# Patient Record
Sex: Male | Born: 1945 | Race: White | Hispanic: No | Marital: Married | Smoking: Never smoker
Health system: Southern US, Community
[De-identification: ages and names within clinical notes are randomized; demographics above are authoritative.]

## PROBLEM LIST (undated history)

## (undated) DIAGNOSIS — E119 Type 2 diabetes mellitus without complications: Secondary | ICD-10-CM

## (undated) DIAGNOSIS — I2510 Atherosclerotic heart disease of native coronary artery without angina pectoris: Secondary | ICD-10-CM

## (undated) DIAGNOSIS — I509 Heart failure, unspecified: Secondary | ICD-10-CM

## (undated) DIAGNOSIS — Q669 Congenital deformity of feet, unspecified, unspecified foot: Secondary | ICD-10-CM

## (undated) DIAGNOSIS — K760 Fatty (change of) liver, not elsewhere classified: Secondary | ICD-10-CM

## (undated) DIAGNOSIS — R600 Localized edema: Secondary | ICD-10-CM

## (undated) DIAGNOSIS — K219 Gastro-esophageal reflux disease without esophagitis: Secondary | ICD-10-CM

## (undated) DIAGNOSIS — G4730 Sleep apnea, unspecified: Secondary | ICD-10-CM

## (undated) DIAGNOSIS — E11319 Type 2 diabetes mellitus with unspecified diabetic retinopathy without macular edema: Secondary | ICD-10-CM

## (undated) DIAGNOSIS — I209 Angina pectoris, unspecified: Secondary | ICD-10-CM

## (undated) DIAGNOSIS — I10 Essential (primary) hypertension: Secondary | ICD-10-CM

## (undated) DIAGNOSIS — I495 Sick sinus syndrome: Secondary | ICD-10-CM

## (undated) DIAGNOSIS — E785 Hyperlipidemia, unspecified: Secondary | ICD-10-CM

## (undated) DIAGNOSIS — D649 Anemia, unspecified: Secondary | ICD-10-CM

## (undated) HISTORY — DX: Hyperlipidemia, unspecified: E78.5

## (undated) HISTORY — PX: CORONARY ANGIOPLASTY WITH STENT PLACEMENT: SHX49

---

## 2004-06-19 ENCOUNTER — Other Ambulatory Visit: Payer: Self-pay

## 2004-06-19 ENCOUNTER — Inpatient Hospital Stay: Payer: Self-pay | Admitting: Cardiology

## 2004-07-22 ENCOUNTER — Encounter: Payer: Self-pay | Admitting: Cardiology

## 2004-08-02 ENCOUNTER — Encounter: Payer: Self-pay | Admitting: Cardiology

## 2004-09-02 ENCOUNTER — Encounter: Payer: Self-pay | Admitting: Cardiology

## 2004-09-04 ENCOUNTER — Ambulatory Visit: Payer: Self-pay | Admitting: Family Medicine

## 2004-10-03 ENCOUNTER — Encounter: Payer: Self-pay | Admitting: Cardiology

## 2004-11-02 ENCOUNTER — Encounter: Payer: Self-pay | Admitting: Cardiology

## 2009-03-14 ENCOUNTER — Ambulatory Visit: Payer: Self-pay | Admitting: Unknown Physician Specialty

## 2009-08-20 ENCOUNTER — Ambulatory Visit: Payer: Self-pay | Admitting: Internal Medicine

## 2009-08-28 ENCOUNTER — Ambulatory Visit: Payer: Self-pay | Admitting: Family Medicine

## 2010-02-28 ENCOUNTER — Ambulatory Visit: Payer: Self-pay | Admitting: Unknown Physician Specialty

## 2012-02-18 ENCOUNTER — Ambulatory Visit: Payer: Self-pay | Admitting: Family Medicine

## 2012-03-05 ENCOUNTER — Ambulatory Visit: Payer: Self-pay | Admitting: Family Medicine

## 2012-03-24 ENCOUNTER — Ambulatory Visit: Payer: Self-pay | Admitting: Unknown Physician Specialty

## 2012-03-25 LAB — PATHOLOGY REPORT

## 2012-03-31 LAB — PSA: PSA: 0.3

## 2012-04-02 ENCOUNTER — Ambulatory Visit: Payer: Self-pay | Admitting: Family Medicine

## 2012-04-21 ENCOUNTER — Ambulatory Visit: Payer: Self-pay | Admitting: Unknown Physician Specialty

## 2012-06-06 ENCOUNTER — Ambulatory Visit: Payer: Self-pay | Admitting: Internal Medicine

## 2012-06-06 LAB — CK TOTAL AND CKMB (NOT AT ARMC)
CK, Total: 142 U/L (ref 35–232)
CK-MB: 2.9 ng/mL (ref 0.5–3.6)

## 2012-06-07 LAB — BASIC METABOLIC PANEL
Anion Gap: 6 — ABNORMAL LOW (ref 7–16)
BUN: 27 mg/dL — ABNORMAL HIGH (ref 7–18)
Calcium, Total: 8.8 mg/dL (ref 8.5–10.1)
Chloride: 100 mmol/L (ref 98–107)
Creatinine: 1.26 mg/dL (ref 0.60–1.30)
Osmolality: 273 (ref 275–301)

## 2012-07-14 ENCOUNTER — Ambulatory Visit: Payer: Self-pay | Admitting: Ophthalmology

## 2012-07-26 ENCOUNTER — Ambulatory Visit: Payer: Self-pay | Admitting: Ophthalmology

## 2012-09-07 ENCOUNTER — Ambulatory Visit: Payer: Self-pay | Admitting: Ophthalmology

## 2012-09-07 LAB — POTASSIUM: Potassium: 4.6 mmol/L (ref 3.5–5.1)

## 2012-09-20 ENCOUNTER — Ambulatory Visit: Payer: Self-pay | Admitting: Ophthalmology

## 2013-10-05 LAB — TSH: TSH: 1.23 u[IU]/mL (ref <=5.90)

## 2013-11-07 LAB — LIPID PANEL
Cholesterol: 105 mg/dL (ref 0–200)
HDL: 41 mg/dL (ref 35–70)
LDL Cholesterol: 39 mg/dL
Triglycerides: 126 mg/dL (ref 40–160)

## 2013-12-30 LAB — COMPREHENSIVE METABOLIC PANEL
ALBUMIN: 2.7 g/dL — AB (ref 3.4–5.0)
AST: 28 U/L (ref 15–37)
Alkaline Phosphatase: 102 U/L
Anion Gap: 11 (ref 7–16)
BUN: 52 mg/dL — ABNORMAL HIGH (ref 7–18)
Bilirubin,Total: 0.5 mg/dL (ref 0.2–1.0)
CALCIUM: 8 mg/dL — AB (ref 8.5–10.1)
CHLORIDE: 96 mmol/L — AB (ref 98–107)
Co2: 24 mmol/L (ref 21–32)
Creatinine: 2.51 mg/dL — ABNORMAL HIGH (ref 0.60–1.30)
GFR CALC AF AMER: 33 — AB
GFR CALC NON AF AMER: 27 — AB
GLUCOSE: 354 mg/dL — AB (ref 65–99)
Osmolality: 291 (ref 275–301)
POTASSIUM: 4.8 mmol/L (ref 3.5–5.1)
SGPT (ALT): 29 U/L
Sodium: 131 mmol/L — ABNORMAL LOW (ref 136–145)
TOTAL PROTEIN: 6.6 g/dL (ref 6.4–8.2)

## 2013-12-30 LAB — URINALYSIS, COMPLETE
BLOOD: NEGATIVE
Bacteria: NONE SEEN
Bilirubin,UR: NEGATIVE
Glucose,UR: 50 mg/dL (ref 0–75)
Hyaline Cast: 58
KETONE: NEGATIVE
LEUKOCYTE ESTERASE: NEGATIVE
Nitrite: NEGATIVE
PH: 5 (ref 4.5–8.0)
Protein: NEGATIVE
Specific Gravity: 1.017 (ref 1.003–1.030)

## 2013-12-30 LAB — CBC
HCT: 26.9 % — ABNORMAL LOW (ref 40.0–52.0)
HGB: 8.7 g/dL — AB (ref 13.0–18.0)
MCH: 27.4 pg (ref 26.0–34.0)
MCHC: 32.3 g/dL (ref 32.0–36.0)
MCV: 85 fL (ref 80–100)
PLATELETS: 267 10*3/uL (ref 150–440)
RBC: 3.17 10*6/uL — AB (ref 4.40–5.90)
RDW: 15.8 % — AB (ref 11.5–14.5)
WBC: 9 10*3/uL (ref 3.8–10.6)

## 2013-12-30 LAB — PRO B NATRIURETIC PEPTIDE: B-Type Natriuretic Peptide: 807 pg/mL — ABNORMAL HIGH (ref 0–125)

## 2013-12-30 LAB — TROPONIN I: Troponin-I: <0.02 ng/mL

## 2013-12-31 ENCOUNTER — Inpatient Hospital Stay: Payer: Self-pay | Admitting: Internal Medicine

## 2013-12-31 LAB — BASIC METABOLIC PANEL
ANION GAP: 9 (ref 7–16)
BUN: 55 mg/dL — AB (ref 7–18)
Calcium, Total: 7.8 mg/dL — ABNORMAL LOW (ref 8.5–10.1)
Chloride: 97 mmol/L — ABNORMAL LOW (ref 98–107)
Co2: 26 mmol/L (ref 21–32)
Creatinine: 2.4 mg/dL — ABNORMAL HIGH (ref 0.60–1.30)
EGFR (African American): 35 — ABNORMAL LOW
EGFR (Non-African Amer.): 29 — ABNORMAL LOW
Glucose: 433 mg/dL — ABNORMAL HIGH (ref 65–99)
OSMOLALITY: 298 (ref 275–301)
POTASSIUM: 5.2 mmol/L — AB (ref 3.5–5.1)
SODIUM: 132 mmol/L — AB (ref 136–145)

## 2013-12-31 LAB — URINALYSIS, COMPLETE
BILIRUBIN, UR: NEGATIVE
Blood: NEGATIVE
KETONE: NEGATIVE
Leukocyte Esterase: NEGATIVE
Nitrite: NEGATIVE
PH: 5 (ref 4.5–8.0)
Protein: NEGATIVE
Specific Gravity: 1.01 (ref 1.003–1.030)

## 2013-12-31 LAB — CBC WITH DIFFERENTIAL/PLATELET
BASOS ABS: 0 10*3/uL (ref 0.0–0.1)
Basophil %: 0.4 %
EOS PCT: 1.6 %
Eosinophil #: 0.2 10*3/uL (ref 0.0–0.7)
HCT: 27 % — ABNORMAL LOW (ref 40.0–52.0)
HGB: 8.5 g/dL — ABNORMAL LOW (ref 13.0–18.0)
Lymphocyte #: 1.2 10*3/uL (ref 1.0–3.6)
Lymphocyte %: 12.2 %
MCH: 26.8 pg (ref 26.0–34.0)
MCHC: 31.5 g/dL — ABNORMAL LOW (ref 32.0–36.0)
MCV: 85 fL (ref 80–100)
Monocyte #: 1.5 x10 3/mm — ABNORMAL HIGH (ref 0.2–1.0)
Monocyte %: 15.7 %
NEUTROS ABS: 6.8 10*3/uL — AB (ref 1.4–6.5)
Neutrophil %: 70.1 %
Platelet: 261 10*3/uL (ref 150–440)
RBC: 3.18 10*6/uL — ABNORMAL LOW (ref 4.40–5.90)
RDW: 15.8 % — ABNORMAL HIGH (ref 11.5–14.5)
WBC: 9.6 10*3/uL (ref 3.8–10.6)

## 2013-12-31 LAB — CK-MB
CK-MB: 2.3 ng/mL (ref 0.5–3.6)
CK-MB: 2 ng/mL (ref 0.5–3.6)
CK-MB: 2.6 ng/mL (ref 0.5–3.6)

## 2013-12-31 LAB — TROPONIN I: Troponin-I: 0.02 ng/mL

## 2013-12-31 LAB — TSH: THYROID STIMULATING HORM: 0.894 u[IU]/mL

## 2014-01-01 LAB — CBC WITH DIFFERENTIAL/PLATELET
BASOS ABS: 0 10*3/uL (ref 0.0–0.1)
Basophil %: 0.4 %
EOS PCT: 2.4 %
Eosinophil #: 0.3 10*3/uL (ref 0.0–0.7)
HCT: 26.5 % — AB (ref 40.0–52.0)
HGB: 8.5 g/dL — AB (ref 13.0–18.0)
LYMPHS ABS: 1.6 10*3/uL (ref 1.0–3.6)
Lymphocyte %: 15.2 %
MCH: 26.9 pg (ref 26.0–34.0)
MCHC: 31.9 g/dL — ABNORMAL LOW (ref 32.0–36.0)
MCV: 84 fL (ref 80–100)
Monocyte #: 1.5 x10 3/mm — ABNORMAL HIGH (ref 0.2–1.0)
Monocyte %: 13.6 %
Neutrophil #: 7.4 10*3/uL — ABNORMAL HIGH (ref 1.4–6.5)
Neutrophil %: 68.4 %
Platelet: 278 10*3/uL (ref 150–440)
RBC: 3.15 10*6/uL — AB (ref 4.40–5.90)
RDW: 15.9 % — AB (ref 11.5–14.5)
WBC: 10.8 10*3/uL — ABNORMAL HIGH (ref 3.8–10.6)

## 2014-01-01 LAB — BASIC METABOLIC PANEL
Anion Gap: 12 (ref 7–16)
BUN: 67 mg/dL — ABNORMAL HIGH (ref 7–18)
Calcium, Total: 7.8 mg/dL — ABNORMAL LOW (ref 8.5–10.1)
Chloride: 98 mmol/L (ref 98–107)
Co2: 25 mmol/L (ref 21–32)
Creatinine: 2.71 mg/dL — ABNORMAL HIGH (ref 0.60–1.30)
EGFR (African American): 30 — ABNORMAL LOW
EGFR (Non-African Amer.): 25 — ABNORMAL LOW
GLUCOSE: 273 mg/dL — AB (ref 65–99)
Osmolality: 299 (ref 275–301)
POTASSIUM: 4.7 mmol/L (ref 3.5–5.1)
Sodium: 135 mmol/L — ABNORMAL LOW (ref 136–145)

## 2014-01-02 LAB — HEMOGLOBIN: HGB: 7.9 g/dL — ABNORMAL LOW (ref 13.0–18.0)

## 2014-01-02 LAB — IRON AND TIBC
IRON BIND. CAP.(TOTAL): 401 ug/dL (ref 250–450)
IRON SATURATION: 12 %
IRON: 47 ug/dL — AB (ref 65–175)
UNBOUND IRON-BIND. CAP.: 354 ug/dL

## 2014-01-02 LAB — BASIC METABOLIC PANEL
ANION GAP: 8 (ref 7–16)
BUN: 62 mg/dL — AB (ref 7–18)
CHLORIDE: 101 mmol/L (ref 98–107)
Calcium, Total: 7.9 mg/dL — ABNORMAL LOW (ref 8.5–10.1)
Co2: 27 mmol/L (ref 21–32)
Creatinine: 1.96 mg/dL — ABNORMAL HIGH (ref 0.60–1.30)
EGFR (Non-African Amer.): 36 — ABNORMAL LOW
GFR CALC AF AMER: 44 — AB
GLUCOSE: 152 mg/dL — AB (ref 65–99)
OSMOLALITY: 293 (ref 275–301)
Potassium: 4.2 mmol/L (ref 3.5–5.1)
Sodium: 136 mmol/L (ref 136–145)

## 2014-01-02 LAB — FERRITIN: FERRITIN (ARMC): 17 ng/mL (ref 8–388)

## 2014-01-02 LAB — PHOSPHORUS: Phosphorus: 3.1 mg/dL (ref 2.5–4.9)

## 2014-01-03 LAB — PROTEIN ELECTROPHORESIS(ARMC)

## 2014-01-05 DIAGNOSIS — I10 Essential (primary) hypertension: Secondary | ICD-10-CM | POA: Insufficient documentation

## 2014-01-05 DIAGNOSIS — N184 Chronic kidney disease, stage 4 (severe): Secondary | ICD-10-CM | POA: Insufficient documentation

## 2014-01-05 DIAGNOSIS — I350 Nonrheumatic aortic (valve) stenosis: Secondary | ICD-10-CM | POA: Insufficient documentation

## 2014-01-05 LAB — UR PROT ELECTROPHORESIS, URINE RANDOM

## 2014-02-28 ENCOUNTER — Ambulatory Visit: Payer: Self-pay | Admitting: Unknown Physician Specialty

## 2014-02-28 LAB — HM COLONOSCOPY

## 2014-03-01 DIAGNOSIS — I480 Paroxysmal atrial fibrillation: Secondary | ICD-10-CM | POA: Insufficient documentation

## 2014-03-29 LAB — HEMOGLOBIN A1C: Hgb A1c MFr Bld: 11.9 % — AB (ref 4.0–6.0)

## 2014-05-21 LAB — CBC AND DIFFERENTIAL
HEMATOCRIT: 41 % (ref 41–53)
Hemoglobin: 13.4 g/dL — AB (ref 13.5–17.5)
PLATELETS: 201 10*3/uL (ref 150–399)
WBC: 9.7 10^3/mL

## 2014-05-21 LAB — BASIC METABOLIC PANEL
BUN: 28 mg/dL — AB (ref 4–21)
Creatinine: 1.2 mg/dL (ref <=1.3)
GLUCOSE: 293 mg/dL
Potassium: 4.3 mmol/L (ref 3.4–5.3)
Sodium: 136 mmol/L — AB (ref 137–147)

## 2014-05-23 DIAGNOSIS — E11319 Type 2 diabetes mellitus with unspecified diabetic retinopathy without macular edema: Secondary | ICD-10-CM | POA: Insufficient documentation

## 2014-05-23 DIAGNOSIS — I10 Essential (primary) hypertension: Secondary | ICD-10-CM | POA: Insufficient documentation

## 2014-05-23 DIAGNOSIS — K760 Fatty (change of) liver, not elsewhere classified: Secondary | ICD-10-CM | POA: Insufficient documentation

## 2014-05-23 DIAGNOSIS — K219 Gastro-esophageal reflux disease without esophagitis: Secondary | ICD-10-CM | POA: Insufficient documentation

## 2014-05-23 DIAGNOSIS — N179 Acute kidney failure, unspecified: Secondary | ICD-10-CM | POA: Insufficient documentation

## 2014-05-23 DIAGNOSIS — L570 Actinic keratosis: Secondary | ICD-10-CM | POA: Insufficient documentation

## 2014-05-23 DIAGNOSIS — I2510 Atherosclerotic heart disease of native coronary artery without angina pectoris: Secondary | ICD-10-CM | POA: Insufficient documentation

## 2014-05-23 DIAGNOSIS — D509 Iron deficiency anemia, unspecified: Secondary | ICD-10-CM | POA: Insufficient documentation

## 2014-05-23 DIAGNOSIS — R945 Abnormal results of liver function studies: Secondary | ICD-10-CM | POA: Insufficient documentation

## 2014-05-23 DIAGNOSIS — F339 Major depressive disorder, recurrent, unspecified: Secondary | ICD-10-CM | POA: Insufficient documentation

## 2014-05-23 DIAGNOSIS — D649 Anemia, unspecified: Secondary | ICD-10-CM | POA: Insufficient documentation

## 2014-05-23 DIAGNOSIS — G4733 Obstructive sleep apnea (adult) (pediatric): Secondary | ICD-10-CM | POA: Insufficient documentation

## 2014-05-23 DIAGNOSIS — Z967 Presence of other bone and tendon implants: Secondary | ICD-10-CM | POA: Insufficient documentation

## 2014-05-23 DIAGNOSIS — E669 Obesity, unspecified: Secondary | ICD-10-CM | POA: Insufficient documentation

## 2014-05-23 DIAGNOSIS — G5630 Lesion of radial nerve, unspecified upper limb: Secondary | ICD-10-CM | POA: Insufficient documentation

## 2014-05-23 DIAGNOSIS — L400 Psoriasis vulgaris: Secondary | ICD-10-CM | POA: Insufficient documentation

## 2014-05-23 DIAGNOSIS — E785 Hyperlipidemia, unspecified: Secondary | ICD-10-CM | POA: Insufficient documentation

## 2014-05-23 DIAGNOSIS — R7989 Other specified abnormal findings of blood chemistry: Secondary | ICD-10-CM | POA: Insufficient documentation

## 2014-05-25 NOTE — Consult Note (Signed)
Brief Consult Note: Diagnosis: SVT/Tachycardia.   Patient was seen by consultant.   Consult note dictated.   Orders entered.   Discussed with Attending MD.   Comments: IMP Tachycardia Palptations SVT (accelarated junctional rhythm) 115 HTN CAD DM Obesity Hyperlipidemia COPD OSA . PLAN Metoprolol 5mg  IV Metoprolol tart 25 mg po x 1 Continue DM control HTN control Continue Statin OK to d/c home F/U with BK/Donna Carrol.  Electronic Signatures: Dorothyann Peng D (MD)  (Signed 24-Jun-14 12:50)  Authored: Brief Consult Note   Last Updated: 24-Jun-14 12:50 by Dorothyann Peng D (MD)

## 2014-05-25 NOTE — Discharge Summary (Signed)
PATIENT NAME:  LARELL, KULPA MR#:  [id number] DATE OF BIRTH:  1945-08-03  DATE OF ADMISSION:  06/06/2012 DATE OF DISCHARGE:  06/07/2012  DATE OF BIRTH: 07/28/1934.   DISCHARGE DIAGNOSES:   1.  Hypertension.  2.  Hyperlipidemia.  3.  Coronary artery disease.   HISTORY: This is a 69 year old male with chest discomfort, progressive in nature, with known coronary artery disease, having a new onset of worsening chest discomfort and an abnormal stress test. The patient had [place] class III anginal equivalent. The patient was on appropriate medication management. He therefore underwent cardiac catheterization showing a significant restenosis of the proximal LAD stent as well as worsening 95% stenosis of the proximal LAD. The patient underwent PCI and stent placement of this area without complication using a drug-eluting stent.   He has reached his maximal hospital benefit and discharged home in good condition. The patient is to follow up for further evaluation and treatment in 2 weeks.   DISCHARGE MEDICATIONS: Effient 10 mg each day, aspirin 81 mg each day, lisinopril  10 mg each day, metoprolol 25 mg b.i.d., with simvastatin 40 mg each day.    ____________________________ Lamar Blinks, MD bjk:dm D: 06/09/2012 13:58:25 ET T: 06/09/2012 14:08:13 ET JOB#: [id number]  cc: Lamar Blinks, MD, <Dictator> Lamar Blinks MD ELECTRONICALLY SIGNED 06/13/2012 13:47

## 2014-05-25 NOTE — Op Note (Signed)
PATIENT NAME:  Jay [name], Jay [name] MR#:  [id number] DATE OF BIRTH:  [birth year]  DATE OF PROCEDURE:  07/26/2012  PREOPERATIVE DIAGNOSIS: Visually significant cataract of the right eye.   POSTOPERATIVE DIAGNOSIS: Visually significant cataract of the right eye.   OPERATIVE PROCEDURE: Cataract extraction by phacoemulsification with implant of intraocular lens to the right eye.   SURGEON: Galen Manila, MD.   ANESTHESIA:  1. Managed anesthesia care.  2. Topical tetracaine drops followed by 2% Xylocaine jelly applied in the preoperative holding area.   COMPLICATIONS: None.   TECHNIQUE: Stop and chop.  DESCRIPTION OF PROCEDURE: The patient was examined and consented in the preoperative holding area where the aforementioned topical anesthesia was applied to the right eye and then brought back to the operating room where the right eye was prepped and draped in the usual sterile ophthalmic fashion and a lid speculum was placed. A paracentesis was created with the side port blade and the anterior chamber was filled with viscoelastic. A near clear corneal incision was performed with the steel keratome. A continuous curvilinear capsulorrhexis was performed with a cystotome followed by the capsulorrhexis forceps. Hydrodissection and hydrodelineation were carried out with BSS on a blunt cannula. The lens was removed in a stop and chop technique and the remaining cortical material was removed with the irrigation-aspiration handpiece. The capsular bag was inflated with viscoelastic and the Tecnis ZCB00 22.0-diopter lens, serial number [id number] was placed in the capsular bag without complication. The remaining viscoelastic was removed from the eye with the irrigation-aspiration handpiece. The wounds were hydrated. The anterior chamber was flushed with Miostat and the eye was inflated to physiologic pressure. 0.1 mL of cefuroxime concentration 10 mg/mL was placed in the anterior chamber. The wounds were found to  be water tight. The eye was dressed with Vigamox. The patient was given protective glasses to wear throughout the day and a shield with which to sleep tonight. The patient was also given drops with which to begin a drop regimen today and will follow-up with me in 1 day.    ____________________________ Jerilee Field. Shiheem Corporan, MD wlp:aw D: 07/26/2012 13:01:00 ET T: 07/26/2012 13:18:58 ET JOB#: [id number]  cc: Minette Manders L. Morry Veiga, MD, <Dictator> Jerilee Field [place] MD ELECTRONICALLY SIGNED 07/28/2012 9:44

## 2014-05-25 NOTE — Op Note (Signed)
PATIENT NAME:  Jay [name], Jay [name] MR#:  [id number] DATE OF BIRTH:  October 30, 1945  DATE OF PROCEDURE:  09/20/2012  PREOPERATIVE DIAGNOSIS: Visually significant cataract of the left eye.   POSTOPERATIVE DIAGNOSIS: Visually significant cataract of the left eye.   OPERATIVE PROCEDURE: Cataract extraction by phacoemulsification with implant of intraocular lens to left eye.   SURGEON: Galen Manila, MD.   ANESTHESIA:  1. Managed anesthesia care.  2. Topical tetracaine drops followed by 2% Xylocaine jelly applied in the preoperative holding area.   COMPLICATIONS: None.   TECHNIQUE:  Stop and chop.   DESCRIPTION OF PROCEDURE: The patient was examined and consented in the preoperative holding area where the aforementioned topical anesthesia was applied to the left eye and then brought back to the Operating Room where the left eye was prepped and draped in the usual sterile ophthalmic fashion and a lid speculum was placed. A paracentesis was created with the side port blade and the anterior chamber was filled with viscoelastic. A near clear corneal incision was performed with the steel keratome. A continuous curvilinear capsulorrhexis was performed with a cystotome followed by the capsulorrhexis forceps. Hydrodissection and hydrodelineation were carried out with BSS on a blunt cannula. The lens was removed in a stop and chop  technique and the remaining cortical material was removed with the irrigation-aspiration handpiece. The capsular bag was inflated with viscoelastic and the Tecnis ZCB00 22.5 diopter lens, serial number [id number] was placed in the capsular bag without complication. The remaining viscoelastic was removed from the eye with the irrigation-aspiration handpiece. The wounds were hydrated. The anterior chamber was flushed with Miostat and the eye was inflated to physiologic pressure. 0.1 mL of cefuroxime concentration 10 mg/mL was placed in the anterior chamber. The wounds were found to be  water tight. The eye was dressed with Vigamox. The patient was given protective glasses to wear throughout the day and a shield with which to sleep tonight. The patient was also given drops with which to begin a drop regimen today and will follow-up with me in one day.     ____________________________ Jerilee Field. Jaquel Glassburn, MD wlp:cc D: 09/20/2012 14:25:43 ET T: 09/20/2012 17:55:53 ET JOB#: [id number]  cc: Levaughn Puccinelli L. Nickolaos Brallier, MD, <Dictator> Jerilee Field Miley Blanchett MD ELECTRONICALLY SIGNED 09/21/2012 13:37

## 2014-05-26 NOTE — Consult Note (Signed)
PATIENT NAME:  Jay [name], ELTING MR#:  [id number] DATE OF BIRTH:  Sep 07, 1945  DATE OF CONSULTATION:  01/01/2014  REQUESTING PHYSICIAN:   Dr. Ellin Mayhew CONSULTING PHYSICIAN:  Lamar Blinks, MD   REASON FOR CONSULTATION: Supraventricular tachycardia, as well as bradycardia with first degree AV block.   CHIEF COMPLAINT: The patient has dizziness and weakness.   HISTORY OF PRESENT ILLNESS: This is a 69 year old male with known essential hypertension, mixed hyperlipidemia, diabetes with complication of cardiovascular disease and renal dysfunction, coronary artery disease status post multiple coronary artery stenting, sleep apnea and diabetes, who has had recent onset of dizziness for the last week.  The patient has had severe generalized weakness, dry mouth with reasonable urine output with an increase in frequency and intensity of this dizziness, but no evidence of syncope not specifically relieved by anything in particular.  Upon arrival, the patient does have an EKG showing sinus bradycardia with first degree AV block in the 40 beat per minute range with some lower blood pressure, but this has slightly improved.  He also, by telemetry has had a short run of supraventricular tachycardia, wide complex of approximately 15 beats. The patient has had this supraventricular tachycardia in the past and has been on a beta blocker in the past but now this has been discontinued.  He has had coronary artery disease with stenting of the left anterior descending artery, right coronary artery in the past, but currently there is no evidence of true angina and/or congestive heart failure.   Troponin level has been normal. There has been sleep apnea for which the patient has not been as diligent as needed for a CPAP machine.  Chronic kidney disease has been exacerbated from diabetes and stage IV for which the patient has had appropriate treatment. He does have lower extremity edema on diuretics with GFR of 30 and a  creatinine of 2.71. Currently, there is anemia, which may be exacerbating some of her symptoms as well, with a hemoglobin of 8.5.  The patient does have mixed hyperlipidemia for which he has been on Crestor and stable.   REVIEW OF SYSTEMS: The remainder review of systems negative for vision change, ringing in the ears, hearing loss, cough, congestion, heartburn, nausea, vomiting, diarrhea, bloody stools, stomach pain, extremity pain, leg weakness, cramping of the buttocks, known blood clots, headaches, nosebleeds, congestion, trouble swallowing, frequent urination, urination at night, muscle weakness, numbness, anxiety, depression, skin lesions or skin rashes.   PAST MEDICAL HISTORY:  1.  Supraventricular tachycardia.  2.  Bradycardia.  3.  Coronary artery disease without angina/ 4.  Sleep apnea, obstructive.  5.  Diabetes with complications of cardiovascular disease and chronic kidney disease.  6.  Mixed hyperlipidemia.  7.  Chronic kidney disease stage IV.   FAMILY HISTORY: Multiple family members with early onset of cardiovascular disease including his father and mother with heart attack and stroke.   SOCIAL HISTORY: He currently denies alcohol or tobacco use.   ALLERGIES: As listed.   MEDICATIONS: As listed.   PHYSICAL EXAMINATION:  VITAL SIGNS: Blood pressure is 101/51 bilaterally. Heart rate is 48 upright, reclining, and regular.  GENERAL: He is a well-appearing large male in no acute distress.  HEENT: No icterus, thyromegaly, ulcers, hemorrhage, or xanthelasma.  CARDIOVASCULAR: Regular rate and rhythm. Distant heart sounds. No apparent murmur, gallop, or rub.  PMI is diffuse.  Carotid upstroke cannot be seen. Jugular venous pressure cannot be seen or heard.  LUNGS: Decreased breath sounds in the bases and  a few wheezes.  ABDOMEN: Soft, nontender. Cannot assess hepatosplenomegaly or masses due to increased abdominal girth.  EXTREMITIES: Showed 2+ radial, no femoral and trace dorsal  pedal pulses, with 1+ lower extremity edema. No cyanosis, clubbing or ulcers.  NEUROLOGIC: The patient is oriented to time, place, and person, with normal mood and affect.   ASSESSMENT: This is a 69 year old male with supraventricular tachycardia, followed by an episode of bradycardia with first degree AV block, coronary artery disease without heart failure or angina, obstructive sleep apnea, mixed hyperlipidemia, diabetes with complications, chronic kidney disease with some dizziness needing further treatment options.   RECOMMENDATIONS:  1.  Discontinuation of metoprolol and no current additional medication management for blood pressure which is currently stable which there are concerns of hypotension causing or exacerbating above.  2.  Further evaluation of anemia which could contribute to dizziness and current symptoms.  3.  Echocardiogram for LV systolic dysfunction, valvular heart disease contributing to above.  4.  Continue aspirin for further risk reduction in cardiovascular event and/or in-stent thrombosis, although potentially may discontinue Plavix due to concerns of bleeding and anemia.  5.  Crestor for high intensity cholesterol therapy.  6.  Begin ambulation with the above following closely for evidence of recurrent supraventricular tachycardia or symptomatic bradycardia requiring pacemaker placement and the patient understands the risks and benefits of pacemaker placement. This includes the possibility of death, stroke, heart attack, infection, bleeding, blood clot, pneumothorax, hemopericardium, and other rhythm disturbances.  The patient is at low risk for conscious sedation.     ____________________________ Lamar Blinks, MD bjk:DT D: 01/01/2014 12:41:13 ET T: 01/01/2014 12:57:14 ET JOB#: [id number]  cc: Lamar Blinks, MD, <Dictator> Lamar Blinks MD ELECTRONICALLY SIGNED 01/05/2014 9:02

## 2014-05-26 NOTE — H&P (Signed)
PATIENT NAME:  Jay [name], Jay [name] MR#:  [id number] DATE OF BIRTH:  1945-10-24  DATE OF ADMISSION:  12/30/2013  PRIMARY CARE PHYSICIAN: Amir L. Sullivan Lone, MD   REFERRING PHYSICIAN: Bobetta Lime A. Inocencio Homes, MD   CHIEF COMPLAINT: Dizziness.   HISTORY OF PRESENT ILLNESS: Jay [name] is a 69 year old, morbidly obese male with a history of hypertension, hyperlipidemia, diabetes mellitus, coronary artery disease status post stent placement, has been experiencing dizziness for the last 1 week. The patient also has been experiencing severe generalized weakness, dry mouth. The patient was urinating well until 2 days back, noticed to have decreased urine output. As the symptoms were getting worse, came to the Emergency Department. Workup in the Emergency Department, the patient is noted to have acute renal failure of 2.5, elevated blood sugars of 350, hemoglobin of 8.7; we do not have any baseline. The patient is also found to have a hemoglobin of 8.7; we do not have the patient's baseline. CT, head, without contrast is unremarkable. EKG shows sinus bradycardia with a heart rate in the 40s. The patient denies having any chest pain or lower extremity swelling.   PAST MEDICAL HISTORY:  1.  Obesity.  2.  Coronary artery disease status post stent placement.  3.  Hypertension.  4.  Hyperlipidemia.  6.  Gastroesophageal reflux disease. 7.  Diabetes mellitus, insulin-dependent.  8.  Obstructive sleep apnea.   ALLERGIES: METFORMIN.   HOME MEDICATIONS:  1.  Omeprazole 20 mg once a day.  2.  Nitroglycerin 0.4 mg sublingual as needed.  3.  Metoprolol 25 mg 2 times a day.  4.  Lantus 72 units with breakfast and 80 units at bedtime. 5.  Januvia 100 mg once a day.  6.  Imdur 30 mg once a day.  7.  Hydrochlorothiazide and lisinopril 1 tablet once a day.  8.  Humalog 62 units with supper.  9.  Glimepiride 4 mg 2 tablets once a day.  10.  Lasix 40 mg once a day. 11.  Crestor 10 mg daily.  12.  Plavix 1 tablet once a  day. 13.  Aspirin 81 mg daily.   SOCIAL HISTORY: No history of smoking, drinking alcohol, or using illicit drugs. Married, lives with his wife. Wife is bed-bound, patient is the primary caregiver.  FAMILY HISTORY: Both parents died in their 71s and 26s from heart problems.   REVIEW OF SYSTEMS:  CONSTITUTIONAL: Experiencing severe generalized weakness.  EYES: No change in vision.  ENT: No change in hearing.   RESPIRATORY: Has no cough. Has shortness of breath with exertion.  CARDIOVASCULAR: Dizziness.  GASTROINTESTINAL: No nausea or vomiting. Has been having diarrhea.  GENITOURINARY: Decreased urine output.  MUSCULOSKELETAL: Has osteoarthritis.  NEUROLOGIC: No weakness or numbness in any part of the body.   PHYSICAL EXAMINATION:  GENERAL: This is a well-built, well-nourished, obese male who looks older than his staged age, lying down in the bed, not in distress.  VITAL SIGNS: Temperature 97.1, pulse 47, blood pressure 112/43, respiratory rate of 20, oxygen saturation is 100% on 2 L of oxygen.  HEENT: Head normocephalic, atraumatic. There is no scleral icterus. Conjunctivae normal. Pupils equal and reactive to light. Mucous membranes dry. No pharyngeal erythema.  NECK: Supple. No lymphadenopathy. No JVD. No carotid bruit.  CHEST: There is no focal tenderness. Decreased breath sounds in the lower lobes.  HEART: S1, S2 regular. No murmurs are heard.  ABDOMEN: Bowel sounds plus. Soft, nontender, nondistended, obese abdomen. No hepatosplenomegaly.  EXTREMITIES: No pedal edema. Pulses 2+.  SKIN: No rash or lesions.  MUSCULOSKELETAL: Good range of motion of all extremities.   NEUROLOGIC: Patient is alert, oriented to place, person, and time. Cranial nerves II through XII intact. Motor is 5/5 in upper and lower extremities.   LABORATORY DATA: Urinalysis negative for nitrites and leukocyte esterase. CT, head, without contrast: No acute intracranial abnormality. Chest x-ray, 1 view portable:  Cardiomegaly. CMP: BUN 52, creatinine of 2.51, sodium 131. The rest of all the values are within normal limits. Hemoglobin 8.7. Troponin less than 0.02. BNP 807.   EKG, 12-lead: Sinus bradycardia with first-degree AV block.    ASSESSMENT AND PLAN: Jay [name], a 69 year old male, comes with multiple abnormalities.  1.  Dizziness: This seems to be a combination of causes: Dehydration; sinus bradycardia, symptomatic. Hold the metoprolol. CT, head, is without contrast: No abnormalities. No obvious any neurological deficits. Will also check orthostatic blood pressure. Will keep the patient on normal saline at 75 mL/hr. 2.  Acute renal failure: This is a combination of hydrochlorothiazide and lisinopril. Hold both the medications. Continue with intravenous fluids and follow up.  3.  Anemia: The patient has a hemoglobin of 8.7. The patient has a history of chronic kidney disease. This could be contributing to low hemoglobin. Will transfuse 1 unit of packed red blood cells; however, this does not seem to be cause of his dizziness. Will do the workup with iron profile, B12, folate RBC. The patient states had a colonoscopy 2 years back, showed some polyps. Denies having any blood in the stool.  4.  Debility: Will involve with physical therapy, occupational therapy.  5.  Diabetes mellitus, poorly controlled: Will obtain hemoglobin A1c. The patient is on high-dose insulin. Will follow up.  6.  Deep vein thrombosis prophylaxis with heparin.   TIME SPENT: 55 minutes.   ____________________________ Susa Griffins, MD pv:ST D: 12/30/2013 23:14:58 ET T: 12/30/2013 23:40:11 ET JOB#: [id number]  cc: Susa Griffins, MD, <Dictator> Jayzen L. Sullivan Lone, MD Clerance Lav Thatcher Doberstein MD ELECTRONICALLY SIGNED 01/12/2014 21:29

## 2014-05-26 NOTE — Discharge Summary (Signed)
PATIENT NAME:  Jay [name], [place]ULEN[place]G MR#:  [id number] DATE OF BIRTH:  Feb 09, 1945  DATE OF ADMISSION:  12/31/2013 DATE OF DISCHARGE:  01/02/2014  ADMISSION DIAGNOSES:   1.  Dizziness.  2.  Bradycardia.  3.  Acute on chronic iron deficiency anemia.  4.  Acute renal failure.   CONSULTATIONS:  1.  Arnoldo Hooker, MD  2.  Munsoor Lateef, MD   LABORATORIES AT DISCHARGE:  Sodium 136, potassium 4.2, chloride 101, bicarbonate 27, BUN 62, creatinine 1.96, glucose is 152. Ferritin 17, iron saturation 12. Iron serum 47, TIBC 401, UIBC 354, phosphorus 3.1, hemoglobin is 7.9.   A 2-D echocardiogram showed normal ejection fraction of 55% to 60% with mild mitral valve regurgitation. Mild to moderate aortic valve stenosis.   Renal ultrasound was normal.   BRIEF HOSPITAL COURSE:  A 69 year old male presenting with dizziness, found to have bradycardia and acute renal failure. For further details, please refer to the H and P.  1.  Dizziness. I suspect this is a combination of bradycardia also anemia, acute on chronic.  The patient is no longer dizzy.  He is ambulating without any symptoms. We held his beta blocker and his heart rate has subsequently improved.  2.  Acute renal failure, thought to be medication-induced.  His hydrochlorothiazide and ACE inhibitor are on hold.  He had acute renal failure secondary to ATN.  His creatinine is improving. Renal ultrasound was normal. No evidence of hydronephrosis. The patient will follow up with Dr. Cherylann Ratel, the nephrologist who saw me in consultation in the hospital in 1 week. 3.  Hyperkalemia resolved.  This was secondary to problem #2.  4.  Bradycardia; we are holding metoprolol and heart rate has improved. Cardiology consult was appreciated. Echo showed normal ejection fraction. No major valvular disease. 5.   Anemia, iron deficient acute on chronic.  The patient's hemoglobin did drop to 7.9.  He had a colonoscopy about 2 years ago which he says his normal.  He has  laboratories consistent with iron deficiency. He says he is taking iron supplement at home.  He is also complaining of dyspnea on exertion and fatigue, so we will go ahead and transfuse him 1 unit prior to discharge.  6.  Hyponatremia from dehydration and Lasix.  This has improved with IV fluids, holding Lasix. 7.  History of coronary artery disease. The patient will continue aspirin and/or Plavix. 8.  Diabetes. The patient will continue with his insulin and ADA diet.  9.  There are no mention of complication.  10. Obstructive sleep apnea.  The patient is not compliant with his CPAP and discussed that he should follow-up with a dentist possibly for a mouth guard.  This may help him since he does not like the CPAP machine.   DISCHARGE MEDICATIONS:  1.  Omeprazole 20 mg daily. 2.  Plavix 75 mg daily.  3.  Symbicort daily.  4.  Imdur 30 mg daily.  5.  Aspirin 81 mg daily. 6.  Crestor 10 mg daily.  7.  Nitroglycerin sublingual p.r.n. chest pain.  8.  TriCor 1 tablet daily.  9.  Glimepiride 8 mg daily 10. InsulinLevemir  80 units at bedtime.  11. Insulin Levemir 72 units daily.  12. Tradjenta 5 mg daily.  13. Ferrous sulfate 325 b.i.d.   DISCHARGE DIET: Low sodium.   DISCHARGE ACTIVITY: As tolerated.   DISCHARGE FOLLOW-UP:  The patient will follow up with Dr. Cherylann Ratel in 1 week, Dr. Gwen Pounds in 1 week and Dr. Gerlene Burdock  Sullivan Lone in 1 week.    TIME SPENT: Approximately 35 minutes.     ____________________________ Janyth Contes. Juliene Pina, MD spm:DT D: 01/02/2014 12:29:01 ET T: 01/02/2014 13:44:27 ET JOB#: [id number]  cc: Elyan Vanwieren P. Juliene Pina, MD, <Dictator> Madex L. Sullivan Lone, MD Lamar Blinks, MD Munsoor Lizabeth Leyden, MD Janyth Contes Kenza Munar MD ELECTRONICALLY SIGNED 01/02/2014 20:46

## 2014-06-03 NOTE — Consult Note (Signed)
PATIENT NAME:  Jay [name], Jay [name] MR#:  [id number] DATE OF BIRTH:  [birth year]  DATE OF CONSULTATION:  02/28/2014  REFERRING PHYSICIAN:  Scot Jun, MD CONSULTING PHYSICIAN:  Lamar Blinks, MD  REASON FOR CONSULTATION: Atrial fibrillation.   CHIEF COMPLAINT: Shortness of breath.  HISTORY OF PRESENT ILLNESS: This is a 69 year old male with moderate aortic valve stenosis, hypertension, hyperlipidemia and coronary artery disease, for which he has had a colonoscopy. The patient had a colonoscopy, and had some AVMs needed some cautery. The patient has had done fairly well, other than the fact that he has had new onset of supraventricular tachycardia, consistent with atrial fibrillation with rapid ventricular rate. The patient had previously been on a beta blocker for this issue, and has had an episode for which he had bradycardia, and this was discontinued. Due to that issue, and his current stressful condition, he has atrial fibrillation with rapid ventricular rate. He has had some short runs where he has had some spontaneous conversion back to normal rhythm with preventricular contractions. Currently, he is asymptomatic. The remainder of the review of systems is negative for vision change, ringing in the ears, hearing loss, cough, congestion, heartburn, nausea, vomiting, diarrhea, stomach pain, extremity pain, leg weakness, cramping of the buttocks, known blood clots, headaches, blackouts, dizzy spells, nosebleeds, congestion, trouble swallowing, frequent urination, urination at night, muscle weakness, numbness, anxiety, depression, skin lesions or skin rashes.   PAST MEDICAL HISTORY: Essential hypertension, mixed hyperlipidemia, moderate aortic stenosis and coronary artery disease.   PHYSICAL EXAMINATION:  VITAL SIGNS: Blood pressure is 132/68, bilateral. Heart rate is 110 and irregular.  GENERAL: He is a well-appearing male, in no acute distress.  HEAD, EYES, EARS, NOSE, THROAT: No icterus,  thyromegaly, ulcers, hemorrhage, or xanthelasma.  CARDIOVASCULAR: Irregularly irregular, with normal S1 and a soft S2, with a 3/6 right upper sternal border murmur radiating throughout. PMI is inferiorly displaced. Carotid upstroke normal with murmur and radiation. Jugular venous pressure not seen.  LUNGS: Clear to auscultation with normal respirations.  ABDOMEN: Soft, nontender, with distention due to colonoscopy. No apparent hepatosplenomegaly or masses.  EXTREMITIES: 2+ radial, femoral, dorsal and pedal pulses, with no lower extremity edema, cyanosis, clubbing, or ulcers.  NEUROLOGIC: He is oriented to time, place, and person, with normal mood and affect.   ASSESSMENT: This is a 69 year old male with moderate aortic stenosis, nonrheumatic; essential hypertension, coronary artery disease without angina, mixed hyperlipidemia, having atrial fibrillation with rapid ventricular rate, nonvalvular.   RECOMMENDATIONS:  1.  Metoprolol 25 mg for heart rate control and maintenance of normal rhythm.  2.  No anticoagulation at this time, due to potential spontaneous conversion and concerns of bleeding risks after a colonoscopy and intervention.  3.  Hypertension control with ACE inhibitor if necessary.  4.  No further intervention of coronary artery disease and aortic valve stenosis; stable at this time.  5.  Patient okay for discharge to home after colonoscopy.  6.  Follow up in 2 days for further adjustments of medications.  ____________________________ Lamar Blinks, MD bjk:MT D: 02/28/2014 10:55:00 ET T: 02/28/2014 11:18:49 ET JOB#: [id number]  cc: Lamar Blinks, MD, <Dictator> Lamar Blinks MD ELECTRONICALLY SIGNED 03/02/2014 8:36

## 2014-07-09 ENCOUNTER — Ambulatory Visit: Payer: Self-pay | Admitting: Family Medicine

## 2014-08-10 ENCOUNTER — Encounter: Payer: Self-pay | Admitting: General Practice

## 2014-08-10 ENCOUNTER — Emergency Department
Admission: EM | Admit: 2014-08-10 | Discharge: 2014-08-10 | Disposition: A | Discharge status: Home / Self Care | Payer: PPO | Attending: Emergency Medicine | Admitting: Emergency Medicine

## 2014-08-10 DIAGNOSIS — Y998 Other external cause status: Secondary | ICD-10-CM | POA: Insufficient documentation

## 2014-08-10 DIAGNOSIS — Z79899 Other long term (current) drug therapy: Secondary | ICD-10-CM | POA: Insufficient documentation

## 2014-08-10 DIAGNOSIS — Z7982 Long term (current) use of aspirin: Secondary | ICD-10-CM | POA: Diagnosis not present

## 2014-08-10 DIAGNOSIS — R739 Hyperglycemia, unspecified: Secondary | ICD-10-CM

## 2014-08-10 DIAGNOSIS — X58XXXA Exposure to other specified factors, initial encounter: Secondary | ICD-10-CM | POA: Insufficient documentation

## 2014-08-10 DIAGNOSIS — L03115 Cellulitis of right lower limb: Secondary | ICD-10-CM | POA: Diagnosis not present

## 2014-08-10 DIAGNOSIS — Z794 Long term (current) use of insulin: Secondary | ICD-10-CM | POA: Diagnosis not present

## 2014-08-10 DIAGNOSIS — S80811A Abrasion, right lower leg, initial encounter: Secondary | ICD-10-CM | POA: Insufficient documentation

## 2014-08-10 DIAGNOSIS — Y9289 Other specified places as the place of occurrence of the external cause: Secondary | ICD-10-CM | POA: Insufficient documentation

## 2014-08-10 DIAGNOSIS — Y9389 Activity, other specified: Secondary | ICD-10-CM | POA: Diagnosis not present

## 2014-08-10 DIAGNOSIS — E1165 Type 2 diabetes mellitus with hyperglycemia: Secondary | ICD-10-CM | POA: Diagnosis not present

## 2014-08-10 HISTORY — DX: Type 2 diabetes mellitus without complications: E11.9

## 2014-08-10 LAB — CBC WITH DIFFERENTIAL/PLATELET
Basophils Absolute: 0 10*3/uL (ref 0–0.1)
Basophils Relative: 0 %
Eosinophils Absolute: 0.2 10*3/uL (ref 0–0.7)
Eosinophils Relative: 2 %
HCT: 38.6 % — ABNORMAL LOW (ref 40.0–52.0)
Hemoglobin: 12.9 g/dL — ABNORMAL LOW (ref 13.0–18.0)
Lymphocytes Relative: 13 %
Lymphs Abs: 1.2 10*3/uL (ref 1.0–3.6)
MCH: 30.5 pg (ref 26.0–34.0)
MCHC: 33.3 g/dL (ref 32.0–36.0)
MCV: 91.5 fL (ref 80.0–100.0)
Monocytes Absolute: 1 10*3/uL (ref 0.2–1.0)
Monocytes Relative: 10 %
Neutro Abs: 7.3 10*3/uL — ABNORMAL HIGH (ref 1.4–6.5)
Neutrophils Relative %: 75 %
Platelets: 176 10*3/uL (ref 150–440)
RBC: 4.22 MIL/uL — ABNORMAL LOW (ref 4.40–5.90)
RDW: 14.1 % (ref 11.5–14.5)
WBC: 9.8 10*3/uL (ref 3.8–10.6)

## 2014-08-10 LAB — URINALYSIS COMPLETE WITH MICROSCOPIC (ARMC ONLY)
Bacteria, UA: NONE SEEN
Bilirubin Urine: NEGATIVE
Glucose, UA: >500 mg/dL — AB
Ketones, ur: NEGATIVE mg/dL
Nitrite: NEGATIVE
Protein, ur: NEGATIVE mg/dL
Specific Gravity, Urine: 1.012 (ref 1.005–1.030)
Squamous Epithelial / HPF: NONE SEEN
pH: 7 (ref 5.0–8.0)

## 2014-08-10 LAB — BASIC METABOLIC PANEL
Anion gap: 8 (ref 5–15)
BUN: 29 mg/dL — AB (ref 6–20)
CALCIUM: 9.1 mg/dL (ref 8.9–10.3)
CO2: 31 mmol/L (ref 22–32)
CREATININE: 1.28 mg/dL — AB (ref 0.61–1.24)
Chloride: 92 mmol/L — ABNORMAL LOW (ref 101–111)
GFR calc Af Amer: >60 mL/min (ref >60)
GFR, EST NON AFRICAN AMERICAN: 56 mL/min — AB (ref >60)
Glucose, Bld: 465 mg/dL — ABNORMAL HIGH (ref 65–99)
POTASSIUM: 4.1 mmol/L (ref 3.5–5.1)
SODIUM: 131 mmol/L — AB (ref 135–145)

## 2014-08-10 LAB — GLUCOSE, CAPILLARY
GLUCOSE-CAPILLARY: 487 mg/dL — AB (ref 65–99)
Glucose-Capillary: 365 mg/dL — ABNORMAL HIGH (ref 65–99)

## 2014-08-10 MED ORDER — INSULIN ASPART 100 UNIT/ML ~~LOC~~ SOLN
5.0000 [IU] | Freq: Once | SUBCUTANEOUS | Status: AC
  Administered 2014-08-10: 5 [IU] via INTRAVENOUS
  Filled 2014-08-10: qty 5

## 2014-08-10 MED ORDER — VANCOMYCIN HCL 10 G IV SOLR
2000.0000 mg | Freq: Once | INTRAVENOUS | Status: AC
  Administered 2014-08-10: 2000 mg via INTRAVENOUS
  Filled 2014-08-10: qty 2000

## 2014-08-10 MED ORDER — SULFAMETHOXAZOLE-TRIMETHOPRIM 800-160 MG PO TABS: 2.0000 | ORAL_TABLET | Freq: Two times a day (BID) | ORAL | Status: DC

## 2014-08-10 MED ORDER — SODIUM CHLORIDE 0.9 % IV BOLUS (SEPSIS)
1000.0000 mL | Freq: Once | INTRAVENOUS | Status: AC
  Administered 2014-08-10: 1000 mL via INTRAVENOUS

## 2014-08-10 MED ORDER — CEPHALEXIN 500 MG PO CAPS: 500.0000 mg | ORAL_CAPSULE | Freq: Four times a day (QID) | ORAL | Status: DC

## 2014-08-10 NOTE — ED Notes (Signed)
POC CBG results= 487 mg/dL. MD notified.

## 2014-08-10 NOTE — ED Notes (Signed)
Pt presents to ED with c/o redness and burning to RLE d/t an injury  He sustained last Monday. Per the pt, he slipped on a step going into his shed and scraped the skin on his right shin. A moderate area of redness is appreciated on the right shin, as well as an approximately 1" long by 5/8" wide area of eschar. Pt rates the pain a 8/10 in intensity.

## 2014-08-10 NOTE — ED Notes (Signed)
Pt to ED c/o redness to R lower leg with noted swelling, pt is a known diabetic.

## 2014-08-10 NOTE — ED Provider Notes (Signed)
[facility] Emergency Department Provider Note  ____________________________________________  Time seen: Approximately 1230  I have reviewed the triage vital signs and the nursing notes.   HISTORY  Chief Complaint Cellulitis    HPI Jay [name] is a 69 y.o. male with a history of diabetes and CAD who presents today with right lower extremity cellulitis. He says that he scraped his right shin on metal this past Monday and since then he has been having swelling and redness surrounding the wound. He denies any drainage of pus.  Says that his last tetanus shot was 2 years ago. Denies any fevers, nausea or vomiting.   Past Medical History  Diagnosis Date  . Diabetes mellitus without complication     Patient Active Problem List   Diagnosis Date Noted  . Acute kidney failure 05/23/2014  . Actinic keratosis 05/23/2014  . Absolute anemia 05/23/2014  . CAD in native artery 05/23/2014  . Depression, major, recurrent 05/23/2014  . DM (diabetes mellitus), secondary 05/23/2014  . Diabetic retinopathy 05/23/2014  . Abnormal LFTs 05/23/2014  . Fatty infiltration of liver 05/23/2014  . Acid reflux 05/23/2014  . BP (high blood pressure) 05/23/2014  . HLD (hyperlipidemia) 05/23/2014  . Anemia, iron deficiency 05/23/2014  . Metal bone fixation hardware in place 05/23/2014  . Adiposity 05/23/2014  . Obstructive apnea 05/23/2014  . Plaque psoriasis 05/23/2014  . Radial nerve disease 05/23/2014    Past Surgical History  Procedure Laterality Date  . Coronary angioplasty with stent placement  2006 and 2008    Current Outpatient Rx  Name  Route  Sig  Dispense  Refill  . aspirin 81 MG tablet   Oral   Take 1 tablet by mouth daily.         . ferrous sulfate 325 (65 FE) MG tablet   Oral   Take 1 tablet by mouth 2 (two) times daily.         Marland Kitchen glimepiride (AMARYL) 4 MG tablet   Oral   Take 2 tablets by mouth 2 (two) times daily.         . Insulin  Detemir (LEVEMIR) 100 UNIT/ML Pen   Subcutaneous   Inject 80 Units into the skin at bedtime.         . isosorbide mononitrate (IMDUR) 30 MG 24 hr tablet   Oral   Take 1 tablet by mouth daily.         Marland Kitchen linagliptin (TRADJENTA) 5 MG TABS tablet   Oral   Take 1 tablet by mouth daily.         . meclizine (ANTIVERT) 25 MG tablet   Oral   Take 1 tablet by mouth 3 (three) times daily.         . metoprolol tartrate (LOPRESSOR) 25 MG tablet   Oral   Take 0.5 tablets by mouth daily.         . mometasone (ELOCON) 0.1 % cream   Topical   Apply 1 application topically daily. For 2 weeks         . nitroGLYCERIN (NITROSTAT) 0.4 MG SL tablet   Sublingual   Place 1 tablet under the tongue as needed. At onset of pain, may repeat in 5 minutes x 3         . pantoprazole (PROTONIX) 40 MG tablet   Oral   Take 1 tablet by mouth 2 (two) times daily.         . rosuvastatin (CRESTOR) 10 MG tablet  Oral   Take 1 tablet by mouth daily.         . sertraline (ZOLOFT) 50 MG tablet   Oral   Take 1 tablet by mouth daily.         Marland Kitchen torsemide (DEMADEX) 20 MG tablet   Oral   Take 1 tablet by mouth 2 (two) times daily.           Allergies Review of patient's allergies indicates no known allergies.  Family History  Problem Relation Age of Onset  . Leukemia Mother   . Cancer Father   . Deep vein thrombosis Father   . Psychiatric Illness Brother   . Heart disease Brother     Social History History  Substance Use Topics  . Smoking status: Not on file  . Smokeless tobacco: Not on file  . Alcohol Use: Not on file    Review of Systems Constitutional: No fever/chills Eyes: No visual changes. ENT: No sore throat. Cardiovascular: Denies chest pain. Respiratory: Denies shortness of breath. Gastrointestinal: No abdominal pain.  No nausea, no vomiting.  No diarrhea.  No constipation. Genitourinary: Negative for dysuria. Musculoskeletal: Negative for back pain. Skin: As  above  Neurological: Negative for headaches, focal weakness or numbness.  10-point ROS otherwise negative.  ____________________________________________   PHYSICAL EXAM:  VITAL SIGNS: ED Triage Vitals  Enc Vitals Group     BP 08/10/14 0052 165/74 mmHg     Pulse Rate 08/10/14 0052 77     Resp 08/10/14 0052 20     Temp 08/10/14 0052 98.3 F (36.8 C)     Temp Source 08/10/14 0052 Oral     SpO2 08/10/14 0052 99 %     Weight 08/10/14 0052 270 lb (122.471 kg)     Height 08/10/14 0052  (1.753 m)     Head Cir --      Peak Flow --      Pain Score 08/10/14 0053 10     Pain Loc --      Pain Edu? --      Excl. in GC? --     Constitutional: Alert and oriented. Well appearing and in no acute distress. Eyes: Conjunctivae are normal. PERRL. EOMI. Head: Atraumatic. Nose: No congestion/rhinnorhea. Mouth/Throat: Mucous membranes are moist.  Oropharynx non-erythematous. Neck: No stridor.   Cardiovascular: Normal rate, regular rhythm. Grossly normal heart sounds.  Good peripheral circulation. Respiratory: Normal respiratory effort.  No retractions. Lungs CTAB. Gastrointestinal: Soft and nontender. No distention. No abdominal bruits. No CVA tenderness. Musculoskeletal: No lower extremity tenderness nor edema.  No joint effusions. Neurologic:  Normal speech and language. No gross focal neurologic deficits are appreciated. Speech is normal. No gait instability. Skin:  Right distal and anterior shin with a 1 x 3 cm abrasion with overlying scab. There is no fluctuance. There is a 5 cm wheal of erythema surrounding which is raised. Mild induration. No pus expressed. Tenderness to palpation and warm.  Psychiatric: Mood and affect are normal. Speech and behavior are normal.  ____________________________________________   LABS (all labs ordered are listed, but only abnormal results are displayed)  Labs Reviewed  GLUCOSE, CAPILLARY - Abnormal; Notable for the following:    Glucose-Capillary  487 (*)    All other components within normal limits  CBC WITH DIFFERENTIAL/PLATELET - Abnormal; Notable for the following:    RBC 4.22 (*)    Hemoglobin 12.9 (*)    HCT 38.6 (*)    Neutro Abs 7.3 (*)  All other components within normal limits  BASIC METABOLIC PANEL - Abnormal; Notable for the following:    Sodium 131 (*)    Chloride 92 (*)    Glucose, Bld 465 (*)    BUN 29 (*)    Creatinine, Ser 1.28 (*)    GFR calc non Af Amer 56 (*)    All other components within normal limits  URINALYSIS COMPLETEWITH MICROSCOPIC ([facility] ONLY) - Abnormal; Notable for the following:    Color, Urine STRAW (*)    APPearance CLEAR (*)    Glucose, UA >500 (*)    Hgb urine dipstick 1+ (*)    Leukocytes, UA TRACE (*)    All other components within normal limits  GLUCOSE, CAPILLARY - Abnormal; Notable for the following:    Glucose-Capillary 365 (*)    All other components within normal limits  CBG MONITORING, ED   ____________________________________________  EKG   ____________________________________________  RADIOLOGY   ____________________________________________   PROCEDURES    ____________________________________________   INITIAL IMPRESSION / ASSESSMENT AND PLAN / ED COURSE  Pertinent labs & imaging results that were available during my care of the patient were reviewed by me and considered in my medical decision making (see chart for details).  ----------------------------------------- 4:38 AM on 08/10/2014 -----------------------------------------  Patient resting comfortably in glucose now down to 365. We'll discharge to home on by mouth antibiotics. Patient says that hasn't been sleeping well over the past several days and that is why he thinks his sugar is high.  ___ I counseled him to drink plenty of water and also reduces sugary food intake to help with wound healing. Patient understands the plan and is willing to  comply._________________________________________   FINAL CLINICAL IMPRESSION(S) / ED DIAGNOSES  Acute cellulitis. Acute hyperglycemia. Initial visit.     Myrna Blazer, MD 08/10/14 [phone]

## 2014-08-10 NOTE — Discharge Instructions (Signed)

## 2014-08-17 ENCOUNTER — Ambulatory Visit (INDEPENDENT_AMBULATORY_CARE_PROVIDER_SITE_OTHER): Payer: PPO | Admitting: Family Medicine

## 2014-08-17 ENCOUNTER — Encounter: Payer: Self-pay | Admitting: Family Medicine

## 2014-08-17 VITALS — BP 112/60 | HR 75 | Temp 97.6°F | Resp 16 | Wt 274.0 lb

## 2014-08-17 DIAGNOSIS — T148 Other injury of unspecified body region: Secondary | ICD-10-CM

## 2014-08-17 DIAGNOSIS — T798XXA Other early complications of trauma, initial encounter: Secondary | ICD-10-CM | POA: Diagnosis not present

## 2014-08-17 DIAGNOSIS — T148XXA Other injury of unspecified body region, initial encounter: Secondary | ICD-10-CM

## 2014-08-17 DIAGNOSIS — L089 Local infection of the skin and subcutaneous tissue, unspecified: Secondary | ICD-10-CM | POA: Insufficient documentation

## 2014-08-17 MED ORDER — CEFTRIAXONE SODIUM 1 G IJ SOLR
1.0000 g | Freq: Once | INTRAMUSCULAR | Status: AC
  Administered 2014-08-17: 1 g via INTRAMUSCULAR

## 2014-08-17 MED ORDER — SODIUM CHLORIDE 0.9 % EX SOLN: 2.0000 "application " | Freq: Two times a day (BID) | CUTANEOUS | Status: DC

## 2014-08-17 NOTE — Patient Instructions (Addendum)
Clean wound twice daily with saline and apply dressing (keep wound moist) and bandage.

## 2014-08-17 NOTE — Progress Notes (Signed)
Subjective:     Patient ID: Jay [name], male   DOB: August 08, 1945, 69 y.o.   MRN: [id number]  HPI  Chief Complaint  Patient presents with  . [name]    Patient presents in office today with concerns of leg infection. He states about a week ago he had slipped and fell while trying to walk into his shed. Patient reports that he was see yesterday at [facility] ER and was treated and prescribed antibiotic, patient reports leg is still very painful and swollen.  He was initially seen in the [facility] ER 7/8 and placed on dual abx therapy for cellulitis. Patient states wound has gotten larger and more painful.   Review of Systems  Constitutional: Negative for fever and chills.       Objective:   Physical Exam  Constitutional: He appears well-developed and well-nourished. No distress.  Skin:  Distal lower extremity wound with swelling, large inflammatory flare and a central dark eschar. Procedure note; Cleansed with Betadine and eschar partially debrided before a moderate amount of dark blood with clots expressed. Swelling resolved with release of hematoma. Cleansed with saline and dressing/bandage applied.       Assessment:    1. Wound infection, initial encounter - cefTRIAXone (ROCEPHIN) injection 1 g; Inject 1 g into the muscle once. - SODIUM CHLORIDE, EXTERNAL, ([facility] SALINE WOUND WASH) 0.9 % SOLN; Apply 2 application topically 2 (two) times daily.  Dispense: 210 mL; Refill: 0 - Debridement  2. Hematoma - Debridement    Plan:    Return on 7/18 and complete current course of abx. Twice daily cleansing with saline and application of dressing.

## 2014-08-20 ENCOUNTER — Encounter: Payer: Self-pay | Admitting: Family Medicine

## 2014-08-20 ENCOUNTER — Ambulatory Visit (INDEPENDENT_AMBULATORY_CARE_PROVIDER_SITE_OTHER): Payer: PPO | Admitting: Family Medicine

## 2014-08-20 VITALS — BP 114/60 | HR 89 | Temp 97.6°F | Resp 16 | Wt 270.8 lb

## 2014-08-20 DIAGNOSIS — N189 Chronic kidney disease, unspecified: Secondary | ICD-10-CM

## 2014-08-20 DIAGNOSIS — E1122 Type 2 diabetes mellitus with diabetic chronic kidney disease: Secondary | ICD-10-CM

## 2014-08-20 DIAGNOSIS — T798XXD Other early complications of trauma, subsequent encounter: Secondary | ICD-10-CM

## 2014-08-20 LAB — GLUCOSE, POCT (MANUAL RESULT ENTRY): POC GLUCOSE: 410 mg/dL — AB (ref 70–99)

## 2014-08-20 MED ORDER — SULFAMETHOXAZOLE-TRIMETHOPRIM 800-160 MG PO TABS: 2.0000 | ORAL_TABLET | Freq: Two times a day (BID) | ORAL | Status: DC

## 2014-08-20 MED ORDER — CEPHALEXIN 500 MG PO CAPS: 500.0000 mg | ORAL_CAPSULE | Freq: Four times a day (QID) | ORAL | Status: AC

## 2014-08-20 NOTE — Progress Notes (Signed)
Subjective:     Patient ID: Jay [name], male   DOB: 1945/09/21, 69 y.o.   MRN: [id number]  HPI  Chief Complaint  Patient presents with  . Wound Check    Patient is here for follow up from 08/17/14, patient had [name] a week ago while going into his shed. Patient suffered cutt to right side of his leg, rocephin 1gm was administered and antibiotics prescribed.   States it is less red and swollen. He has nearly completed course of his dual antibiotic therapy and has been cleansing his wound with saline as directed.Concerned that his sugar may be elevated today. He has not been on tradjenta due to $ concerns pending receipt of his check this week.   Review of Systems  Constitutional: Negative for fever and chills.       Objective:   Physical Exam  Constitutional: He appears well-developed and well-nourished. No distress.  Skin:  Less swollen than on 7/15 with fading erythema though still present. Remains painful with serosanguinous drainage. Partial eschar still present.  Co-examined with Dr. Sullivan Lone.    Assessment:    1. Wound infection, subsequent encounter - cephALEXin (KEFLEX) 500 MG capsule; Take 1 capsule (500 mg total) by mouth 4 (four) times daily.  Dispense: 40 capsule; Refill: 0 - sulfamethoxazole-trimethoprim (BACTRIM DS,SEPTRA DS) 800-160 MG per tablet; Take 2 tablets by mouth 2 (two) times daily.  Dispense: 40 tablet; Refill: 0  2. Type 2 diabetes mellitus with diabetic chronic kidney disease - POCT glucose (manual entry)    Plan:    Continue wound care with saline cleansing and dressing, elevation of his extremity, and continued course of dual abx therapy. Will increased Levemir to 90 units daily. May need split dosing soon. F/u with primary M.D. In 1-2 weeks.

## 2014-08-20 NOTE — Patient Instructions (Addendum)
Increase Levemir to 90 units daily. Continue leg elevation.

## 2014-09-04 ENCOUNTER — Ambulatory Visit (INDEPENDENT_AMBULATORY_CARE_PROVIDER_SITE_OTHER): Payer: PPO | Admitting: Family Medicine

## 2014-09-04 ENCOUNTER — Encounter: Payer: Self-pay | Admitting: Family Medicine

## 2014-09-04 VITALS — BP 118/62 | HR 72 | Temp 97.7°F | Resp 16 | Wt 273.0 lb

## 2014-09-04 DIAGNOSIS — I10 Essential (primary) hypertension: Secondary | ICD-10-CM

## 2014-09-04 DIAGNOSIS — E1122 Type 2 diabetes mellitus with diabetic chronic kidney disease: Secondary | ICD-10-CM

## 2014-09-04 DIAGNOSIS — T798XXD Other early complications of trauma, subsequent encounter: Secondary | ICD-10-CM

## 2014-09-04 DIAGNOSIS — N481 Balanitis: Secondary | ICD-10-CM | POA: Diagnosis not present

## 2014-09-04 DIAGNOSIS — I2510 Atherosclerotic heart disease of native coronary artery without angina pectoris: Secondary | ICD-10-CM

## 2014-09-04 DIAGNOSIS — N189 Chronic kidney disease, unspecified: Secondary | ICD-10-CM

## 2014-09-04 LAB — POCT UA - MICROALBUMIN: MICROALBUMIN (UR) POC: 20 mg/L

## 2014-09-04 LAB — POCT GLYCOSYLATED HEMOGLOBIN (HGB A1C): Hemoglobin A1C: 12.3

## 2014-09-04 MED ORDER — GLIPIZIDE 5 MG PO TABS: 5.0000 mg | ORAL_TABLET | Freq: Two times a day (BID) | ORAL | Status: DC

## 2014-09-04 MED ORDER — SULFAMETHOXAZOLE-TRIMETHOPRIM 800-160 MG PO TABS: 2.0000 | ORAL_TABLET | Freq: Two times a day (BID) | ORAL | Status: DC

## 2014-09-04 MED ORDER — NYSTATIN 100000 UNIT/GM EX CREA: 1.0000 "application " | TOPICAL_CREAM | Freq: Two times a day (BID) | CUTANEOUS | Status: DC

## 2014-09-04 NOTE — Progress Notes (Signed)
Patient ID: Jay [name], male   DOB: 1945/12/15, 69 y.o.   MRN: [id number]    Subjective:  HPI  Wound infection follow up:  Patient saw Nadine Counts on July 18th and was given Keflex and Septra DS-he finished these medications.  Patient states the wound looks better, it is still sore and red. No fever or chills.  Diabetes follow up:  When patient saw Nadine Counts 2 weeks ago his Glucose at that time was 410 so patient was instructed to increase Levemir to 90 units.  He has been taking 50 units twice daily.  He has been out of Glimiperide and tradjenta for about 1 week. His sugars at home running over 300 to 400.  He states he just can not afford all of these medications. He is having polydipsia. He does check his feet regularly and has not seen any sores.  Lab Results  Component Value Date   HGBA1C 11.9* 03/29/2014      Prior to Admission medications   Medication Sig Start Date End Date Taking? Authorizing Provider  aspirin 81 MG tablet Take 1 tablet by mouth daily. 12/01/10  Yes Historical Provider, MD  ferrous sulfate 325 (65 FE) MG tablet Take 1 tablet by mouth 2 (two) times daily.   Yes Historical Provider, MD  Insulin Detemir (LEVEMIR) 100 UNIT/ML Pen Inject 90 Units into the skin at bedtime. 05/21/14  Yes Historical Provider, MD  isosorbide mononitrate (IMDUR) 30 MG 24 hr tablet Take 1 tablet by mouth daily. 02/20/14  Yes Historical Provider, MD  metoprolol tartrate (LOPRESSOR) 25 MG tablet Take 0.5 tablets by mouth daily.   Yes Historical Provider, MD  mometasone (ELOCON) 0.1 % cream Apply 1 application topically daily. For 2 weeks 03/26/14  Yes Historical Provider, MD  rosuvastatin (CRESTOR) 10 MG tablet Take 1 tablet by mouth daily. 02/21/14  Yes Historical Provider, MD  SODIUM CHLORIDE, EXTERNAL, ([facility] SALINE WOUND WASH) 0.9 % SOLN Apply 2 application topically 2 (two) times daily. 08/17/14  Yes Anola Gurney, PA  torsemide (DEMADEX) 20 MG tablet Take 1 tablet by mouth 2 (two) times  daily. 03/01/14  Yes Historical Provider, MD  glimepiride (AMARYL) 4 MG tablet Take 2 tablets by mouth 2 (two) times daily. 05/07/14   Historical Provider, MD  linagliptin (TRADJENTA) 5 MG TABS tablet Take 1 tablet by mouth daily. 05/21/14   Historical Provider, MD  meclizine (ANTIVERT) 25 MG tablet Take 1 tablet by mouth 3 (three) times daily. 10/05/13   Historical Provider, MD    Patient Active Problem List   Diagnosis Date Noted  . Diabetes mellitus 08/20/2014  . Wound infection 08/17/2014  . Acute kidney failure 05/23/2014  . Actinic keratosis 05/23/2014  . Absolute anemia 05/23/2014  . CAD in native artery 05/23/2014  . Depression, major, recurrent 05/23/2014  . DM (diabetes mellitus), secondary 05/23/2014  . Diabetic retinopathy 05/23/2014  . Abnormal LFTs 05/23/2014  . Fatty infiltration of liver 05/23/2014  . Acid reflux 05/23/2014  . BP (high blood pressure) 05/23/2014  . HLD (hyperlipidemia) 05/23/2014  . Anemia, iron deficiency 05/23/2014  . Metal bone fixation hardware in place 05/23/2014  . Adiposity 05/23/2014  . Obstructive apnea 05/23/2014  . Plaque psoriasis 05/23/2014  . 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 AF (paroxysmal atrial fibrillation) 03/01/2014  . Aortic heart valve narrowing 01/05/2014  . Chronic kidney disease (CKD), stage IV (severe) 01/05/2014  . Benign essential HTN 01/05/2014    Past Medical History  Diagnosis Date  . Diabetes mellitus without complication  History   Social History  . Marital Status: Unknown    Spouse Name: N/A  . Number of Children: N/A  . Years of Education: N/A   Occupational History  . Not on file.   Social History Main Topics  . Smoking status: Never Smoker   . Smokeless tobacco: Never Used  . Alcohol Use: No  . Drug Use: No  . Sexual Activity: No   Other Topics Concern  . Not on file   Social History Narrative    Allergies  Allergen Reactions  . Metformin Nausea Only    Review of Systems    Constitutional: Positive for malaise/fatigue.  HENT: Negative.   Eyes: Negative.   Respiratory: Negative.   Cardiovascular: Negative.   Gastrointestinal: Negative.   Genitourinary:       Significant irritation/rash of penis and glans penis.  Skin: Positive for rash.  Neurological: Negative.   Psychiatric/Behavioral: Negative.     Immunization History  Administered Date(s) Administered  . Td 11/26/2000   Objective:  BP 118/62 mmHg  Pulse 72  Temp(Src) 97.7 F (36.5 C)  Resp 16  Wt 273 lb (123.832 kg)  Physical Exam  Constitutional: He is oriented to person, place, and time and well-developed, well-nourished, and in no distress.  HENT:  Head: Normocephalic.  Eyes: Conjunctivae are normal. Pupils are equal, round, and reactive to light.  Cardiovascular: Normal rate, regular rhythm, normal heart sounds and intact distal pulses.   No murmur heard. Pulmonary/Chest: Effort normal and breath sounds normal. No respiratory distress. He has no wheezes. He has no rales. He exhibits no tenderness.  Genitourinary:  A lot of erythema and mild induration of glands penis and foreskin that is there.  Musculoskeletal: He exhibits tenderness.  4 inches of erythema and induration right lower extremity.  Neurological: He is alert and oriented to person, place, and time.  Skin: There is erythema.  Psychiatric: Mood, memory, affect and judgment normal.    Lab Results  Component Value Date   WBC 9.8 08/10/2014   HGB 12.9* 08/10/2014   HCT 38.6* 08/10/2014   PLT 176 08/10/2014   GLUCOSE 465* 08/10/2014   CHOL 105 11/07/2013   TRIG 126 11/07/2013   HDL 41 11/07/2013   LDLCALC 39 11/07/2013   TSH 1.23 10/05/2013   PSA 0.3 03/31/2012   HGBA1C 11.9* 03/29/2014    CMP     Component Value Date/Time   NA 131* 08/10/2014 0136   NA 136* 05/21/2014   NA 136 01/02/2014 0439   K 4.1 08/10/2014 0136   K 4.2 01/02/2014 0439   CL 92* 08/10/2014 0136   CL 101 01/02/2014 0439   CO2 31  08/10/2014 0136   CO2 27 01/02/2014 0439   GLUCOSE 465* 08/10/2014 0136   GLUCOSE 152* 01/02/2014 0439   BUN 29* 08/10/2014 0136   BUN 28* 05/21/2014   BUN 62* 01/02/2014 0439   CREATININE 1.28* 08/10/2014 0136   CREATININE 1.2 05/21/2014   CREATININE 1.96* 01/02/2014 0439   CALCIUM 9.1 08/10/2014 0136   CALCIUM 7.9* 01/02/2014 0439   PROT 6.6 12/30/2013 1800   ALBUMIN 2.7* 12/30/2013 1800   AST 28 12/30/2013 1800   ALT 29 12/30/2013 1800   ALKPHOS 102 12/30/2013 1800   BILITOT 0.5 12/30/2013 1800   GFRNONAA 56* 08/10/2014 0136   GFRNONAA 59* 06/07/2012 0527   GFRAA >60 08/10/2014 0136   GFRAA >60 06/07/2012 0527    Assessment and Plan :  1. Wound infection, subsequent encounter Better. Restart  Septra DS again. Follow. Nadine Counts saw the wound today so this way if patient starts to have trouble with this again he can call Nadine Counts and be seen. - sulfamethoxazole-trimethoprim (BACTRIM DS,SEPTRA DS) 800-160 MG per tablet; Take 2 tablets by mouth 2 (two) times daily.  Dispense: 40 tablet; Refill: 0  2. Type 2 diabetes mellitus with diabetic chronic kidney disease A1C 12.3-due to cost will switch Glemipiride to Glipizide and see if that helps. Samples of LEvemir and Tradjenta provided. ADvised patient to write down sugar readings he is getting and bring it on the next visit.  - POCT HgB A1C - POCT UA - Microalbumin  3. Balanitis New. Treat with Nystatin cream. Follow as needed.May need urology referral. In future.  4. Benign essential HTN   5. CAD in native artery       Patient was seen and examined by Dr. Bosie Clos and note was scribed by Samara Deist, RMA.    Julieanne Manson MD [facility] 09/04/2014 1:54 PM

## 2014-09-26 ENCOUNTER — Ambulatory Visit: Payer: PPO | Admitting: Family Medicine

## 2014-11-01 ENCOUNTER — Other Ambulatory Visit: Payer: Self-pay | Admitting: Family Medicine

## 2014-12-20 ENCOUNTER — Other Ambulatory Visit: Payer: Self-pay | Admitting: Family Medicine

## 2015-01-11 ENCOUNTER — Other Ambulatory Visit: Payer: Self-pay | Admitting: Family Medicine

## 2015-01-11 DIAGNOSIS — K219 Gastro-esophageal reflux disease without esophagitis: Secondary | ICD-10-CM

## 2015-01-14 ENCOUNTER — Telehealth: Payer: Self-pay | Admitting: Family Medicine

## 2015-01-14 NOTE — Telephone Encounter (Signed)
Pt wife Festus Holts sent a request for a generic or something different for CRESTOR 10 MG tablet.  The insurance company will not cover the CRESTOR 10 MG tablet.  Pt has has out of medication since Thursday.  WS#[id number]

## 2015-01-15 MED ORDER — ATORVASTATIN CALCIUM 80 MG PO TABS: 80.0000 mg | ORAL_TABLET | Freq: Every day | ORAL | Status: DC

## 2015-01-15 NOTE — Telephone Encounter (Signed)
-   Atorvastatin 80 mg daily 

## 2015-01-15 NOTE — Telephone Encounter (Signed)
RX sent in and wife Noralee Chars

## 2015-01-15 NOTE — Telephone Encounter (Signed)
Please review-aa 

## 2015-02-14 ENCOUNTER — Other Ambulatory Visit: Payer: Self-pay | Admitting: Family Medicine

## 2015-02-14 ENCOUNTER — Telehealth: Payer: Self-pay

## 2015-02-14 MED ORDER — ROSUVASTATIN CALCIUM 10 MG PO TABS: 10.0000 mg | ORAL_TABLET | Freq: Every day | ORAL | Status: DC

## 2015-02-14 NOTE — Telephone Encounter (Signed)
Spoke with pharmacist and updated the right medications in the chart-aa

## 2015-03-18 ENCOUNTER — Other Ambulatory Visit: Payer: Self-pay | Admitting: Family Medicine

## 2015-04-16 ENCOUNTER — Other Ambulatory Visit: Payer: Self-pay | Admitting: Family Medicine

## 2015-04-17 ENCOUNTER — Other Ambulatory Visit: Payer: Self-pay | Admitting: Emergency Medicine

## 2015-04-17 MED ORDER — METOPROLOL TARTRATE 25 MG PO TABS: 12.5000 mg | ORAL_TABLET | Freq: Two times a day (BID) | ORAL | Status: DC

## 2015-05-14 ENCOUNTER — Other Ambulatory Visit: Payer: Self-pay | Admitting: Family Medicine

## 2015-05-21 ENCOUNTER — Telehealth: Payer: Self-pay

## 2015-05-21 NOTE — Telephone Encounter (Signed)
Patient called office this afternoon with concerns of left sided chest pain that has been intermittent for the past week. Patient describes pain as a dull ache and states that he has been having persistent shortness of breath, tingling and numbness in left arm radiating to his left hand and dizziness. Patient states that he believes  that he is having these symptoms due to his Crestor medication being switched. When I looked back in patients chart,patients medication was changed in 01/2015. I advised patient that it would be best that he seek immediate medical attention due to the risk of a cardiovascular event. Patient declined seeking medical treatment stating that his wife is at home and there is no one there to watch her. Patient stated that he arranged an appointment with Dr. Sullivan Lone for Thursday afternoon at 3:30PM. I spoke with PA Maurine Minister Chrismon who also agreed that patient seek immediate medical attention sooner than later, but patient still declined. Patient stated on phone " I will try not to move around to much so my chest dosent hurt and I wont get short of breath. Patient states that there is no one to watch wife today and declined an appt this afternoon and tomorrow morning stating that tomorrow is pay day. Please advise. KW

## 2015-05-21 NOTE — Telephone Encounter (Signed)
Patient was called back and he continues to refuse any medical attention today.  He said he will not be able to do anything before Thursday. ED

## 2015-05-23 ENCOUNTER — Encounter: Payer: Self-pay | Admitting: Family Medicine

## 2015-05-23 ENCOUNTER — Ambulatory Visit (INDEPENDENT_AMBULATORY_CARE_PROVIDER_SITE_OTHER): Payer: PPO | Admitting: Family Medicine

## 2015-05-23 VITALS — BP 136/60 | HR 82 | Temp 98.0°F | Resp 18 | Wt 300.0 lb

## 2015-05-23 DIAGNOSIS — N400 Enlarged prostate without lower urinary tract symptoms: Secondary | ICD-10-CM | POA: Diagnosis not present

## 2015-05-23 DIAGNOSIS — R079 Chest pain, unspecified: Secondary | ICD-10-CM

## 2015-05-23 DIAGNOSIS — D649 Anemia, unspecified: Secondary | ICD-10-CM | POA: Diagnosis not present

## 2015-05-23 DIAGNOSIS — I10 Essential (primary) hypertension: Secondary | ICD-10-CM

## 2015-05-23 DIAGNOSIS — E669 Obesity, unspecified: Secondary | ICD-10-CM

## 2015-05-23 DIAGNOSIS — R0602 Shortness of breath: Secondary | ICD-10-CM

## 2015-05-23 DIAGNOSIS — E139 Other specified diabetes mellitus without complications: Secondary | ICD-10-CM | POA: Diagnosis not present

## 2015-05-23 DIAGNOSIS — R609 Edema, unspecified: Secondary | ICD-10-CM | POA: Diagnosis not present

## 2015-05-23 MED ORDER — ISOSORBIDE MONONITRATE ER 60 MG PO TB24: 30.0000 mg | ORAL_TABLET | Freq: Every day | ORAL | Status: DC

## 2015-05-23 MED ORDER — TAMSULOSIN HCL 0.4 MG PO CAPS: 0.4000 mg | ORAL_CAPSULE | Freq: Every day | ORAL | Status: DC

## 2015-05-23 MED ORDER — TORSEMIDE 20 MG PO TABS: 60.0000 mg | ORAL_TABLET | Freq: Every day | ORAL | Status: DC

## 2015-05-23 NOTE — Patient Instructions (Signed)
Go to lab, then across the street to  out patient imaging to get chest xray, then to pharmacy to medication changes

## 2015-05-23 NOTE — Progress Notes (Signed)
Patient ID: Jay [name], male   DOB: [birth year], 70 y.o.   MRN: [id number]    Subjective:  HPI Pt is here today for chest pain. He reports that it has been going on for about a week now. He called 2 days ago to the office were he was advise to go to the ER and refused. Pt reports that he is short of breath with any activity and has chest pain with exertion. He has also had noticable swelling in his feet and through out the entire body, but mainly his feet and legs.   Prior to Admission medications   Medication Sig Start Date End Date Taking? Authorizing Provider  aspirin 81 MG tablet Take 1 tablet by mouth daily. 12/01/10  Yes Historical Provider, MD  ferrous sulfate 325 (65 FE) MG tablet Take 1 tablet by mouth 2 (two) times daily.   Yes Historical Provider, MD  glimepiride (AMARYL) 4 MG tablet TAKE 2 TABLETS BY MOUTH TWICE DAILY. 02/14/15  Yes Joah Hulen Shouts., MD  glipiZIDE (GLUCOTROL) 5 MG tablet Take 1 tablet (5 mg total) by mouth 2 (two) times daily before a meal. 09/04/14  Yes Maple Hudson., MD  isosorbide mononitrate (IMDUR) 30 MG 24 hr tablet TAKE 1 TABLET BY MOUTH ONCE DAILY. 04/17/15  Yes Devlyn Hulen Shouts., MD  LEVEMIR FLEXTOUCH 100 UNIT/ML Pen INJECT 85 UNITS SUBCUTANEOUSLY EVERY NIGHT. 05/14/15  Yes Lorie Phenix, MD  meclizine (ANTIVERT) 25 MG tablet TAKE 1 TABLET BY MOUTH 3 TIMES DAILY AS NEEDED. 11/02/14  Yes Maple Hudson., MD  metoprolol tartrate (LOPRESSOR) 25 MG tablet Take 0.5 tablets (12.5 mg total) by mouth 2 (two) times daily. 04/17/15  Yes Braelynn Hulen Shouts., MD  mometasone (ELOCON) 0.1 % cream Apply 1 application topically daily. For 2 weeks 03/26/14  Yes Historical Provider, MD  pantoprazole (PROTONIX) 40 MG tablet TAKE 1 TABLET BY MOUTH TWICE DAILY. 01/12/15  Yes Kreed Hulen Shouts., MD  sertraline (ZOLOFT) 50 MG tablet TAKE 1 TABLET BY MOUTH ONCE DAILY. 02/14/15  Yes Lyan Hulen Shouts., MD  SODIUM CHLORIDE, EXTERNAL, ([facility] SALINE WOUND WASH)  0.9 % SOLN Apply 2 application topically 2 (two) times daily. 08/17/14  Yes Anola Gurney, PA  torsemide (DEMADEX) 20 MG tablet TAKE 1 TABLET BY MOUTH TWICE DAILY 03/18/15  Yes Caulin Hulen Shouts., MD  TRADJENTA 5 MG TABS tablet TAKE 1 TABLET BY MOUTH ONCE DAILY. 03/18/15  Yes Dung Hulen Shouts., MD  nystatin cream (MYCOSTATIN) Apply 1 application topically 2 (two) times daily. Patient not taking: Reported on 05/23/2015 09/04/14   Maple Hudson., MD  rosuvastatin (CRESTOR) 10 MG tablet Take 1 tablet (10 mg total) by mouth daily. Patient not taking: Reported on 05/23/2015 02/14/15   Maple Hudson., MD    Patient Active Problem List   Diagnosis Date Noted  . Diabetes mellitus (HCC) 08/20/2014  . Wound infection (HCC) 08/17/2014  . Acute kidney failure (HCC) 05/23/2014  . Actinic keratosis 05/23/2014  . Absolute anemia 05/23/2014  . CAD in native artery 05/23/2014  . Depression, major, recurrent (HCC) 05/23/2014  . DM (diabetes mellitus), secondary (HCC) 05/23/2014  . Diabetic retinopathy (HCC) 05/23/2014  . Abnormal LFTs 05/23/2014  . Fatty infiltration of liver 05/23/2014  . Acid reflux 05/23/2014  . BP (high blood pressure) 05/23/2014  . HLD (hyperlipidemia) 05/23/2014  . Anemia, iron deficiency 05/23/2014  . Metal bone fixation hardware in place 05/23/2014  . Adiposity  05/23/2014  . Obstructive apnea 05/23/2014  . Plaque psoriasis 05/23/2014  . 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 AF (paroxysmal atrial fibrillation) (HCC) 03/01/2014  . Aortic heart valve narrowing 01/05/2014  . Chronic kidney disease (CKD), stage IV (severe) (HCC) 01/05/2014  . Benign essential HTN 01/05/2014    Past Medical History  Diagnosis Date  . Diabetes mellitus without complication [facility])     Social History   Social History  . Marital Status: Unknown    Spouse Name: N/A  . Number of Children: N/A  . Years of Education: N/A   Occupational History  . Not on file.   Social History  Main Topics  . Smoking status: Never Smoker   . Smokeless tobacco: Never Used  . Alcohol Use: No  . Drug Use: No  . Sexual Activity: No   Other Topics Concern  . Not on file   Social History Narrative    Allergies  Allergen Reactions  . Metformin Nausea Only    Review of Systems  Constitutional: Positive for malaise/fatigue.  HENT: Positive for congestion.        Ear fullness  Eyes: Negative.   Respiratory: Positive for cough.   Cardiovascular: Positive for chest pain, orthopnea and leg swelling.  Gastrointestinal: Negative.   Genitourinary: Negative.        Decreased urine stream  Musculoskeletal: Negative.   Skin: Negative.   Neurological: Negative.   Endo/Heme/Allergies: Negative.   Psychiatric/Behavioral: Negative.     Immunization History  Administered Date(s) Administered  . Td 11/26/2000   Objective:  BP 136/60 mmHg  Pulse 82  Temp(Src) 98 F (36.7 C) (Oral)  Resp 18  Wt 300 lb (136.079 kg)  SpO2 93%  Physical Exam  Constitutional: He is oriented to person, place, and time and well-developed, well-nourished, and in no distress.  HENT:  Head: Normocephalic and atraumatic.  Right Ear: External ear normal.  Left Ear: External ear normal.  Nose: Nose normal.  Mouth/Throat: Oropharynx is clear and moist.  Eyes: Conjunctivae and EOM are normal. Pupils are equal, round, and reactive to light.  Neck: Normal range of motion. Neck supple.  Cardiovascular: Normal rate, regular rhythm, normal heart sounds and intact distal pulses.   2 to 3/6 systolic murmur  Pulmonary/Chest: Effort normal and breath sounds normal.  Abdominal: Soft.  Musculoskeletal: Normal range of motion. He exhibits edema.  2+ lower extremity edema  Neurological: He is alert and oriented to person, place, and time. He has normal reflexes. Gait normal. GCS score is 15.  Skin: Skin is warm and dry.  Psychiatric: Mood, memory, affect and judgment normal.    Lab Results  Component Value  Date   WBC 9.8 08/10/2014   HGB 12.9* 08/10/2014   HCT 38.6* 08/10/2014   PLT 176 08/10/2014   GLUCOSE 465* 08/10/2014   CHOL 105 11/07/2013   TRIG 126 11/07/2013   HDL 41 11/07/2013   LDLCALC 39 11/07/2013   TSH 0.894 12/31/2013   PSA 0.3 03/31/2012   HGBA1C 12.3 09/04/2014    CMP     Component Value Date/Time   NA 131* 08/10/2014 0136   NA 136* 05/21/2014   NA 136 01/02/2014 0439   K 4.1 08/10/2014 0136   K 4.2 01/02/2014 0439   CL 92* 08/10/2014 0136   CL 101 01/02/2014 0439   CO2 31 08/10/2014 0136   CO2 27 01/02/2014 0439   GLUCOSE 465* 08/10/2014 0136   GLUCOSE 152* 01/02/2014 0439   BUN 29* 08/10/2014 0136  BUN 28* 05/21/2014   BUN 62* 01/02/2014 0439   CREATININE 1.28* 08/10/2014 0136   CREATININE 1.2 05/21/2014   CREATININE 1.96* 01/02/2014 0439   CALCIUM 9.1 08/10/2014 0136   CALCIUM 7.9* 01/02/2014 0439   PROT 6.6 12/30/2013 1800   ALBUMIN 2.7* 12/30/2013 1800   AST 28 12/30/2013 1800   ALT 29 12/30/2013 1800   ALKPHOS 102 12/30/2013 1800   BILITOT 0.5 12/30/2013 1800   GFRNONAA 56* 08/10/2014 0136   GFRNONAA 36* 01/02/2014 0439   GFRNONAA 59* 06/07/2012 0527   GFRAA >60 08/10/2014 0136   GFRAA 44* 01/02/2014 0439   GFRAA >60 06/07/2012 0527    Assessment and Plan :  1. Chest pain, unspecified chest pain type/probable unstable angina Ratio was advised to days ago to go to the ED and decline. This is a chronic problem was a bit which has been occurring all of 2017. We will treat as unstable angina. May well need referral back to cardiology.  - EKG 12-Lead - CBC with Differential/Platelet - Pro b natriuretic peptide (BNP) - Troponin I - isosorbide mononitrate (IMDUR) 60 MG 24 hr tablet; Take 0.5 tablets (30 mg total) by mouth daily.  Dispense: 30 tablet; Refill: 12  2. Shortness of breath/stable congestive heart failure Double diuretic dose and recheck in 1-2 weeks - CBC with Differential/Platelet - Pro b natriuretic peptide (BNP) - DG Chest 2  View; Future  3. Essential hypertension  - TSH  4. DM (diabetes mellitus), secondary (HCC)  - Hemoglobin A1c - Comprehensive metabolic panel  5. Anemia, unspecified anemia type  - CBC with Differential/Platelet  6. Adiposity   7. BPH (benign prostatic hyperplasia)  - tamsulosin (FLOMAX) 0.4 MG CAPS capsule; Take 1 capsule (0.4 mg total) by mouth daily.  Dispense: 30 capsule; Refill: 3  8. Edema, unspecified type  - torsemide (DEMADEX) 20 MG tablet; Take 3 tablets (60 mg total) by mouth daily.  Dispense: 90 tablet; Refill: 12  I have done the exam and reviewed the above chart and it is accurate to the best of my knowledge.  Patient was seen and examined by Dr. Julieanne Manson, and noted scribed by Dimas Chyle, CMA  Julieanne Manson MD [facility] 05/23/2015 4:13 PM

## 2015-05-24 LAB — CBC WITH DIFFERENTIAL/PLATELET
BASOS ABS: 0 10*3/uL (ref 0.0–0.2)
BASOS: 0 %
EOS (ABSOLUTE): 0.4 10*3/uL (ref 0.0–0.4)
Eos: 4 %
HEMOGLOBIN: 7.7 g/dL — AB (ref 12.6–17.7)
Hematocrit: 26.2 % — ABNORMAL LOW (ref 37.5–51.0)
IMMATURE GRANS (ABS): 0 10*3/uL (ref 0.0–0.1)
IMMATURE GRANULOCYTES: 0 %
LYMPHS: 14 %
Lymphocytes Absolute: 1.5 10*3/uL (ref 0.7–3.1)
MCH: 22.5 pg — AB (ref 26.6–33.0)
MCHC: 29.4 g/dL — ABNORMAL LOW (ref 31.5–35.7)
MCV: 77 fL — ABNORMAL LOW (ref 79–97)
MONOCYTES: 14 %
Monocytes Absolute: 1.5 10*3/uL — ABNORMAL HIGH (ref 0.1–0.9)
NEUTROS ABS: 6.9 10*3/uL (ref 1.4–7.0)
NEUTROS PCT: 68 %
PLATELETS: 325 10*3/uL (ref 150–379)
RBC: 3.42 x10E6/uL — ABNORMAL LOW (ref 4.14–5.80)
RDW: 16.8 % — ABNORMAL HIGH (ref 12.3–15.4)
WBC: 10.2 10*3/uL (ref 3.4–10.8)

## 2015-05-24 LAB — COMPREHENSIVE METABOLIC PANEL
A/G RATIO: 1.1 — AB (ref 1.2–2.2)
ALK PHOS: 196 IU/L — AB (ref 39–117)
ALT: 29 IU/L (ref 0–44)
AST: 31 IU/L (ref 0–40)
Albumin: 3.5 g/dL — ABNORMAL LOW (ref 3.6–4.8)
BILIRUBIN TOTAL: 0.5 mg/dL (ref 0.0–1.2)
BUN/Creatinine Ratio: 20 (ref 10–24)
BUN: 25 mg/dL (ref 8–27)
CHLORIDE: 92 mmol/L — AB (ref 96–106)
CO2: 24 mmol/L (ref 18–29)
Calcium: 8.7 mg/dL (ref 8.6–10.2)
Creatinine, Ser: 1.28 mg/dL — ABNORMAL HIGH (ref 0.76–1.27)
GFR calc Af Amer: 66 mL/min/{1.73_m2} (ref >59)
GFR, EST NON AFRICAN AMERICAN: 57 mL/min/{1.73_m2} — AB (ref >59)
Globulin, Total: 3.2 g/dL (ref 1.5–4.5)
Glucose: 335 mg/dL — ABNORMAL HIGH (ref 65–99)
POTASSIUM: 5 mmol/L (ref 3.5–5.2)
SODIUM: 132 mmol/L — AB (ref 134–144)
Total Protein: 6.7 g/dL (ref 6.0–8.5)

## 2015-05-24 LAB — HEMOGLOBIN A1C
Est. average glucose Bld gHb Est-mCnc: 306 mg/dL
HEMOGLOBIN A1C: 12.3 % — AB (ref 4.8–5.6)

## 2015-05-24 LAB — PRO B NATRIURETIC PEPTIDE: NT-Pro BNP: 553 pg/mL — ABNORMAL HIGH (ref 0–376)

## 2015-05-24 LAB — TROPONIN I

## 2015-05-24 LAB — TSH: TSH: 1.19 u[IU]/mL (ref 0.450–4.500)

## 2015-05-24 MED ORDER — FERROUS SULFATE 325 (65 FE) MG PO TABS: 325.0000 mg | ORAL_TABLET | Freq: Two times a day (BID) | ORAL | Status: DC

## 2015-05-24 NOTE — Addendum Note (Signed)
Addended by: Miachel Roux on: 05/24/2015 04:38 PM   Modules accepted: Orders

## 2015-05-29 ENCOUNTER — Ambulatory Visit
Admission: RE | Admit: 2015-05-29 | Discharge: 2015-05-29 | Disposition: A | Discharge status: Home / Self Care | Payer: PPO | Source: Ambulatory Visit | Attending: Family Medicine | Admitting: Family Medicine

## 2015-05-29 DIAGNOSIS — R918 Other nonspecific abnormal finding of lung field: Secondary | ICD-10-CM | POA: Diagnosis not present

## 2015-05-29 DIAGNOSIS — J9811 Atelectasis: Secondary | ICD-10-CM | POA: Insufficient documentation

## 2015-05-29 DIAGNOSIS — R0602 Shortness of breath: Secondary | ICD-10-CM | POA: Insufficient documentation

## 2015-05-30 ENCOUNTER — Encounter: Payer: Self-pay | Admitting: Family Medicine

## 2015-05-30 ENCOUNTER — Ambulatory Visit (INDEPENDENT_AMBULATORY_CARE_PROVIDER_SITE_OTHER): Payer: PPO | Admitting: Family Medicine

## 2015-05-30 VITALS — BP 128/70 | HR 78 | Temp 97.7°F | Resp 20 | Wt 300.0 lb

## 2015-05-30 DIAGNOSIS — J309 Allergic rhinitis, unspecified: Secondary | ICD-10-CM | POA: Diagnosis not present

## 2015-05-30 DIAGNOSIS — R609 Edema, unspecified: Secondary | ICD-10-CM

## 2015-05-30 DIAGNOSIS — R938 Abnormal findings on diagnostic imaging of other specified body structures: Secondary | ICD-10-CM | POA: Diagnosis not present

## 2015-05-30 DIAGNOSIS — R0789 Other chest pain: Secondary | ICD-10-CM | POA: Diagnosis not present

## 2015-05-30 DIAGNOSIS — D649 Anemia, unspecified: Secondary | ICD-10-CM | POA: Diagnosis not present

## 2015-05-30 DIAGNOSIS — R9389 Abnormal findings on diagnostic imaging of other specified body structures: Secondary | ICD-10-CM

## 2015-05-30 DIAGNOSIS — R601 Generalized edema: Secondary | ICD-10-CM | POA: Diagnosis not present

## 2015-05-30 MED ORDER — LORATADINE 10 MG PO TABS: 10.0000 mg | ORAL_TABLET | Freq: Every day | ORAL | Status: DC

## 2015-05-30 MED ORDER — METOLAZONE 5 MG PO TABS: 5.0000 mg | ORAL_TABLET | Freq: Every day | ORAL | Status: DC

## 2015-05-30 MED ORDER — TORSEMIDE 20 MG PO TABS: 40.0000 mg | ORAL_TABLET | Freq: Every day | ORAL | Status: DC

## 2015-05-30 NOTE — Progress Notes (Signed)
Patient ID: Jay [name], male   DOB: May 27, 1945, 70 y.o.   MRN: [id number]       Patient: Jay [name] Male    DOB: 04/08/1945   70 y.o.   MRN: [id number] Visit Date: 05/30/2015  Today's Provider: Megan Mans, MD   No chief complaint on file.  Subjective:    HPI Patient is here today for a follow up:  Patient was seen in the office about 1 week ago c/o chest pain. He reports that he still has occasional chest pain, but it has improved. Patient also mentions that he had his CXR done yesterday.  Patient reports that he still feels bloated and has an "uneasy" feeling in his head. Patient also mentions that he gets very short of breath with exertion.  All of his symptoms are chronic for the past several months. He has chronic dyspnea on exertion and chest tightness with exertion. No diaphoresis. No GI symptoms. No melena or hematochezia.    Allergies  Allergen Reactions  . Metformin Nausea Only   Previous Medications   ASPIRIN 81 MG TABLET    Take 1 tablet by mouth daily.   FERROUS SULFATE 325 (65 FE) MG TABLET    Take 1 tablet (325 mg total) by mouth 2 (two) times daily.   GLIMEPIRIDE (AMARYL) 4 MG TABLET    TAKE 2 TABLETS BY MOUTH TWICE DAILY.   GLIPIZIDE (GLUCOTROL) 5 MG TABLET    Take 1 tablet (5 mg total) by mouth 2 (two) times daily before a meal.   ISOSORBIDE MONONITRATE (IMDUR) 60 MG 24 HR TABLET    Take 0.5 tablets (30 mg total) by mouth daily.   LEVEMIR FLEXTOUCH 100 UNIT/ML PEN    INJECT 85 UNITS SUBCUTANEOUSLY EVERY NIGHT.   MECLIZINE (ANTIVERT) 25 MG TABLET    TAKE 1 TABLET BY MOUTH 3 TIMES DAILY AS NEEDED.   METOPROLOL TARTRATE (LOPRESSOR) 25 MG TABLET    Take 0.5 tablets (12.5 mg total) by mouth 2 (two) times daily.   MOMETASONE (ELOCON) 0.1 % CREAM    Apply 1 application topically daily. For 2 weeks   NYSTATIN CREAM (MYCOSTATIN)    Apply 1 application topically 2 (two) times daily.   PANTOPRAZOLE (PROTONIX) 40 MG TABLET    TAKE 1 TABLET BY MOUTH  TWICE DAILY.   ROSUVASTATIN (CRESTOR) 10 MG TABLET    Take 1 tablet (10 mg total) by mouth daily.   SERTRALINE (ZOLOFT) 50 MG TABLET    TAKE 1 TABLET BY MOUTH ONCE DAILY.   SODIUM CHLORIDE, EXTERNAL, ([facility] SALINE WOUND WASH) 0.9 % SOLN    Apply 2 application topically 2 (two) times daily.   TAMSULOSIN (FLOMAX) 0.4 MG CAPS CAPSULE    Take 1 capsule (0.4 mg total) by mouth daily.   TORSEMIDE (DEMADEX) 20 MG TABLET    TAKE 1 TABLET BY MOUTH TWICE DAILY   TORSEMIDE (DEMADEX) 20 MG TABLET    Take 3 tablets (60 mg total) by mouth daily.   TRADJENTA 5 MG TABS TABLET    TAKE 1 TABLET BY MOUTH ONCE DAILY.    Review of Systems  Psychiatric/Behavioral: Positive for dysphoric mood.       He denies any anxiety or depression. His wife is in very poor health and evidently he has to take care of her and he is says he is exhausted.he is not suicidal or homicidal.    Social History  Substance Use Topics  . Smoking status: Never Smoker   .  Smokeless tobacco: Never Used  . Alcohol Use: No   Objective:   BP 128/70 mmHg  Pulse 78  Temp(Src) 97.7 F (36.5 C)  Resp 20  Wt 300 lb (136.079 kg)  SpO2 94%  Physical Exam  Constitutional: He is oriented to person, place, and time. He appears well-developed and well-nourished.  HENT:  Head: Normocephalic and atraumatic.  Right Ear: External ear normal.  Left Ear: External ear normal.  Nose: Nose normal.  Eyes: Conjunctivae are normal.  Neck: Neck supple. No thyromegaly present.  Cardiovascular: Normal rate and regular rhythm.   Murmur heard. 3/6 murmur.  Pulmonary/Chest: Effort normal and breath sounds normal.  Abdominal: Soft. He exhibits no distension and no mass. There is no tenderness. There is no rebound and no guarding.  Lymphadenopathy:    He has no cervical adenopathy.  Neurological: He is alert and oriented to person, place, and time.  Skin: Skin is warm and dry.  Psychiatric: He has a normal mood and affect. His behavior is normal. Judgment  and thought content normal.        Assessment & Plan:     1. Generalized edema  - metolazone (ZAROXOLYN) 5 MG tablet; Take 1 tablet (5 mg total) by mouth daily.  Dispense: 30 tablet; Refill: 5  2. Edema, unspecified type  - torsemide (DEMADEX) 20 MG tablet; Take 2 tablets (40 mg total) by mouth daily.  Dispense: 60 tablet; Refill: 12  3. Other chest pain  - Ambulatory referral to Cardiology  4. Abnormal chest x-ray Discussed the patient's risk and benefits of CT scan. I'm a little bit worried about renal insult but patient is scared that he might have a cancer in his chest. Follow through with CT scan. - CT Chest W Contrast; Future  5. Anemia, unspecified anemia type Patient had complete GI workup in 2013.I think due to his poor cardiovascular health when he came in with iron deficiency anemia in 2015 no workup was done. The anemia did improve. He now has the iron deficiency anemia again and it is stable and his cardiovascular symptoms are stable. The issue is how far to go with GI workup at this time. He may also need hematology referral. Long-term prognosis is fair to  poor due to cardiovascular disease. - CBC with Differential/Platelet - Renal function panel  6. Allergic rhinitis, unspecified allergic rhinitis type  - loratadine (CLARITIN) 10 MG tablet; Take 1 tablet (10 mg total) by mouth daily.  Dispense: 30 tablet; Refill: 11      I have done the exam and reviewed the above chart and it is accurate to the best of my knowledge.  [name], MD  [facility]

## 2015-05-30 NOTE — Patient Instructions (Addendum)
Decrease Torsemide to  20mg  (2) tablets once daily.

## 2015-05-31 ENCOUNTER — Telehealth: Payer: Self-pay

## 2015-05-31 LAB — RENAL FUNCTION PANEL
ALBUMIN: 3.7 g/dL (ref 3.6–4.8)
BUN/Creatinine Ratio: 21 (ref 10–24)
BUN: 26 mg/dL (ref 8–27)
CALCIUM: 8.8 mg/dL (ref 8.6–10.2)
CO2: 28 mmol/L (ref 18–29)
CREATININE: 1.26 mg/dL (ref 0.76–1.27)
Chloride: 94 mmol/L — ABNORMAL LOW (ref 96–106)
GFR calc Af Amer: 67 mL/min/{1.73_m2} (ref >59)
GFR calc non Af Amer: 58 mL/min/{1.73_m2} — ABNORMAL LOW (ref >59)
GLUCOSE: 187 mg/dL — AB (ref 65–99)
PHOSPHORUS: 3.4 mg/dL (ref 2.5–4.5)
POTASSIUM: 4.5 mmol/L (ref 3.5–5.2)
SODIUM: 137 mmol/L (ref 134–144)

## 2015-05-31 LAB — CBC WITH DIFFERENTIAL/PLATELET
BASOS: 0 %
Basophils Absolute: 0 10*3/uL (ref 0.0–0.2)
EOS (ABSOLUTE): 0.2 10*3/uL (ref 0.0–0.4)
Eos: 2 %
HEMOGLOBIN: 7.8 g/dL — AB (ref 12.6–17.7)
Hematocrit: 26 % — ABNORMAL LOW (ref 37.5–51.0)
IMMATURE GRANS (ABS): 0.1 10*3/uL (ref 0.0–0.1)
IMMATURE GRANULOCYTES: 1 %
LYMPHS: 15 %
Lymphocytes Absolute: 1.3 10*3/uL (ref 0.7–3.1)
MCH: 22.7 pg — AB (ref 26.6–33.0)
MCHC: 30 g/dL — ABNORMAL LOW (ref 31.5–35.7)
MCV: 76 fL — AB (ref 79–97)
MONOCYTES: 14 %
Monocytes Absolute: 1.2 10*3/uL — ABNORMAL HIGH (ref 0.1–0.9)
NEUTROS ABS: 6.2 10*3/uL (ref 1.4–7.0)
NEUTROS PCT: 68 %
PLATELETS: 290 10*3/uL (ref 150–379)
RBC: 3.43 x10E6/uL — ABNORMAL LOW (ref 4.14–5.80)
RDW: 18.9 % — ABNORMAL HIGH (ref 12.3–15.4)
WBC: 9 10*3/uL (ref 3.4–10.8)

## 2015-05-31 NOTE — Telephone Encounter (Signed)
-----   Message from Jay [name]., MD sent at 05/31/2015  4:30 PM EDT ----- Everything stable but if patient feels worse he may need to go to ED because of his anemia. Repeat CBC next week

## 2015-05-31 NOTE — Telephone Encounter (Signed)
Tried calling patient and line was busy. Will try again later.  

## 2015-06-03 DIAGNOSIS — I350 Nonrheumatic aortic (valve) stenosis: Secondary | ICD-10-CM | POA: Diagnosis not present

## 2015-06-03 DIAGNOSIS — R0602 Shortness of breath: Secondary | ICD-10-CM | POA: Diagnosis not present

## 2015-06-03 DIAGNOSIS — R600 Localized edema: Secondary | ICD-10-CM | POA: Diagnosis not present

## 2015-06-03 DIAGNOSIS — I480 Paroxysmal atrial fibrillation: Secondary | ICD-10-CM | POA: Diagnosis not present

## 2015-06-03 NOTE — Telephone Encounter (Signed)
lmtcb-aa 

## 2015-06-03 NOTE — Telephone Encounter (Signed)
No answer and unable to leave message-aa

## 2015-06-03 NOTE — Telephone Encounter (Signed)
Pt is returning call. CB#[phone]/MW

## 2015-06-05 NOTE — Telephone Encounter (Signed)
Called both numbers and no answer-aa

## 2015-06-06 ENCOUNTER — Encounter: Payer: Self-pay | Admitting: Family Medicine

## 2015-06-06 ENCOUNTER — Ambulatory Visit (INDEPENDENT_AMBULATORY_CARE_PROVIDER_SITE_OTHER): Payer: PPO | Admitting: Family Medicine

## 2015-06-06 VITALS — BP 124/62 | HR 72 | Temp 98.1°F | Resp 20 | Wt 290.0 lb

## 2015-06-06 DIAGNOSIS — R0602 Shortness of breath: Secondary | ICD-10-CM | POA: Diagnosis not present

## 2015-06-06 DIAGNOSIS — D649 Anemia, unspecified: Secondary | ICD-10-CM | POA: Diagnosis not present

## 2015-06-06 DIAGNOSIS — J0101 Acute recurrent maxillary sinusitis: Secondary | ICD-10-CM | POA: Diagnosis not present

## 2015-06-06 DIAGNOSIS — R601 Generalized edema: Secondary | ICD-10-CM | POA: Diagnosis not present

## 2015-06-06 MED ORDER — AMOXICILLIN 500 MG PO CAPS: 500.0000 mg | ORAL_CAPSULE | Freq: Three times a day (TID) | ORAL | Status: DC

## 2015-06-06 NOTE — Progress Notes (Signed)
Patient ID: Jay [name], male   DOB: [birth year], 70 y.o.   MRN: [id number]       Patient: Jay [name] Male    DOB: 1945-06-25   70 y.o.   MRN: [id number] Visit Date: 06/06/2015  Today's Provider: Megan Mans, MD   Chief Complaint  Patient presents with  . Edema    follow up    Subjective:    HPI Patient comes in today to follow up on Edema. On last visit torsemide was decreased to 20mg  2 tablets once daily, and start Zaroxolyn 5mg . Patient reports that he has tolerated medication well. He has also lost 10lbs since LOV.  Patient reports that his shortness of breath is not any better. He still gets out of breath with exertion.  He is undergoing workup by Dr. Gwen Pounds    Allergies  Allergen Reactions  . Metformin Nausea Only   Previous Medications   ASPIRIN 81 MG TABLET    Take 1 tablet by mouth daily.   FERROUS SULFATE 325 (65 FE) MG TABLET    Take 1 tablet (325 mg total) by mouth 2 (two) times daily.   GLIMEPIRIDE (AMARYL) 4 MG TABLET    TAKE 2 TABLETS BY MOUTH TWICE DAILY.   GLIPIZIDE (GLUCOTROL) 5 MG TABLET    Take 1 tablet (5 mg total) by mouth 2 (two) times daily before a meal.   ISOSORBIDE MONONITRATE (IMDUR) 60 MG 24 HR TABLET    Take 0.5 tablets (30 mg total) by mouth daily.   LEVEMIR FLEXTOUCH 100 UNIT/ML PEN    INJECT 85 UNITS SUBCUTANEOUSLY EVERY NIGHT.   LORATADINE (CLARITIN) 10 MG TABLET    Take 1 tablet (10 mg total) by mouth daily.   MECLIZINE (ANTIVERT) 25 MG TABLET    TAKE 1 TABLET BY MOUTH 3 TIMES DAILY AS NEEDED.   METOLAZONE (ZAROXOLYN) 5 MG TABLET    Take 1 tablet (5 mg total) by mouth daily.   METOPROLOL TARTRATE (LOPRESSOR) 25 MG TABLET    Take 0.5 tablets (12.5 mg total) by mouth 2 (two) times daily.   MOMETASONE (ELOCON) 0.1 % CREAM    Apply 1 application topically daily. For 2 weeks   NYSTATIN CREAM (MYCOSTATIN)    Apply 1 application topically 2 (two) times daily.   PANTOPRAZOLE (PROTONIX) 40 MG TABLET    TAKE 1 TABLET BY MOUTH TWICE  DAILY.   ROSUVASTATIN (CRESTOR) 10 MG TABLET    Take 1 tablet (10 mg total) by mouth daily.   SERTRALINE (ZOLOFT) 50 MG TABLET    TAKE 1 TABLET BY MOUTH ONCE DAILY.   SODIUM CHLORIDE, EXTERNAL, ([employer] SALINE WOUND WASH) 0.9 % SOLN    Apply 2 application topically 2 (two) times daily.   TAMSULOSIN (FLOMAX) 0.4 MG CAPS CAPSULE    Take 1 capsule (0.4 mg total) by mouth daily.   TORSEMIDE (DEMADEX) 20 MG TABLET    Take 2 tablets (40 mg total) by mouth daily.   TRADJENTA 5 MG TABS TABLET    TAKE 1 TABLET BY MOUTH ONCE DAILY.    Review of Systems  Constitutional: Positive for fatigue.  HENT: Negative.   Eyes: Negative.   Respiratory: Positive for shortness of breath.   Endocrine: Negative.   Musculoskeletal: Negative.   Skin: Negative.   Allergic/Immunologic: Negative.   Neurological: Negative.   Hematological: Negative.   Psychiatric/Behavioral: Negative.     Social History  Substance Use Topics  . Smoking status: Never Smoker   . Smokeless tobacco:  Never Used  . Alcohol Use: No   Objective:   BP 124/62 mmHg  Pulse 72  Temp(Src) 98.1 F (36.7 C)  Resp 20  Wt 290 lb (131.543 kg)  SpO2 93%  Physical Exam      Assessment & Plan:     1. Generalized edema Mild CHF as his morbid obesity with chronic venous stasis changes. Last BMP was normal. Discussed with patient the accommodation of diuretics has helped the swelling I'm not sure risk of kidney damage.  2. Anemia, unspecified anemia type Hematology if he does not go up with CBC. May need parenteral iron.   3. Shortness of breath 4. Ischemic cardiomyopathy Presently undergoing workup by cardiology. 5. Known AVMs of the GI tract Probably will not need hematology referral unless he does not absorb the iron. 6. Morbid obesity More than 50% of this visit spent in counseling.      Oryn Wendelyn Breslow, MD  [facility]

## 2015-06-06 NOTE — Telephone Encounter (Signed)
Will address during his f/u today-aa

## 2015-06-07 ENCOUNTER — Ambulatory Visit
Admission: RE | Admit: 2015-06-07 | Discharge: 2015-06-07 | Disposition: A | Discharge status: Home / Self Care | Payer: PPO | Source: Ambulatory Visit | Attending: Family Medicine | Admitting: Family Medicine

## 2015-06-07 ENCOUNTER — Telehealth: Payer: Self-pay

## 2015-06-07 DIAGNOSIS — R938 Abnormal findings on diagnostic imaging of other specified body structures: Secondary | ICD-10-CM | POA: Diagnosis not present

## 2015-06-07 DIAGNOSIS — R9389 Abnormal findings on diagnostic imaging of other specified body structures: Secondary | ICD-10-CM

## 2015-06-07 DIAGNOSIS — K8020 Calculus of gallbladder without cholecystitis without obstruction: Secondary | ICD-10-CM | POA: Diagnosis not present

## 2015-06-07 DIAGNOSIS — I2510 Atherosclerotic heart disease of native coronary artery without angina pectoris: Secondary | ICD-10-CM | POA: Insufficient documentation

## 2015-06-07 DIAGNOSIS — R918 Other nonspecific abnormal finding of lung field: Secondary | ICD-10-CM | POA: Diagnosis not present

## 2015-06-07 HISTORY — DX: Essential (primary) hypertension: I10

## 2015-06-07 LAB — RENAL FUNCTION PANEL
Albumin: 3.7 g/dL (ref 3.6–4.8)
BUN / CREAT RATIO: 21 (ref 10–24)
BUN: 27 mg/dL (ref 8–27)
CALCIUM: 8.9 mg/dL (ref 8.6–10.2)
CO2: 30 mmol/L — ABNORMAL HIGH (ref 18–29)
CREATININE: 1.31 mg/dL — AB (ref 0.76–1.27)
Chloride: 92 mmol/L — ABNORMAL LOW (ref 96–106)
GFR calc Af Amer: 64 mL/min/{1.73_m2} (ref >59)
GFR calc non Af Amer: 56 mL/min/{1.73_m2} — ABNORMAL LOW (ref >59)
Glucose: 367 mg/dL — ABNORMAL HIGH (ref 65–99)
PHOSPHORUS: 3.1 mg/dL (ref 2.5–4.5)
Potassium: 4.3 mmol/L (ref 3.5–5.2)
Sodium: 138 mmol/L (ref 134–144)

## 2015-06-07 LAB — CBC WITH DIFFERENTIAL/PLATELET
Basophils Absolute: 0 10*3/uL (ref 0.0–0.2)
Basos: 1 %
EOS (ABSOLUTE): 0.3 10*3/uL (ref 0.0–0.4)
Eos: 3 %
HEMATOCRIT: 30.6 % — AB (ref 37.5–51.0)
Hemoglobin: 9 g/dL — ABNORMAL LOW (ref 12.6–17.7)
IMMATURE GRANS (ABS): 0 10*3/uL (ref 0.0–0.1)
Immature Granulocytes: 0 %
LYMPHS: 15 %
Lymphocytes Absolute: 1.2 10*3/uL (ref 0.7–3.1)
MCH: 23.7 pg — ABNORMAL LOW (ref 26.6–33.0)
MCHC: 29.4 g/dL — ABNORMAL LOW (ref 31.5–35.7)
MCV: 81 fL (ref 79–97)
MONOCYTES: 13 %
Monocytes Absolute: 1.1 10*3/uL — ABNORMAL HIGH (ref 0.1–0.9)
NEUTROS ABS: 5.7 10*3/uL (ref 1.4–7.0)
NEUTROS PCT: 68 %
PLATELETS: 271 10*3/uL (ref 150–379)
RBC: 3.8 x10E6/uL — ABNORMAL LOW (ref 4.14–5.80)
RDW: 22.1 % — ABNORMAL HIGH (ref 12.3–15.4)
WBC: 8.2 10*3/uL (ref 3.4–10.8)

## 2015-06-07 LAB — IRON: Iron: 119 ug/dL (ref 38–169)

## 2015-06-07 MED ORDER — IOPAMIDOL (ISOVUE-300) INJECTION 61%
75.0000 mL | Freq: Once | INTRAVENOUS | Status: AC | PRN
  Administered 2015-06-07: 75 mL via INTRAVENOUS

## 2015-06-07 NOTE — Telephone Encounter (Signed)
Pt advised that his anemia is improving per labs and to continue iron tablets. Per dr Aldean Ast

## 2015-06-10 ENCOUNTER — Telehealth: Payer: Self-pay

## 2015-06-10 NOTE — Telephone Encounter (Signed)
Tried calling patient and no answer. Unable to leave VM due to mailbox being full. Will try again later.  

## 2015-06-10 NOTE — Telephone Encounter (Signed)
-----   Message from Maple Hudson., MD sent at 06/09/2015  6:26 AM EDT ----- Chest CT was lungs changing suggestive of possible infection.right upper lobe nodule present. Repeat CT in 6 months.for her to pulmonary for CT changes in lungs

## 2015-06-10 NOTE — Telephone Encounter (Signed)
No answer-aa 

## 2015-06-11 DIAGNOSIS — I480 Paroxysmal atrial fibrillation: Secondary | ICD-10-CM | POA: Diagnosis not present

## 2015-06-13 NOTE — Telephone Encounter (Signed)
No answer and unable to leave a message-aa 

## 2015-06-17 NOTE — Telephone Encounter (Signed)
lmtcb-with his wife-aa 

## 2015-06-18 NOTE — Telephone Encounter (Signed)
lmtcb with his wife again, stressed the importance to the wife that patient call us back today. Have been trying to call patient since may 8th-aa

## 2015-06-19 DIAGNOSIS — I350 Nonrheumatic aortic (valve) stenosis: Secondary | ICD-10-CM | POA: Diagnosis not present

## 2015-06-19 DIAGNOSIS — R0602 Shortness of breath: Secondary | ICD-10-CM | POA: Diagnosis not present

## 2015-06-19 DIAGNOSIS — I10 Essential (primary) hypertension: Secondary | ICD-10-CM | POA: Diagnosis not present

## 2015-06-19 DIAGNOSIS — Z01818 Encounter for other preprocedural examination: Secondary | ICD-10-CM | POA: Diagnosis not present

## 2015-06-19 DIAGNOSIS — I25118 Atherosclerotic heart disease of native coronary artery with other forms of angina pectoris: Secondary | ICD-10-CM | POA: Diagnosis not present

## 2015-06-24 DIAGNOSIS — I200 Unstable angina: Secondary | ICD-10-CM | POA: Diagnosis present

## 2015-06-27 ENCOUNTER — Ambulatory Visit: Admission: RE | Admit: 2015-06-27 | Payer: PPO | Source: Ambulatory Visit | Admitting: Internal Medicine

## 2015-06-27 ENCOUNTER — Encounter: Admission: RE | Payer: Self-pay | Source: Ambulatory Visit

## 2015-06-27 SURGERY — LEFT HEART CATH AND CORONARY ANGIOGRAPHY
Anesthesia: Moderate Sedation | Laterality: Left

## 2015-07-09 ENCOUNTER — Ambulatory Visit: Payer: PPO | Admitting: Family Medicine

## 2015-07-09 NOTE — Care Management (Addendum)
Received call from Dr. Standley Brooking office requesting assistance with transportation and care of his wife. He has cancelled this much needed procedure once and tried to cancel tomorrow's appointment. This RNCM attempted to contact patient at [phone]- spouse number listed has been disconnected. Message left at [phone] number to call this RNCM.

## 2015-07-10 ENCOUNTER — Ambulatory Visit
Admission: RE | Admit: 2015-07-10 | Discharge: 2015-07-10 | Disposition: A | Discharge status: Home / Self Care | Payer: PPO | Source: Ambulatory Visit | Attending: Internal Medicine | Admitting: Internal Medicine

## 2015-07-10 ENCOUNTER — Encounter
Admission: RE | Disposition: A | Discharge status: Home / Self Care | Payer: Self-pay | Source: Ambulatory Visit | Attending: Internal Medicine

## 2015-07-10 DIAGNOSIS — E119 Type 2 diabetes mellitus without complications: Secondary | ICD-10-CM | POA: Diagnosis not present

## 2015-07-10 DIAGNOSIS — K760 Fatty (change of) liver, not elsewhere classified: Secondary | ICD-10-CM | POA: Diagnosis not present

## 2015-07-10 DIAGNOSIS — I259 Chronic ischemic heart disease, unspecified: Secondary | ICD-10-CM | POA: Diagnosis not present

## 2015-07-10 DIAGNOSIS — Z7982 Long term (current) use of aspirin: Secondary | ICD-10-CM | POA: Diagnosis not present

## 2015-07-10 DIAGNOSIS — E7800 Pure hypercholesterolemia, unspecified: Secondary | ICD-10-CM | POA: Diagnosis not present

## 2015-07-10 DIAGNOSIS — I25119 Atherosclerotic heart disease of native coronary artery with unspecified angina pectoris: Secondary | ICD-10-CM | POA: Diagnosis not present

## 2015-07-10 DIAGNOSIS — R609 Edema, unspecified: Secondary | ICD-10-CM | POA: Diagnosis not present

## 2015-07-10 DIAGNOSIS — E785 Hyperlipidemia, unspecified: Secondary | ICD-10-CM | POA: Diagnosis not present

## 2015-07-10 DIAGNOSIS — Z6841 Body Mass Index (BMI) 40.0 and over, adult: Secondary | ICD-10-CM | POA: Diagnosis not present

## 2015-07-10 DIAGNOSIS — I200 Unstable angina: Secondary | ICD-10-CM | POA: Diagnosis present

## 2015-07-10 DIAGNOSIS — Z9889 Other specified postprocedural states: Secondary | ICD-10-CM | POA: Insufficient documentation

## 2015-07-10 DIAGNOSIS — Z79899 Other long term (current) drug therapy: Secondary | ICD-10-CM | POA: Diagnosis not present

## 2015-07-10 DIAGNOSIS — E11319 Type 2 diabetes mellitus with unspecified diabetic retinopathy without macular edema: Secondary | ICD-10-CM | POA: Insufficient documentation

## 2015-07-10 DIAGNOSIS — G4730 Sleep apnea, unspecified: Secondary | ICD-10-CM | POA: Diagnosis not present

## 2015-07-10 DIAGNOSIS — Z794 Long term (current) use of insulin: Secondary | ICD-10-CM | POA: Insufficient documentation

## 2015-07-10 HISTORY — DX: Atherosclerotic heart disease of native coronary artery without angina pectoris: I25.10

## 2015-07-10 HISTORY — DX: Sleep apnea, unspecified: G47.30

## 2015-07-10 HISTORY — DX: Anemia, unspecified: D64.9

## 2015-07-10 HISTORY — DX: Type 2 diabetes mellitus with unspecified diabetic retinopathy without macular edema: E11.319

## 2015-07-10 HISTORY — DX: Angina pectoris, unspecified: I20.9

## 2015-07-10 HISTORY — DX: Congenital deformity of feet, unspecified, unspecified foot: Q66.90

## 2015-07-10 HISTORY — DX: Sick sinus syndrome: I49.5

## 2015-07-10 HISTORY — DX: Morbid (severe) obesity due to excess calories: E66.01

## 2015-07-10 HISTORY — DX: Fatty (change of) liver, not elsewhere classified: K76.0

## 2015-07-10 HISTORY — DX: Localized edema: R60.0

## 2015-07-10 HISTORY — DX: Gastro-esophageal reflux disease without esophagitis: K21.9

## 2015-07-10 HISTORY — PX: CARDIAC CATHETERIZATION: SHX172

## 2015-07-10 LAB — GLUCOSE, CAPILLARY: GLUCOSE-CAPILLARY: 397 mg/dL — AB (ref 65–99)

## 2015-07-10 SURGERY — LEFT HEART CATH AND CORONARY ANGIOGRAPHY
Anesthesia: Moderate Sedation | Laterality: Left

## 2015-07-10 MED ORDER — MIDAZOLAM HCL 2 MG/2ML IJ SOLN
INTRAMUSCULAR | Status: DC | PRN
  Administered 2015-07-10: 1 mg via INTRAVENOUS

## 2015-07-10 MED ORDER — INSULIN REGULAR HUMAN 100 UNIT/ML IJ SOLN
8.0000 [IU] | Freq: Once | INTRAMUSCULAR | Status: AC
  Administered 2015-07-10: 8 [IU] via SUBCUTANEOUS

## 2015-07-10 MED ORDER — MIDAZOLAM HCL 2 MG/2ML IJ SOLN
INTRAMUSCULAR | Status: AC
  Filled 2015-07-10: qty 2

## 2015-07-10 MED ORDER — HEPARIN (PORCINE) IN NACL 2-0.9 UNIT/ML-% IJ SOLN
INTRAMUSCULAR | Status: AC
  Filled 2015-07-10: qty 1000

## 2015-07-10 MED ORDER — SODIUM CHLORIDE 0.9 % IV SOLN: 250.0000 mL | INTRAVENOUS | Status: DC | PRN

## 2015-07-10 MED ORDER — INSULIN ASPART 100 UNIT/ML ~~LOC~~ SOLN
SUBCUTANEOUS | Status: AC
  Filled 2015-07-10: qty 8

## 2015-07-10 MED ORDER — SODIUM CHLORIDE 0.9% FLUSH: 3.0000 mL | Freq: Two times a day (BID) | INTRAVENOUS | Status: DC

## 2015-07-10 MED ORDER — IOPAMIDOL (ISOVUE-300) INJECTION 61%
INTRAVENOUS | Status: DC | PRN
  Administered 2015-07-10: 130 mL via INTRA_ARTERIAL

## 2015-07-10 MED ORDER — SODIUM CHLORIDE 0.9 % WEIGHT BASED INFUSION
396.0000 mL/h | INTRAVENOUS | Status: DC
Start: 2015-07-11 — End: 2015-07-10
  Administered 2015-07-10: 3 mL/kg/h via INTRAVENOUS

## 2015-07-10 MED ORDER — SODIUM CHLORIDE 0.9 % WEIGHT BASED INFUSION: 3.0000 mL/kg/h | INTRAVENOUS | Status: DC

## 2015-07-10 MED ORDER — ACETAMINOPHEN 325 MG PO TABS: 650.0000 mg | ORAL_TABLET | ORAL | Status: DC | PRN

## 2015-07-10 MED ORDER — ONDANSETRON HCL 4 MG/2ML IJ SOLN: 4.0000 mg | Freq: Four times a day (QID) | INTRAMUSCULAR | Status: DC | PRN

## 2015-07-10 MED ORDER — ASPIRIN 81 MG PO CHEW
CHEWABLE_TABLET | ORAL | Status: AC
  Administered 2015-07-10: 81 mg via ORAL
  Filled 2015-07-10: qty 1

## 2015-07-10 MED ORDER — SODIUM CHLORIDE 0.9 % WEIGHT BASED INFUSION: 132.0000 mL/h | INTRAVENOUS | Status: DC

## 2015-07-10 MED ORDER — FENTANYL CITRATE (PF) 100 MCG/2ML IJ SOLN
INTRAMUSCULAR | Status: DC | PRN
  Administered 2015-07-10: 50 ug via INTRAVENOUS

## 2015-07-10 MED ORDER — SODIUM CHLORIDE 0.9% FLUSH: 3.0000 mL | INTRAVENOUS | Status: DC | PRN

## 2015-07-10 MED ORDER — ASPIRIN 81 MG PO CHEW
81.0000 mg | CHEWABLE_TABLET | ORAL | Status: AC
  Administered 2015-07-10: 81 mg via ORAL

## 2015-07-10 MED ORDER — FENTANYL CITRATE (PF) 100 MCG/2ML IJ SOLN
INTRAMUSCULAR | Status: AC
  Filled 2015-07-10: qty 2

## 2015-07-10 SURGICAL SUPPLY — 10 items
CATH INFINITI 5FR ANG PIGTAIL (CATHETERS) ×3 IMPLANT
CATH INFINITI 5FR JL4 (CATHETERS) ×3 IMPLANT
CATH INFINITI 5FR JL5 (CATHETERS) ×3 IMPLANT
CATH INFINITI JR4 5F (CATHETERS) ×3 IMPLANT
DEVICE CLOSURE MYNXGRIP 5F (Vascular Products) ×3 IMPLANT
KIT MANI 3VAL PERCEP (MISCELLANEOUS) ×3 IMPLANT
NEEDLE PERC 18GX7CM (NEEDLE) ×3 IMPLANT
PACK CARDIAC CATH (CUSTOM PROCEDURE TRAY) ×3 IMPLANT
SHEATH PINNACLE 5F 10CM (SHEATH) ×3 IMPLANT
WIRE EMERALD 3MM-J .035X150CM (WIRE) ×3 IMPLANT

## 2015-07-10 NOTE — Discharge Instructions (Signed)

## 2015-07-10 NOTE — Progress Notes (Signed)
Patient's blood glucose elevated, ECG > 30days. Kowalski aware, no new orders.

## 2015-07-10 NOTE — Progress Notes (Signed)
Pt clinically stable post heart cath, Dr Gwen Pounds in to speak with pt with questions answered, right groin without bleeding nor hematoma, sr per monitor,discharge instructions given with return appt, given,son to come and pick father up upon discharge.

## 2015-07-16 ENCOUNTER — Ambulatory Visit (INDEPENDENT_AMBULATORY_CARE_PROVIDER_SITE_OTHER): Payer: PPO | Admitting: Family Medicine

## 2015-07-16 VITALS — BP 102/76 | HR 110 | Temp 98.3°F | Resp 20 | Wt 265.0 lb

## 2015-07-16 DIAGNOSIS — E669 Obesity, unspecified: Secondary | ICD-10-CM

## 2015-07-16 DIAGNOSIS — R0602 Shortness of breath: Secondary | ICD-10-CM

## 2015-07-16 DIAGNOSIS — E139 Other specified diabetes mellitus without complications: Secondary | ICD-10-CM

## 2015-07-16 DIAGNOSIS — I10 Essential (primary) hypertension: Secondary | ICD-10-CM

## 2015-07-16 DIAGNOSIS — D649 Anemia, unspecified: Secondary | ICD-10-CM

## 2015-07-16 DIAGNOSIS — R918 Other nonspecific abnormal finding of lung field: Secondary | ICD-10-CM | POA: Diagnosis not present

## 2015-07-16 DIAGNOSIS — R42 Dizziness and giddiness: Secondary | ICD-10-CM

## 2015-07-16 MED ORDER — PROMETHAZINE HCL 25 MG PO TABS: 25.0000 mg | ORAL_TABLET | ORAL | Status: DC | PRN

## 2015-07-16 NOTE — Patient Instructions (Signed)
Stop Torsemide and stop Imdur. Follow up 1 week.

## 2015-07-16 NOTE — Progress Notes (Signed)
Patient ID: Jay [name], male   DOB: 1946/01/22, 70 y.o.   MRN: [id number]    Subjective:  HPI  Patient is here for follow up. Last visit was in May 2016. Shortness of breath. Off and on chest pain, weakness is the same. Dizziness has gotten worse in the past 2 days especiall-describes this as "I feel like I am moving around". Feels worse with standing and better with laying down. On further questioning the dizziness does get worse when he initially stands up. He saw Dr. Gwen Pounds since last visit with [place] and was advised heart was ok. Recent CT chest lungs showed lung changes-recommendation to repeat CT in 3 to 6 months. Overall the patient just does not feel well but he has not felt well in many years. Prior to Admission medications   Medication Sig Start Date End Date Taking? Authorizing Provider  aspirin 81 MG tablet Take 1 tablet by mouth daily. 12/01/10   Historical Provider, Jay [name]  ferrous sulfate 325 (65 FE) MG tablet Take 1 tablet (325 mg total) by mouth 2 (two) times daily. 05/24/15   Jay Jay Shouts., Jay [name]  glimepiride (AMARYL) 4 MG tablet TAKE 2 TABLETS BY MOUTH TWICE DAILY. 02/14/15   Jay Jay Shouts., Jay [name]  glipiZIDE (GLUCOTROL) 5 MG tablet Take 1 tablet (5 mg total) by mouth 2 (two) times daily before a meal. 09/04/14   Jay Hudson., Jay [name]  isosorbide mononitrate (IMDUR) 60 MG 24 hr tablet Take 0.5 tablets (30 mg total) by mouth daily. 05/23/15   Jay Jay Shouts., Jay [name]  LEVEMIR FLEXTOUCH 100 UNIT/ML Pen INJECT 85 UNITS SUBCUTANEOUSLY EVERY NIGHT. 05/14/15   Jay Phenix, Jay [name]  loratadine (CLARITIN) 10 MG tablet Take 1 tablet (10 mg total) by mouth daily. 05/30/15   Jay Jay Shouts., Jay [name]  meclizine (ANTIVERT) 25 MG tablet TAKE 1 TABLET BY MOUTH 3 TIMES DAILY AS NEEDED. 11/02/14   Jay Hudson., Jay [name]  metoprolol tartrate (LOPRESSOR) 25 MG tablet Take 0.5 tablets (12.5 mg total) by mouth 2 (two) times daily. Patient not taking: Reported on 07/10/2015 04/17/15   Jay Hudson., Jay [name]  mometasone (ELOCON) 0.1 % cream Apply 1 application topically daily. For 2 weeks 03/26/14   Historical Provider, Jay [name]  nystatin cream (MYCOSTATIN) Apply 1 application topically 2 (two) times daily. Patient not taking: Reported on 05/23/2015 09/04/14   Jay Hudson., Jay [name]  pantoprazole (PROTONIX) 40 MG tablet TAKE 1 TABLET BY MOUTH TWICE DAILY. 01/12/15   Jay Jay Shouts., Jay [name]  rosuvastatin (CRESTOR) 10 MG tablet Take 1 tablet (10 mg total) by mouth daily. 02/14/15   Jay Jay Shouts., Jay [name]  sertraline (ZOLOFT) 50 MG tablet TAKE 1 TABLET BY MOUTH ONCE DAILY. Patient not taking: Reported on 07/10/2015 02/14/15   Jay Hudson., Jay [name]  SODIUM CHLORIDE, EXTERNAL, ([employer] SALINE WOUND WASH) 0.9 % SOLN Apply 2 application topically 2 (two) times daily. 08/17/14   Jay Gurney, Jay [name]  tamsulosin (FLOMAX) 0.4 MG CAPS capsule Take 1 capsule (0.4 mg total) by mouth daily. 05/23/15   [name], Jay [name]  torsemide (DEMADEX) 20 MG tablet Take 2 tablets (40 mg total) by mouth daily. 05/30/15   [name], Jay [name]  TRADJENTA 5 MG TABS tablet TAKE 1 TABLET BY MOUTH ONCE DAILY. 03/18/15   Jay Jay Shouts., Jay [name]    Patient Active Problem List   Diagnosis Date Noted  . Unstable angina (HCC) 06/24/2015  .  Diabetes mellitus (HCC) 08/20/2014  . Wound infection (HCC) 08/17/2014  . Acute kidney failure (HCC) 05/23/2014  . Actinic keratosis 05/23/2014  . Absolute anemia 05/23/2014  . CAD in native artery 05/23/2014  . Depression, major, recurrent (HCC) 05/23/2014  . DM (diabetes mellitus), secondary (HCC) 05/23/2014  . Diabetic retinopathy (HCC) 05/23/2014  . Abnormal LFTs 05/23/2014  . Fatty infiltration of liver 05/23/2014  . Acid reflux 05/23/2014  . BP (high blood pressure) 05/23/2014  . HLD (hyperlipidemia) 05/23/2014  . Anemia, iron deficiency 05/23/2014  . Metal bone fixation hardware in place 05/23/2014  . Adiposity 05/23/2014  . Obstructive apnea 05/23/2014  .  Plaque psoriasis 05/23/2014  . 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 AF (paroxysmal atrial fibrillation) (HCC) 03/01/2014  . Aortic heart valve narrowing 01/05/2014  . Chronic kidney disease (CKD), stage IV (severe) (HCC) 01/05/2014  . Benign essential HTN 01/05/2014    Past Medical History  Diagnosis Date  . Diabetes mellitus without complication (HCC)   . Hypertension   . Anemia   . Angina pectoris (HCC)   . CAD (coronary artery disease)   . Foot deformities, congenital   . Fatty liver   . GERD (gastroesophageal reflux disease)   . Bilateral diabetic retinopathy (HCC)   . Morbid obesity (HCC)   . Pedal edema   . Sinoatrial node dysfunction (HCC)   . Sleep apnea     not tolerating CPAP    Social History   Social History  . Marital Status: Married    Spouse Name: N/A  . Number of Children: N/A  . Years of Education: N/A   Occupational History  . Not on file.   Social History Main Topics  . Smoking status: Never Smoker   . Smokeless tobacco: Never Used  . Alcohol Use: No  . Drug Use: No  . Sexual Activity: No   Other Topics Concern  . Not on file   Social History Narrative    Allergies  Allergen Reactions  . Metformin Nausea Only    Review of Systems  Constitutional: Positive for malaise/fatigue.  Respiratory: Positive for shortness of breath and wheezing.   Cardiovascular: Positive for chest pain.  Gastrointestinal: Positive for vomiting.  Musculoskeletal: Positive for joint pain.  Neurological: Positive for dizziness and weakness.  Psychiatric/Behavioral: Positive for depression.    Immunization History  Administered Date(s) Administered  . Td 11/26/2000   Objective:  BP 102/76 mmHg  Pulse 110  Temp(Src) 98.3 F (36.8 C)  Resp 20  Wt 265 lb (120.203 kg)  SpO2 97%  Physical Exam  Constitutional: He is oriented to person, place, and time and well-developed, well-nourished, and in no distress.  Obese white male in no acute distress.  HENT:    Head: Normocephalic and atraumatic.  Right Ear: External ear normal.  Nose: Nose normal.  Mouth/Throat: Oropharynx is clear and moist.  Eyes: Conjunctivae and EOM are normal. No scleral icterus.  Neck: Neck supple.  Cardiovascular: Normal rate, regular rhythm and normal heart sounds.   Pulmonary/Chest: Effort normal and breath sounds normal.  Abdominal: Soft.  Neurological: He is alert and oriented to person, place, and time. No cranial nerve deficit. He exhibits normal muscle tone. Coordination normal.  Grossly nonfocal neurologic exam today.  Skin: Skin is warm and dry.  Psychiatric: Mood, memory, affect and judgment normal.    Lab Results  Component Value Date   WBC 8.2 06/06/2015   HGB 12.9* 08/10/2014   HCT 30.6* 06/06/2015   PLT 271 06/06/2015  GLUCOSE 367* 06/06/2015   CHOL 105 11/07/2013   TRIG 126 11/07/2013   HDL 41 11/07/2013   LDLCALC 39 11/07/2013   TSH 1.190 05/23/2015   PSA 0.3 03/31/2012   HGBA1C 12.3* 05/23/2015    CMP     Component Value Date/Time   NA 138 06/06/2015 1600   NA 131* 08/10/2014 0136   NA 136 01/02/2014 0439   K 4.3 06/06/2015 1600   K 4.2 01/02/2014 0439   CL 92* 06/06/2015 1600   CL 101 01/02/2014 0439   CO2 30* 06/06/2015 1600   CO2 27 01/02/2014 0439   GLUCOSE 367* 06/06/2015 1600   GLUCOSE 465* 08/10/2014 0136   GLUCOSE 152* 01/02/2014 0439   BUN 27 06/06/2015 1600   BUN 29* 08/10/2014 0136   BUN 62* 01/02/2014 0439   CREATININE 1.31* 06/06/2015 1600   CREATININE 1.2 05/21/2014   CREATININE 1.96* 01/02/2014 0439   CALCIUM 8.9 06/06/2015 1600   CALCIUM 7.9* 01/02/2014 0439   PROT 6.7 05/23/2015 1650   PROT 6.6 12/30/2013 1800   ALBUMIN 3.7 06/06/2015 1600   ALBUMIN 2.7* 12/30/2013 1800   AST 31 05/23/2015 1650   AST 28 12/30/2013 1800   ALT 29 05/23/2015 1650   ALT 29 12/30/2013 1800   ALKPHOS 196* 05/23/2015 1650   ALKPHOS 102 12/30/2013 1800   BILITOT 0.5 05/23/2015 1650   BILITOT 0.5 12/30/2013 1800   GFRNONAA  56* 06/06/2015 1600   GFRNONAA 36* 01/02/2014 0439   GFRNONAA 59* 06/07/2012 0527   GFRAA 64 06/06/2015 1600   GFRAA 44* 01/02/2014 0439   GFRAA >60 06/07/2012 0527    Assessment and Plan :  1. Dizziness Worsening. Start Phenergan for nausea. Follow. - promethazine (PHENERGAN) 25 MG tablet; Take 1 tablet (25 mg total) by mouth every 4 (four) hours as needed for nausea or vomiting.  Dispense: 100 tablet; Refill: 2 Neurologic exam grossly nonfocal. Could be orthostasis. Stressed the need to push fluids. 2. Anemia, unspecified anemia type  3. Shortness of breath And changes on CT present refer back to Dr. Welton Flakes. - Ambulatory referral to Pulmonology More than 50% of this visit is spent in counseling and reviewing patient's chart. 4. Adiposity  5. DM (diabetes mellitus), secondary (HCC)  6. Essential hypertension Hypotensive today, Will stop Imdur and Torsemide. Follow up 1 week.  7. Abnormal CT scan, lung - Ambulatory referral to Pulmonology Pulmonary nodules. Follow-up CT 3-6 months recommended. Referred pulmonary as patient is short of breath. Patient was seen and examined by Dr. Bosie Clos and note was scribed by Samara Deist, RMA.   Julieanne Manson Jay [name] [facility] 07/16/2015 3:04 PM

## 2015-07-18 DIAGNOSIS — J449 Chronic obstructive pulmonary disease, unspecified: Secondary | ICD-10-CM | POA: Diagnosis not present

## 2015-07-18 DIAGNOSIS — R911 Solitary pulmonary nodule: Secondary | ICD-10-CM | POA: Diagnosis not present

## 2015-07-18 DIAGNOSIS — R0602 Shortness of breath: Secondary | ICD-10-CM | POA: Diagnosis not present

## 2015-07-21 ENCOUNTER — Emergency Department: Payer: PPO

## 2015-07-21 ENCOUNTER — Other Ambulatory Visit: Payer: Self-pay

## 2015-07-21 ENCOUNTER — Emergency Department
Admission: EM | Admit: 2015-07-21 | Discharge: 2015-07-21 | Payer: PPO | Attending: Emergency Medicine | Admitting: Emergency Medicine

## 2015-07-21 DIAGNOSIS — E1165 Type 2 diabetes mellitus with hyperglycemia: Secondary | ICD-10-CM | POA: Diagnosis not present

## 2015-07-21 DIAGNOSIS — I129 Hypertensive chronic kidney disease with stage 1 through stage 4 chronic kidney disease, or unspecified chronic kidney disease: Secondary | ICD-10-CM | POA: Diagnosis not present

## 2015-07-21 DIAGNOSIS — Z4659 Encounter for fitting and adjustment of other gastrointestinal appliance and device: Secondary | ICD-10-CM

## 2015-07-21 DIAGNOSIS — Z79899 Other long term (current) drug therapy: Secondary | ICD-10-CM | POA: Insufficient documentation

## 2015-07-21 DIAGNOSIS — Z4682 Encounter for fitting and adjustment of non-vascular catheter: Secondary | ICD-10-CM | POA: Diagnosis not present

## 2015-07-21 DIAGNOSIS — E11319 Type 2 diabetes mellitus with unspecified diabetic retinopathy without macular edema: Secondary | ICD-10-CM | POA: Insufficient documentation

## 2015-07-21 DIAGNOSIS — J811 Chronic pulmonary edema: Secondary | ICD-10-CM | POA: Diagnosis not present

## 2015-07-21 DIAGNOSIS — J9601 Acute respiratory failure with hypoxia: Secondary | ICD-10-CM | POA: Insufficient documentation

## 2015-07-21 DIAGNOSIS — N184 Chronic kidney disease, stage 4 (severe): Secondary | ICD-10-CM | POA: Diagnosis not present

## 2015-07-21 DIAGNOSIS — Z7982 Long term (current) use of aspirin: Secondary | ICD-10-CM | POA: Diagnosis not present

## 2015-07-21 DIAGNOSIS — Z7984 Long term (current) use of oral hypoglycemic drugs: Secondary | ICD-10-CM | POA: Diagnosis not present

## 2015-07-21 DIAGNOSIS — R4182 Altered mental status, unspecified: Secondary | ICD-10-CM | POA: Diagnosis not present

## 2015-07-21 DIAGNOSIS — I480 Paroxysmal atrial fibrillation: Secondary | ICD-10-CM | POA: Insufficient documentation

## 2015-07-21 DIAGNOSIS — E785 Hyperlipidemia, unspecified: Secondary | ICD-10-CM | POA: Diagnosis not present

## 2015-07-21 DIAGNOSIS — Z955 Presence of coronary angioplasty implant and graft: Secondary | ICD-10-CM | POA: Insufficient documentation

## 2015-07-21 DIAGNOSIS — I639 Cerebral infarction, unspecified: Secondary | ICD-10-CM | POA: Insufficient documentation

## 2015-07-21 DIAGNOSIS — I10 Essential (primary) hypertension: Secondary | ICD-10-CM | POA: Diagnosis not present

## 2015-07-21 DIAGNOSIS — I25110 Atherosclerotic heart disease of native coronary artery with unstable angina pectoris: Secondary | ICD-10-CM | POA: Insufficient documentation

## 2015-07-21 DIAGNOSIS — F330 Major depressive disorder, recurrent, mild: Secondary | ICD-10-CM | POA: Insufficient documentation

## 2015-07-21 DIAGNOSIS — R7309 Other abnormal glucose: Secondary | ICD-10-CM | POA: Diagnosis not present

## 2015-07-21 DIAGNOSIS — R739 Hyperglycemia, unspecified: Secondary | ICD-10-CM

## 2015-07-21 LAB — CBC WITH DIFFERENTIAL/PLATELET
BASOS PCT: 0 %
Basophils Absolute: 0 10*3/uL (ref 0–0.1)
EOS ABS: 0.2 10*3/uL (ref 0–0.7)
Eosinophils Relative: 2 %
HEMATOCRIT: 41.1 % (ref 40.0–52.0)
HEMOGLOBIN: 13.6 g/dL (ref 13.0–18.0)
LYMPHS ABS: 1.2 10*3/uL (ref 1.0–3.6)
Lymphocytes Relative: 14 %
MCH: 28.7 pg (ref 26.0–34.0)
MCHC: 33 g/dL (ref 32.0–36.0)
MCV: 86.8 fL (ref 80.0–100.0)
Monocytes Absolute: 1.1 10*3/uL — ABNORMAL HIGH (ref 0.2–1.0)
Monocytes Relative: 13 %
NEUTROS ABS: 6 10*3/uL (ref 1.4–6.5)
NEUTROS PCT: 71 %
Platelets: 155 10*3/uL (ref 150–440)
RBC: 4.74 MIL/uL (ref 4.40–5.90)
RDW: 21.6 % — ABNORMAL HIGH (ref 11.5–14.5)
WBC: 8.4 10*3/uL (ref 3.8–10.6)

## 2015-07-21 LAB — BLOOD GAS, ARTERIAL
Acid-base deficit: 0.4 mmol/L (ref 0.0–2.0)
Bicarbonate: 27.4 mEq/L (ref 21.0–28.0)
FIO2: 1
MECHVT: 500 mL
Mechanical Rate: 16
O2 SAT: 100 %
PATIENT TEMPERATURE: 37
PCO2 ART: 57 mmHg — AB (ref 32.0–48.0)
PEEP: 5 cmH2O
PO2 ART: 433 mmHg — AB (ref 83.0–108.0)
pH, Arterial: 7.29 — ABNORMAL LOW (ref 7.350–7.450)

## 2015-07-21 LAB — BLOOD GAS, VENOUS
ACID-BASE EXCESS: 3.9 mmol/L — AB (ref 0.0–3.0)
Bicarbonate: 30.2 mEq/L — ABNORMAL HIGH (ref 21.0–28.0)
O2 SAT: 80.7 %
PH VEN: 7.38 (ref 7.320–7.430)
Patient temperature: 37
pCO2, Ven: 51 mmHg (ref 44.0–60.0)
pO2, Ven: 46 mmHg — ABNORMAL HIGH (ref 31.0–45.0)

## 2015-07-21 LAB — URINALYSIS COMPLETE WITH MICROSCOPIC (ARMC ONLY)
BACTERIA UA: NONE SEEN
BILIRUBIN URINE: NEGATIVE
Glucose, UA: >500 mg/dL — AB
HGB URINE DIPSTICK: NEGATIVE
KETONES UR: NEGATIVE mg/dL
LEUKOCYTES UA: NEGATIVE
Nitrite: NEGATIVE
PH: 6 (ref 5.0–8.0)
PROTEIN: NEGATIVE mg/dL
Specific Gravity, Urine: 1.018 (ref 1.005–1.030)
Squamous Epithelial / LPF: NONE SEEN

## 2015-07-21 LAB — COMPREHENSIVE METABOLIC PANEL
ALK PHOS: 259 U/L — AB (ref 38–126)
ALT: 88 U/L — AB (ref 17–63)
AST: 103 U/L — ABNORMAL HIGH (ref 15–41)
Albumin: 3.5 g/dL (ref 3.5–5.0)
Anion gap: 12 (ref 5–15)
BUN: 30 mg/dL — ABNORMAL HIGH (ref 6–20)
CALCIUM: 8.9 mg/dL (ref 8.9–10.3)
CO2: 26 mmol/L (ref 22–32)
CREATININE: 1.62 mg/dL — AB (ref 0.61–1.24)
Chloride: 92 mmol/L — ABNORMAL LOW (ref 101–111)
GFR calc Af Amer: 49 mL/min — ABNORMAL LOW (ref >60)
GFR calc non Af Amer: 42 mL/min — ABNORMAL LOW (ref >60)
GLUCOSE: 486 mg/dL — AB (ref 65–99)
Potassium: 3.9 mmol/L (ref 3.5–5.1)
SODIUM: 130 mmol/L — AB (ref 135–145)
Total Bilirubin: 1.1 mg/dL (ref 0.3–1.2)
Total Protein: 7.2 g/dL (ref 6.5–8.1)

## 2015-07-21 LAB — GLUCOSE, CAPILLARY: Glucose-Capillary: 479 mg/dL — ABNORMAL HIGH (ref 65–99)

## 2015-07-21 LAB — AMMONIA: Ammonia: 65 umol/L — ABNORMAL HIGH (ref 9–35)

## 2015-07-21 LAB — BETA-HYDROXYBUTYRIC ACID: Beta-Hydroxybutyric Acid: 0.54 mmol/L — ABNORMAL HIGH (ref 0.05–0.27)

## 2015-07-21 LAB — TROPONIN I: Troponin I: 0.03 ng/mL (ref <0.031)

## 2015-07-21 MED ORDER — LABETALOL HCL 5 MG/ML IV SOLN
INTRAVENOUS | Status: AC
  Administered 2015-07-21: 10 mg via INTRAVENOUS
  Filled 2015-07-21: qty 4

## 2015-07-21 MED ORDER — MIDAZOLAM HCL 2 MG/2ML IJ SOLN
2.0000 mg | Freq: Once | INTRAMUSCULAR | Status: AC
  Administered 2015-07-21: 2 mg via INTRAVENOUS

## 2015-07-21 MED ORDER — ALTEPLASE 100 MG IV SOLR
INTRAVENOUS | Status: AC
  Administered 2015-07-21: 90 mg via INTRAVENOUS
  Filled 2015-07-21: qty 100

## 2015-07-21 MED ORDER — ALTEPLASE (STROKE) FULL DOSE INFUSION
90.0000 mg | Freq: Once | INTRAVENOUS | Status: AC
Start: 2015-07-21 — End: 2015-07-21
  Administered 2015-07-21: 90 mg via INTRAVENOUS
  Filled 2015-07-21: qty 100

## 2015-07-21 MED ORDER — VECURONIUM BROMIDE 10 MG IV SOLR
10.0000 mg | Freq: Once | INTRAVENOUS | Status: AC
  Administered 2015-07-21: 10 mg via INTRAVENOUS

## 2015-07-21 MED ORDER — SODIUM CHLORIDE 0.9 % IV SOLN
50.0000 mL | Freq: Once | INTRAVENOUS | Status: AC
Start: 2015-07-21 — End: 2015-07-21
  Administered 2015-07-21: 50 mL via INTRAVENOUS

## 2015-07-21 MED ORDER — SODIUM CHLORIDE 0.9 % IV SOLN
Freq: Once | INTRAVENOUS | Status: AC
  Administered 2015-07-21: 20:00:00 via INTRAVENOUS

## 2015-07-21 MED ORDER — PROPOFOL 1000 MG/100ML IV EMUL
INTRAVENOUS | Status: AC
  Administered 2015-07-21: 10 ug/kg/min via INTRAVENOUS
  Filled 2015-07-21: qty 100

## 2015-07-21 MED ORDER — MIDAZOLAM HCL 2 MG/2ML IJ SOLN: 2.0000 mg | Freq: Once | INTRAMUSCULAR | Status: DC

## 2015-07-21 MED ORDER — MIDAZOLAM HCL 2 MG/2ML IJ SOLN
4.0000 mg | Freq: Once | INTRAMUSCULAR | Status: AC
  Administered 2015-07-21: 4 mg via INTRAVENOUS

## 2015-07-21 MED ORDER — LABETALOL HCL 5 MG/ML IV SOLN
10.0000 mg | Freq: Once | INTRAVENOUS | Status: AC
  Administered 2015-07-21: 10 mg via INTRAVENOUS

## 2015-07-21 MED ORDER — PROPOFOL 1000 MG/100ML IV EMUL
5.0000 ug/kg/min | Freq: Once | INTRAVENOUS | Status: AC
  Administered 2015-07-21: 10 ug/kg/min via INTRAVENOUS

## 2015-07-21 NOTE — ED Notes (Signed)
Pt to ED from home c/o altered mental status. Per EMS family stated the pt was not behaving as himself, EMS reported at the scene patient had pinpoint pupils, CBG read high with ketones, so EMS administered 500 ml NS. Pt alert not oriented Xs 4, in no acute distress at this time.

## 2015-07-21 NOTE — ED Provider Notes (Signed)
IMPRESSION: Perihilar opacification likely interstitial edema and less likely infection. Mild stable cardiomegaly.  Endotracheal tube with tip 2.8 cm above the carina.   Patient was noted to have some frank blood coming from an OG tube. He has received a dose of TPA which may have caused the bleeding. He remains stable at this time.  Emily Filbert, MD 07/21/15 2240

## 2015-07-21 NOTE — ED Provider Notes (Addendum)
[facility] Emergency Department Provider Note        Time seen: ----------------------------------------- 7:17 PM on 07/21/2015 -----------------------------------------  L5 caveat: Review of systems and history is unknown at this time.  I have reviewed the triage vital signs and the nursing notes.   HISTORY  Chief Complaint No chief complaint on file.    HPI Jay [name] is a 70 y.o. male who presents to the ER for altered mental status. Family had called the ER due to altered mental status and he was found to have an elevated blood sugar level. Fingerstick blood sugar read over 600. Patient arrives confused and disoriented, unable to give review of systems or report. Family later reports he had acute onset of the symptoms around 6 PM where he became confused. He remains confused at this time.  Past Medical History  Diagnosis Date  . Diabetes mellitus without complication (HCC)   . Hypertension   . Anemia   . Angina pectoris (HCC)   . CAD (coronary artery disease)   . Foot deformities, congenital   . Fatty liver   . GERD (gastroesophageal reflux disease)   . Bilateral diabetic retinopathy (HCC)   . Morbid obesity (HCC)   . Pedal edema   . Sinoatrial node dysfunction (HCC)   . Sleep apnea     not tolerating CPAP    Patient Active Problem List   Diagnosis Date Noted  . Unstable angina (HCC) 06/24/2015  . Diabetes mellitus (HCC) 08/20/2014  . Wound infection (HCC) 08/17/2014  . Acute kidney failure (HCC) 05/23/2014  . Actinic keratosis 05/23/2014  . Absolute anemia 05/23/2014  . CAD in native artery 05/23/2014  . Depression, major, recurrent (HCC) 05/23/2014  . DM (diabetes mellitus), secondary (HCC) 05/23/2014  . Diabetic retinopathy (HCC) 05/23/2014  . Abnormal LFTs 05/23/2014  . Fatty infiltration of liver 05/23/2014  . Acid reflux 05/23/2014  . BP (high blood pressure) 05/23/2014  . HLD (hyperlipidemia) 05/23/2014  . Anemia,  iron deficiency 05/23/2014  . Metal bone fixation hardware in place 05/23/2014  . Adiposity 05/23/2014  . Obstructive apnea 05/23/2014  . Plaque psoriasis 05/23/2014  . 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 AF (paroxysmal atrial fibrillation) (HCC) 03/01/2014  . Aortic heart valve narrowing 01/05/2014  . Chronic kidney disease (CKD), stage IV (severe) (HCC) 01/05/2014  . Benign essential HTN 01/05/2014    Past Surgical History  Procedure Laterality Date  . Coronary angioplasty with stent placement  2006 and 2008  . Cardiac catheterization Left 07/10/2015    Procedure: Coronary Angiography;  Surgeon: Lamar Blinks, MD;  Location: [facility] INVASIVE CV LAB;  Service: Cardiovascular;  Laterality: Left;    Allergies Metformin  Social History Social History  Substance Use Topics  . Smoking status: Never Smoker   . Smokeless tobacco: Never Used  . Alcohol Use: No    Review of Systems Unknown at this time  ____________________________________________   PHYSICAL EXAM:  VITAL SIGNS: ED Triage Vitals  Enc Vitals Group     BP --      Pulse --      Resp --      Temp --      Temp src --      SpO2 --      Weight --      Height --      Head Cir --      Peak Flow --      Pain Score --      Pain  Loc --      Pain Edu? --      Excl. in GC? --    Constitutional: Alert But disoriented, does not follow commands.  Eyes: Conjunctivae are mildly injected, PERRL. right gaze preference ENT   Head: Normocephalic and atraumatic.   Nose: No congestion/rhinnorhea.   Mouth/Throat: Mucous membranes are moist.   Neck: No stridor. Cardiovascular: Rapid rate, regular rhythm. No murmurs, rubs, or gallops. Respiratory: Normal respiratory effort without tachypnea nor retractions. Breath sounds are clear and equal bilaterally. No wheezes/rales/rhonchi. Gastrointestinal: Soft and nontender. Normal bowel sounds Musculoskeletal: Nontender with normal range of motion in all extremities. No  lower extremity tenderness, mild edema Neurologic:  Normal speech but confusion, cannot cooperate with examination. GCS--E4,V3,M4=11 Skin:  Skin is warm, dry and intact. No rash noted. Psychiatric: Speech is normal but nonsensical, behavior is abnormal, patient cannot follow commands. ____________________________________________  EKG: Interpreted by me. Sinus rhythm with a rate of 61 bpm, prolonged. Normal, normal QRS, normal QT interval. Normal axis. T-wave inversions and ST depression is noted.  ____________________________________________  ED COURSE:  Pertinent labs & imaging results that were available during my care of the patient were reviewed by me and considered in my medical decision making (see chart for details). Patient presents to ER with altered mental status, we'll check basic labs, CT imaging and reevaluate.  Patient subsequently deteriorated, had a persistent right-sided gaze deficit and became less responsive. He also became hypoxic. He possibly aspirated. RSI was utilized to protect his airway. I do think TPA would be in his best interest. This is been agreed upon in conjunction with the tele-neurologist.  INTUBATION Performed by: Daryel November E  Required items: required blood products, implants, devices, and special equipment available Patient identity confirmed: provided demographic data and hospital-assigned identification number Time out: Immediately prior to procedure a "time out" was called to verify the correct patient, procedure, equipment, support staff and site/side marked as required.  Indications: Airway protection   Intubation method: Glidescope Laryngoscopy   Preoxygenation: 100% BVM  Sedatives: 20 mg Etomidate Paralytic: 100 mg Succinylcholine  Tube Size: 8.0 cuffed  Post-procedure assessment: chest rise and ETCO2 monitor Breath sounds: equal and absent over the epigastrium Tube secured with: ETT holder Chest x-ray interpreted by  radiologist and me.  Chest x-ray findings: endotracheal tube in appropriate position  Patient tolerated the procedure well with no immediate complications.    ____________________________________________    LABS (pertinent positives/negatives)  Labs Reviewed  CBC WITH DIFFERENTIAL/PLATELET - Abnormal; Notable for the following:    RDW 21.6 (*)    Monocytes Absolute 1.1 (*)    All other components within normal limits  COMPREHENSIVE METABOLIC PANEL - Abnormal; Notable for the following:    Sodium 130 (*)    Chloride 92 (*)    Glucose, Bld 486 (*)    BUN 30 (*)    Creatinine, Ser 1.62 (*)    AST 103 (*)    ALT 88 (*)    Alkaline Phosphatase 259 (*)    GFR calc non Af Amer 42 (*)    GFR calc Af Amer 49 (*)    All other components within normal limits  AMMONIA - Abnormal; Notable for the following:    Ammonia 65 (*)    All other components within normal limits  URINALYSIS COMPLETEWITH MICROSCOPIC ([facility] ONLY) - Abnormal; Notable for the following:    Color, Urine STRAW (*)    APPearance CLEAR (*)    Glucose, UA >500 (*)  All other components within normal limits  BETA-HYDROXYBUTYRIC ACID - Abnormal; Notable for the following:    Beta-Hydroxybutyric Acid 0.54 (*)    All other components within normal limits  BLOOD GAS, VENOUS - Abnormal; Notable for the following:    pO2, Ven 46.0 (*)    Bicarbonate 30.2 (*)    Acid-Base Excess 3.9 (*)    All other components within normal limits  GLUCOSE, CAPILLARY - Abnormal; Notable for the following:    Glucose-Capillary 479 (*)    All other components within normal limits  TROPONIN I  CBG MONITORING, ED   CRITICAL CARE Performed by: Emily Filbert   Total critical care time: 30 minutes  Critical care time was exclusive of separately billable procedures and treating other patients.  Critical care was necessary to treat or prevent imminent or life-threatening deterioration.  Critical care was time spent personally  by me on the following activities: development of treatment plan with patient and/or surrogate as well as nursing, discussions with consultants, evaluation of patient's response to treatment, examination of patient, obtaining history from patient or surrogate, ordering and performing treatments and interventions, ordering and review of laboratory studies, ordering and review of radiographic studies, pulse oximetry and re-evaluation of patient's condition.  RADIOLOGY Images were viewed by me  CT head IMPRESSION: No acute abnormality.  Atrophy. 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 Patient with concerning signs and symptoms of acute CVA. Initially this was thought to be encephalopathy due to hyperglycemia or other etiology. However this appears to mostly be related to CVA. I have discussed with the Tele-neurologist who is recommending giving TPA. He is intubated, has been accepted in transfer to the ICU at [facility]. He is on propofol for sedation and blood pressure control. TPA is currently being administered.  Emily Filbert, MD   Note: This dictation was prepared with Dragon dictation. Any transcriptional errors that result from this process are unintentional   Emily Filbert, MD 07/21/15 [id number]  Emily Filbert, MD 07/21/15 2239

## 2015-07-21 NOTE — ED Notes (Signed)
Pt alert not oriented during stroke assessment, when all of a sudden pt began deteriorating at 2055; pt pale, gazing to the right side, and began sating at 87% on 2Ls placed on non-rebreather sating at 92% and intubation procedures commenced.   Pt administered 20 mg of etomidate followed by 100 mg of succinylcholine at 2057 and successfully intubated at 2058.

## 2015-07-22 ENCOUNTER — Encounter (HOSPITAL_COMMUNITY): Payer: Self-pay | Admitting: *Deleted

## 2015-07-22 ENCOUNTER — Ambulatory Visit: Payer: PPO | Admitting: Family Medicine

## 2015-07-22 ENCOUNTER — Inpatient Hospital Stay (HOSPITAL_COMMUNITY): Payer: PPO

## 2015-07-22 ENCOUNTER — Other Ambulatory Visit (HOSPITAL_COMMUNITY): Payer: PPO

## 2015-07-22 ENCOUNTER — Inpatient Hospital Stay (HOSPITAL_COMMUNITY)
Admission: AD | Admit: 2015-07-22 | Discharge: 2015-07-27 | DRG: 100 | Disposition: A | Discharge status: Skilled Nursing Facility | Payer: PPO | Source: Other Acute Inpatient Hospital | Attending: Internal Medicine | Admitting: Internal Medicine

## 2015-07-22 DIAGNOSIS — E1159 Type 2 diabetes mellitus with other circulatory complications: Secondary | ICD-10-CM | POA: Diagnosis not present

## 2015-07-22 DIAGNOSIS — K219 Gastro-esophageal reflux disease without esophagitis: Secondary | ICD-10-CM | POA: Diagnosis not present

## 2015-07-22 DIAGNOSIS — E662 Morbid (severe) obesity with alveolar hypoventilation: Secondary | ICD-10-CM | POA: Diagnosis not present

## 2015-07-22 DIAGNOSIS — I2510 Atherosclerotic heart disease of native coronary artery without angina pectoris: Secondary | ICD-10-CM | POA: Diagnosis not present

## 2015-07-22 DIAGNOSIS — I6523 Occlusion and stenosis of bilateral carotid arteries: Secondary | ICD-10-CM | POA: Diagnosis not present

## 2015-07-22 DIAGNOSIS — I959 Hypotension, unspecified: Secondary | ICD-10-CM | POA: Diagnosis present

## 2015-07-22 DIAGNOSIS — N184 Chronic kidney disease, stage 4 (severe): Secondary | ICD-10-CM

## 2015-07-22 DIAGNOSIS — E871 Hypo-osmolality and hyponatremia: Secondary | ICD-10-CM | POA: Diagnosis not present

## 2015-07-22 DIAGNOSIS — R740 Nonspecific elevation of levels of transaminase and lactic acid dehydrogenase [LDH]: Secondary | ICD-10-CM | POA: Diagnosis not present

## 2015-07-22 DIAGNOSIS — E1122 Type 2 diabetes mellitus with diabetic chronic kidney disease: Secondary | ICD-10-CM | POA: Diagnosis present

## 2015-07-22 DIAGNOSIS — L400 Psoriasis vulgaris: Secondary | ICD-10-CM

## 2015-07-22 DIAGNOSIS — I129 Hypertensive chronic kidney disease with stage 1 through stage 4 chronic kidney disease, or unspecified chronic kidney disease: Secondary | ICD-10-CM | POA: Diagnosis present

## 2015-07-22 DIAGNOSIS — D509 Iron deficiency anemia, unspecified: Secondary | ICD-10-CM

## 2015-07-22 DIAGNOSIS — E7220 Disorder of urea cycle metabolism, unspecified: Secondary | ICD-10-CM | POA: Diagnosis not present

## 2015-07-22 DIAGNOSIS — E785 Hyperlipidemia, unspecified: Secondary | ICD-10-CM | POA: Diagnosis not present

## 2015-07-22 DIAGNOSIS — L570 Actinic keratosis: Secondary | ICD-10-CM

## 2015-07-22 DIAGNOSIS — J9601 Acute respiratory failure with hypoxia: Secondary | ICD-10-CM | POA: Diagnosis not present

## 2015-07-22 DIAGNOSIS — R739 Hyperglycemia, unspecified: Secondary | ICD-10-CM | POA: Diagnosis not present

## 2015-07-22 DIAGNOSIS — Z8673 Personal history of transient ischemic attack (TIA), and cerebral infarction without residual deficits: Secondary | ICD-10-CM

## 2015-07-22 DIAGNOSIS — N183 Chronic kidney disease, stage 3 (moderate): Secondary | ICD-10-CM | POA: Diagnosis not present

## 2015-07-22 DIAGNOSIS — I200 Unstable angina: Secondary | ICD-10-CM

## 2015-07-22 DIAGNOSIS — I350 Nonrheumatic aortic (valve) stenosis: Secondary | ICD-10-CM

## 2015-07-22 DIAGNOSIS — N179 Acute kidney failure, unspecified: Secondary | ICD-10-CM | POA: Diagnosis not present

## 2015-07-22 DIAGNOSIS — K625 Hemorrhage of anus and rectum: Secondary | ICD-10-CM | POA: Diagnosis not present

## 2015-07-22 DIAGNOSIS — I639 Cerebral infarction, unspecified: Secondary | ICD-10-CM

## 2015-07-22 DIAGNOSIS — J9620 Acute and chronic respiratory failure, unspecified whether with hypoxia or hypercapnia: Secondary | ICD-10-CM | POA: Diagnosis not present

## 2015-07-22 DIAGNOSIS — Z6838 Body mass index (BMI) 38.0-38.9, adult: Secondary | ICD-10-CM | POA: Diagnosis not present

## 2015-07-22 DIAGNOSIS — E875 Hyperkalemia: Secondary | ICD-10-CM | POA: Diagnosis present

## 2015-07-22 DIAGNOSIS — E1311 Other specified diabetes mellitus with ketoacidosis with coma: Secondary | ICD-10-CM | POA: Diagnosis not present

## 2015-07-22 DIAGNOSIS — K760 Fatty (change of) liver, not elsewhere classified: Secondary | ICD-10-CM | POA: Diagnosis not present

## 2015-07-22 DIAGNOSIS — K7682 Hepatic encephalopathy: Secondary | ICD-10-CM

## 2015-07-22 DIAGNOSIS — E1310 Other specified diabetes mellitus with ketoacidosis without coma: Secondary | ICD-10-CM | POA: Diagnosis not present

## 2015-07-22 DIAGNOSIS — T148XXA Other injury of unspecified body region, initial encounter: Secondary | ICD-10-CM

## 2015-07-22 DIAGNOSIS — I480 Paroxysmal atrial fibrillation: Secondary | ICD-10-CM

## 2015-07-22 DIAGNOSIS — I10 Essential (primary) hypertension: Secondary | ICD-10-CM

## 2015-07-22 DIAGNOSIS — R7989 Other specified abnormal findings of blood chemistry: Secondary | ICD-10-CM

## 2015-07-22 DIAGNOSIS — K7460 Unspecified cirrhosis of liver: Secondary | ICD-10-CM | POA: Diagnosis present

## 2015-07-22 DIAGNOSIS — R911 Solitary pulmonary nodule: Secondary | ICD-10-CM

## 2015-07-22 DIAGNOSIS — R569 Unspecified convulsions: Secondary | ICD-10-CM | POA: Diagnosis not present

## 2015-07-22 DIAGNOSIS — J9602 Acute respiratory failure with hypercapnia: Secondary | ICD-10-CM | POA: Diagnosis present

## 2015-07-22 DIAGNOSIS — Z967 Presence of other bone and tendon implants: Secondary | ICD-10-CM

## 2015-07-22 DIAGNOSIS — R401 Stupor: Secondary | ICD-10-CM | POA: Diagnosis not present

## 2015-07-22 DIAGNOSIS — E1111 Type 2 diabetes mellitus with ketoacidosis with coma: Secondary | ICD-10-CM

## 2015-07-22 DIAGNOSIS — I6789 Other cerebrovascular disease: Secondary | ICD-10-CM | POA: Diagnosis not present

## 2015-07-22 DIAGNOSIS — K7290 Hepatic failure, unspecified without coma: Secondary | ICD-10-CM

## 2015-07-22 DIAGNOSIS — K7581 Nonalcoholic steatohepatitis (NASH): Secondary | ICD-10-CM | POA: Diagnosis not present

## 2015-07-22 DIAGNOSIS — E1165 Type 2 diabetes mellitus with hyperglycemia: Secondary | ICD-10-CM | POA: Diagnosis not present

## 2015-07-22 DIAGNOSIS — Z955 Presence of coronary angioplasty implant and graft: Secondary | ICD-10-CM | POA: Diagnosis not present

## 2015-07-22 DIAGNOSIS — R4182 Altered mental status, unspecified: Secondary | ICD-10-CM | POA: Diagnosis not present

## 2015-07-22 DIAGNOSIS — Z9282 Status post administration of tPA (rtPA) in a different facility within the last 24 hours prior to admission to current facility: Secondary | ICD-10-CM | POA: Diagnosis not present

## 2015-07-22 DIAGNOSIS — J9811 Atelectasis: Secondary | ICD-10-CM | POA: Diagnosis not present

## 2015-07-22 DIAGNOSIS — Z7984 Long term (current) use of oral hypoglycemic drugs: Secondary | ICD-10-CM

## 2015-07-22 DIAGNOSIS — Z7982 Long term (current) use of aspirin: Secondary | ICD-10-CM

## 2015-07-22 DIAGNOSIS — G9340 Encephalopathy, unspecified: Secondary | ICD-10-CM

## 2015-07-22 DIAGNOSIS — K7200 Acute and subacute hepatic failure without coma: Secondary | ICD-10-CM | POA: Diagnosis present

## 2015-07-22 DIAGNOSIS — L089 Local infection of the skin and subcutaneous tissue, unspecified: Secondary | ICD-10-CM

## 2015-07-22 DIAGNOSIS — J9690 Respiratory failure, unspecified, unspecified whether with hypoxia or hypercapnia: Secondary | ICD-10-CM

## 2015-07-22 DIAGNOSIS — E11319 Type 2 diabetes mellitus with unspecified diabetic retinopathy without macular edema: Secondary | ICD-10-CM | POA: Diagnosis present

## 2015-07-22 DIAGNOSIS — R945 Abnormal results of liver function studies: Secondary | ICD-10-CM

## 2015-07-22 DIAGNOSIS — J9600 Acute respiratory failure, unspecified whether with hypoxia or hypercapnia: Secondary | ICD-10-CM

## 2015-07-22 DIAGNOSIS — E139 Other specified diabetes mellitus without complications: Secondary | ICD-10-CM

## 2015-07-22 DIAGNOSIS — G4733 Obstructive sleep apnea (adult) (pediatric): Secondary | ICD-10-CM

## 2015-07-22 DIAGNOSIS — E669 Obesity, unspecified: Secondary | ICD-10-CM

## 2015-07-22 DIAGNOSIS — K922 Gastrointestinal hemorrhage, unspecified: Secondary | ICD-10-CM | POA: Diagnosis present

## 2015-07-22 DIAGNOSIS — Z794 Long term (current) use of insulin: Secondary | ICD-10-CM

## 2015-07-22 LAB — CBC WITH DIFFERENTIAL/PLATELET
BASOS PCT: 0 %
Basophils Absolute: 0 10*3/uL (ref 0.0–0.1)
EOS ABS: 0.1 10*3/uL (ref 0.0–0.7)
Eosinophils Relative: 0 %
HCT: 35.1 % — ABNORMAL LOW (ref 39.0–52.0)
HEMOGLOBIN: 11.2 g/dL — AB (ref 13.0–17.0)
LYMPHS ABS: 1.3 10*3/uL (ref 0.7–4.0)
Lymphocytes Relative: 9 %
MCH: 27.7 pg (ref 26.0–34.0)
MCHC: 31.9 g/dL (ref 30.0–36.0)
MCV: 86.7 fL (ref 78.0–100.0)
Monocytes Absolute: 1.3 10*3/uL — ABNORMAL HIGH (ref 0.1–1.0)
Monocytes Relative: 8 %
NEUTROS PCT: 82 %
Neutro Abs: 12.5 10*3/uL — ABNORMAL HIGH (ref 1.7–7.7)
Platelets: 175 10*3/uL (ref 150–400)
RBC: 4.05 MIL/uL — AB (ref 4.22–5.81)
RDW: 19.2 % — ABNORMAL HIGH (ref 11.5–15.5)
WBC: 15.2 10*3/uL — AB (ref 4.0–10.5)

## 2015-07-22 LAB — CBC
HEMATOCRIT: 36 % — AB (ref 39.0–52.0)
HEMATOCRIT: 33.9 % — AB (ref 39.0–52.0)
HEMATOCRIT: 33.2 % — AB (ref 39.0–52.0)
HEMATOCRIT: 34.8 % — AB (ref 39.0–52.0)
HEMOGLOBIN: 11.8 g/dL — AB (ref 13.0–17.0)
HEMOGLOBIN: 10.7 g/dL — AB (ref 13.0–17.0)
Hemoglobin: 10.6 g/dL — ABNORMAL LOW (ref 13.0–17.0)
Hemoglobin: 11.2 g/dL — ABNORMAL LOW (ref 13.0–17.0)
MCH: 27.7 pg (ref 26.0–34.0)
MCH: 27.1 pg (ref 26.0–34.0)
MCH: 27.2 pg (ref 26.0–34.0)
MCH: 27.9 pg (ref 26.0–34.0)
MCHC: 32.8 g/dL (ref 30.0–36.0)
MCHC: 31.6 g/dL (ref 30.0–36.0)
MCHC: 31.9 g/dL (ref 30.0–36.0)
MCHC: 32.2 g/dL (ref 30.0–36.0)
MCV: 84.5 fL (ref 78.0–100.0)
MCV: 85.8 fL (ref 78.0–100.0)
MCV: 85.1 fL (ref 78.0–100.0)
MCV: 86.6 fL (ref 78.0–100.0)
PLATELETS: 157 10*3/uL (ref 150–400)
Platelets: 165 10*3/uL (ref 150–400)
Platelets: 143 10*3/uL — ABNORMAL LOW (ref 150–400)
Platelets: 154 10*3/uL (ref 150–400)
RBC: 4.26 MIL/uL (ref 4.22–5.81)
RBC: 3.95 MIL/uL — ABNORMAL LOW (ref 4.22–5.81)
RBC: 3.9 MIL/uL — AB (ref 4.22–5.81)
RBC: 4.02 MIL/uL — ABNORMAL LOW (ref 4.22–5.81)
RDW: 19.3 % — ABNORMAL HIGH (ref 11.5–15.5)
RDW: 19.4 % — ABNORMAL HIGH (ref 11.5–15.5)
RDW: 19.5 % — AB (ref 11.5–15.5)
RDW: 19.8 % — AB (ref 11.5–15.5)
WBC: 14.6 10*3/uL — ABNORMAL HIGH (ref 4.0–10.5)
WBC: 13.9 10*3/uL — ABNORMAL HIGH (ref 4.0–10.5)
WBC: 15 10*3/uL — ABNORMAL HIGH (ref 4.0–10.5)
WBC: 21 10*3/uL — ABNORMAL HIGH (ref 4.0–10.5)

## 2015-07-22 LAB — COMPREHENSIVE METABOLIC PANEL
ALBUMIN: 2.8 g/dL — AB (ref 3.5–5.0)
ALK PHOS: 199 U/L — AB (ref 38–126)
ALT: 75 U/L — AB (ref 17–63)
AST: 71 U/L — AB (ref 15–41)
Anion gap: 11 (ref 5–15)
BUN: 35 mg/dL — ABNORMAL HIGH (ref 6–20)
CALCIUM: 8.3 mg/dL — AB (ref 8.9–10.3)
CO2: 24 mmol/L (ref 22–32)
CREATININE: 1.68 mg/dL — AB (ref 0.61–1.24)
Chloride: 97 mmol/L — ABNORMAL LOW (ref 101–111)
GFR calc Af Amer: 47 mL/min — ABNORMAL LOW (ref >60)
GFR calc non Af Amer: 40 mL/min — ABNORMAL LOW (ref >60)
GLUCOSE: 540 mg/dL — AB (ref 65–99)
Potassium: 5.8 mmol/L — ABNORMAL HIGH (ref 3.5–5.1)
SODIUM: 132 mmol/L — AB (ref 135–145)
Total Bilirubin: 1.1 mg/dL (ref 0.3–1.2)
Total Protein: 6 g/dL — ABNORMAL LOW (ref 6.5–8.1)

## 2015-07-22 LAB — GLUCOSE, CAPILLARY
GLUCOSE-CAPILLARY: 449 mg/dL — AB (ref 65–99)
GLUCOSE-CAPILLARY: 463 mg/dL — AB (ref 65–99)
GLUCOSE-CAPILLARY: 437 mg/dL — AB (ref 65–99)
GLUCOSE-CAPILLARY: 190 mg/dL — AB (ref 65–99)
GLUCOSE-CAPILLARY: 121 mg/dL — AB (ref 65–99)
GLUCOSE-CAPILLARY: 141 mg/dL — AB (ref 65–99)
GLUCOSE-CAPILLARY: 151 mg/dL — AB (ref 65–99)
GLUCOSE-CAPILLARY: 133 mg/dL — AB (ref 65–99)
GLUCOSE-CAPILLARY: 154 mg/dL — AB (ref 65–99)
Glucose-Capillary: 403 mg/dL — ABNORMAL HIGH (ref 65–99)
Glucose-Capillary: 535 mg/dL — ABNORMAL HIGH (ref 65–99)
Glucose-Capillary: 377 mg/dL — ABNORMAL HIGH (ref 65–99)
Glucose-Capillary: 284 mg/dL — ABNORMAL HIGH (ref 65–99)
Glucose-Capillary: 257 mg/dL — ABNORMAL HIGH (ref 65–99)
Glucose-Capillary: 223 mg/dL — ABNORMAL HIGH (ref 65–99)
Glucose-Capillary: 167 mg/dL — ABNORMAL HIGH (ref 65–99)

## 2015-07-22 LAB — PROTIME-INR
INR: 1.29 (ref 0.00–1.49)
Prothrombin Time: 16.2 seconds — ABNORMAL HIGH (ref 11.6–15.2)

## 2015-07-22 LAB — BASIC METABOLIC PANEL
Anion gap: 9 (ref 5–15)
BUN: 27 mg/dL — AB (ref 6–20)
CALCIUM: 8.3 mg/dL — AB (ref 8.9–10.3)
CHLORIDE: 96 mmol/L — AB (ref 101–111)
CO2: 25 mmol/L (ref 22–32)
CREATININE: 1.54 mg/dL — AB (ref 0.61–1.24)
GFR, EST AFRICAN AMERICAN: 52 mL/min — AB (ref >60)
GFR, EST NON AFRICAN AMERICAN: 45 mL/min — AB (ref >60)
Glucose, Bld: 529 mg/dL — ABNORMAL HIGH (ref 65–99)
POTASSIUM: 6.5 mmol/L — AB (ref 3.5–5.1)
SODIUM: 130 mmol/L — AB (ref 135–145)

## 2015-07-22 LAB — TRIGLYCERIDES: Triglycerides: 121 mg/dL (ref <150)

## 2015-07-22 LAB — LIPID PANEL
CHOLESTEROL: 136 mg/dL (ref 0–200)
HDL: 53 mg/dL (ref >40)
LDL CALC: 58 mg/dL (ref 0–99)
TRIGLYCERIDES: 123 mg/dL (ref <150)
Total CHOL/HDL Ratio: 2.6 RATIO
VLDL: 25 mg/dL (ref 0–40)

## 2015-07-22 LAB — APTT: aPTT: 29 seconds (ref 24–37)

## 2015-07-22 LAB — PROCALCITONIN: PROCALCITONIN: 0.19 ng/mL

## 2015-07-22 LAB — MRSA PCR SCREENING: MRSA BY PCR: POSITIVE — AB

## 2015-07-22 MED ORDER — SODIUM CHLORIDE 0.9 % IV SOLN
80.0000 mg | Freq: Once | INTRAVENOUS | Status: AC
  Administered 2015-07-22: 80 mg via INTRAVENOUS
  Filled 2015-07-22: qty 80

## 2015-07-22 MED ORDER — SODIUM CHLORIDE 0.9 % IV SOLN
25.0000 ug/h | INTRAVENOUS | Status: DC
  Administered 2015-07-22: 50 ug/h via INTRAVENOUS
  Filled 2015-07-22: qty 50

## 2015-07-22 MED ORDER — ONDANSETRON HCL 4 MG/2ML IJ SOLN: 4.0000 mg | Freq: Four times a day (QID) | INTRAMUSCULAR | Status: DC | PRN

## 2015-07-22 MED ORDER — PROPOFOL 1000 MG/100ML IV EMUL
0.0000 ug/kg/min | INTRAVENOUS | Status: DC
  Administered 2015-07-22: 20 ug/kg/min via INTRAVENOUS
  Administered 2015-07-22: 35 ug/kg/min via INTRAVENOUS
  Administered 2015-07-22: 10 ug/kg/min via INTRAVENOUS
  Filled 2015-07-22 (×2): qty 100

## 2015-07-22 MED ORDER — MUPIROCIN 2 % EX OINT
1.0000 | TOPICAL_OINTMENT | Freq: Two times a day (BID) | CUTANEOUS | Status: AC
Start: 2015-07-22 — End: 2015-07-26
  Administered 2015-07-22 – 2015-07-26 (×10): 1 via NASAL
  Filled 2015-07-22 (×2): qty 22

## 2015-07-22 MED ORDER — FENTANYL BOLUS VIA INFUSION
25.0000 ug | INTRAVENOUS | Status: DC | PRN
  Filled 2015-07-22: qty 25

## 2015-07-22 MED ORDER — INSULIN ASPART 100 UNIT/ML ~~LOC~~ SOLN
2.0000 [IU] | SUBCUTANEOUS | Status: DC
Start: 2015-07-22 — End: 2015-07-23
  Administered 2015-07-23 (×2): 4 [IU] via SUBCUTANEOUS
  Administered 2015-07-23: 6 [IU] via SUBCUTANEOUS

## 2015-07-22 MED ORDER — SODIUM CHLORIDE 0.9 % IV SOLN
INTRAVENOUS | Status: DC
  Administered 2015-07-22: 4.8 [IU]/h via INTRAVENOUS
  Filled 2015-07-22: qty 2.5

## 2015-07-22 MED ORDER — CHLORHEXIDINE GLUCONATE 0.12% ORAL RINSE (MEDLINE KIT)
15.0000 mL | Freq: Two times a day (BID) | OROMUCOSAL | Status: DC
  Administered 2015-07-22 – 2015-07-23 (×4): 15 mL via OROMUCOSAL

## 2015-07-22 MED ORDER — SODIUM CHLORIDE 0.9 % IV SOLN
INTRAVENOUS | Status: DC
  Administered 2015-07-22 – 2015-07-23 (×3): via INTRAVENOUS

## 2015-07-22 MED ORDER — CHLORHEXIDINE GLUCONATE CLOTH 2 % EX PADS
6.0000 | MEDICATED_PAD | Freq: Every day | CUTANEOUS | Status: AC
Start: 2015-07-22 — End: 2015-07-26
  Administered 2015-07-22 – 2015-07-26 (×5): 6 via TOPICAL

## 2015-07-22 MED ORDER — ROSUVASTATIN CALCIUM 10 MG PO TABS
10.0000 mg | ORAL_TABLET | Freq: Every day | ORAL | Status: DC
Start: 2015-07-22 — End: 2015-07-24
  Administered 2015-07-22 – 2015-07-23 (×2): 10 mg via ORAL
  Filled 2015-07-22 (×2): qty 1

## 2015-07-22 MED ORDER — FAMOTIDINE IN NACL 20-0.9 MG/50ML-% IV SOLN: 20.0000 mg | Freq: Two times a day (BID) | INTRAVENOUS | Status: DC

## 2015-07-22 MED ORDER — SODIUM CHLORIDE 0.9 % IV BOLUS (SEPSIS)
1000.0000 mL | Freq: Once | INTRAVENOUS | Status: AC
  Administered 2015-07-22: 1000 mL via INTRAVENOUS

## 2015-07-22 MED ORDER — SODIUM CHLORIDE 0.9 % IV SOLN
8.0000 mg/h | INTRAVENOUS | Status: DC
  Administered 2015-07-22 – 2015-07-23 (×5): 8 mg/h via INTRAVENOUS
  Filled 2015-07-22 (×12): qty 80

## 2015-07-22 MED ORDER — PHENYLEPHRINE HCL 10 MG/ML IJ SOLN
0.0000 ug/min | INTRAMUSCULAR | Status: DC
  Administered 2015-07-22: 20 ug/min via INTRAVENOUS
  Filled 2015-07-22: qty 1

## 2015-07-22 MED ORDER — PANTOPRAZOLE SODIUM 40 MG IV SOLR: 40.0000 mg | Freq: Two times a day (BID) | INTRAVENOUS | Status: DC

## 2015-07-22 MED ORDER — TAMSULOSIN HCL 0.4 MG PO CAPS
0.4000 mg | ORAL_CAPSULE | Freq: Every day | ORAL | Status: DC
  Administered 2015-07-24 – 2015-07-27 (×4): 0.4 mg via ORAL
  Filled 2015-07-22 (×6): qty 1

## 2015-07-22 MED ORDER — IOPAMIDOL (ISOVUE-370) INJECTION 76%
INTRAVENOUS | Status: AC
  Administered 2015-07-22: 50 mL
  Filled 2015-07-22: qty 50

## 2015-07-22 MED ORDER — ANTISEPTIC ORAL RINSE SOLUTION (CORINZ)
7.0000 mL | Freq: Four times a day (QID) | OROMUCOSAL | Status: DC
  Administered 2015-07-22 – 2015-07-23 (×7): 7 mL via OROMUCOSAL

## 2015-07-22 MED ORDER — INSULIN ASPART 100 UNIT/ML ~~LOC~~ SOLN: 0.0000 [IU] | SUBCUTANEOUS | Status: DC

## 2015-07-22 MED ORDER — MIDAZOLAM HCL 2 MG/2ML IJ SOLN: 1.0000 mg | INTRAMUSCULAR | Status: DC | PRN

## 2015-07-22 MED ORDER — STROKE: EARLY STAGES OF RECOVERY BOOK
Freq: Once | Status: AC
  Administered 2015-07-22: 01:00:00
  Filled 2015-07-22: qty 1

## 2015-07-22 NOTE — Consult Note (Signed)
Neurology Consultation Reason for Consult: stroke  HPI: Jay [name] is a 70 y.o. male with a history of uncontrolled diabetes mellitus, hypertension, coronary artery disease initially presented to [facility] in [facility] with altered mental status. Onset of symptoms 6 PM per family. In the emergency room patient developed right-sided weakness and right gaze preference difficulty swallowing and worsening confusion. He was also found to have blood sugar of 486, creatinine mildly elevated 1.62 potassium 3.9 sodium 130, hemoglobin 13.6 blood pressure 137/88 Initial CT scan of the head did not show any acute abnormality. Patient was examined by ED staff who consulted to neurology in [facility] and was treated with IV TPA at 9:07 PM  Subsequently patient was unable to protect his airway and was intubated and sedated and transferred to our facility. Currently patient is sedated but turning off the sedation he does not follow any commands but has mild spontaneous movement of all 4 extremities with painful stimuli.  Past Medical History  Diagnosis Date  . Diabetes mellitus without complication (HCC)   . Hypertension   . Anemia   . Angina pectoris (HCC)   . CAD (coronary artery disease)   . Foot deformities, congenital   . Fatty liver   . GERD (gastroesophageal reflux disease)   . Bilateral diabetic retinopathy (HCC)   . Morbid obesity (HCC)   . Pedal edema   . Sinoatrial node dysfunction (HCC)   . Sleep apnea     not tolerating CPAP   Past Surgical History  Procedure Laterality Date  . Coronary angioplasty with stent placement  2006 and 2008  . Cardiac catheterization Left 07/10/2015    Procedure: Coronary Angiography;  Surgeon: Lamar Blinks, MD;  Location: [facility] INVASIVE CV LAB;  Service: Cardiovascular;  Laterality: Left;   Allergies Metformin  meds prior to this admission Aspirin 81 mg daily Ferrous sulfate 325 mg 2 tablets  daily Glimepiride 4 mg 2 tablet twice a day Glipizide 5 mg 1 tablet twice a day before meals Levemir  85 units every night Claritin 10 mg daily Meclizine 25 mg 1 tablet 3 times a day as needed Lopressor 25 mg half tablet twice a day Protonix 40 mg twice a day Phenergan 25 mg as needed Crestor 10 mg daily Zoloft 50 mg daily Flomax 0.4 mg at bedtime Tradjenta 5 mg tablet daily  Family History  Problem Relation Age of Onset  . Leukemia Mother   . Cancer Father   . Deep vein thrombosis Father   . Psychiatric Illness Brother   . Heart disease Brother     Social History:  reports that he has never smoked. He has never used smokeless tobacco. He reports that he does not drink alcohol or use illicit drugs.  ROS Unobtainable due to patient mental status  Exam: Current vital signs: BP 107/59 mmHg  Pulse 100  Resp 15  Ht 5\' 9"  (1.753 m)  Wt 119.7 kg (263 lb 14.3 oz)  BMI 38.95 kg/m2  SpO2 100% Vital signs in last 24 hours: Temp:  [97.7 F (36.5 C)] 97.7 F (36.5 C) (06/18 2250) Pulse Rate:  [80-115] 100 (06/19 0130) Resp:  [9-35] 15 (06/19 0130) BP: (101-193)/(59-146) 107/59 mmHg (06/19 0130) SpO2:  [90 %-100 %] 100 % (06/19 0130) FiO2 (%):  [40 %-100 %] 40 % (06/19 0032) Weight:  [119.7 kg (263 lb 14.3 oz)-124.558 kg (274 lb 9.6 oz)] 119.7 kg (263 lb 14.3 oz) (06/19 0100)  Physical Exam  Lying in  bed intubated and sedated Lungs good air entry on ventilatory support CVS S1-S2 regular Skin no signs or scars or bruises Neurologic Currently patient is sedated but turning off the sedation he does not follow any commands but has mild spontaneous movement of all 4 extremities with painful stimuli. Pupils are reactive bilaterally  Assessment and recommendation this is a 70 year old male with multiple vascular risk factors including uncontrolled diabetes mellitus, hypertension hyperlipidemia and coronary artery disease status post coronary artery stent placement who initially  presented an outside facility Hospital with acute onset of altered mental status at 6 PM. Upon presentation his blood sugars were reported at 486. In the emergency room he was also found to have very significant right-sided weakness followed by worsening mental status and unable to protect his airway. Patient was treated with IV TPA with clinical diagnosis of stroke after case was discussed with tele neurology in [facility] and subsequently was transferred to our facility in neuro ICU for further management. Currently patient is sedated but turning off the sedation he does not follow any commands but has mild spontaneous movement of all 4 extremities with painful stimuli.  Differential diagnosis stroke versus seizure. Discussed with family in detail. Continue symptomatic management. Once patient is extubated will obtain MRI brain and MRA head and neck Resume aspirin after 24 hours Recommend strict control of blood sugars, maintain blood pressure between 140-160 systolic  Cy Blamer, MD 2:02 AM

## 2015-07-22 NOTE — Progress Notes (Signed)
EEG completed  Bedside full report to follow

## 2015-07-22 NOTE — Procedures (Signed)
Extubation Procedure Note  Patient Details:   Name: Jay [name] DOB: [birth year] MRN: [id number]   Airway Documentation:     Evaluation  O2 sats: stable throughout Complications: No apparent complications Patient did tolerate procedure well. Bilateral Breath Sounds: Clear, Diminished   Yes   Pt extubated to 3L  per MD order. Pt stable throughout with no complications. Vital signs within normal limit. Pt has a strong productive cough and was able to speak post extubation. Pt has hx of OSA so Bipap ordered QHS. RT will continue to monitor.   Carolan Shiver 07/22/2015, 12:41 PM

## 2015-07-22 NOTE — Procedures (Signed)
ELECTROENCEPHALOGRAM REPORT  Date of Study: 07/22/2015  Patient's Name: Jay [name] MRN: [id number] Date of Birth: 1945/11/01  Referring Provider: Rosalin Hawking, MD  Indication: 70 year old male with multiple vascular risk factors including uncontrolled diabetes mellitus, hypertension hyperlipidemia and coronary artery disease status post coronary artery stent placement who presented with altered mental status and right-sided weakness.  Patient was treated with IV TPA.  Medications: 0.9 % sodium chloride infusion antiseptic oral rinse solution (CORINZ) chlorhexidine gluconate (SAGE KIT) (PERIDEX) 0.12 % solution 15 mL Chlorhexidine Gluconate Cloth 2 % PADS 6 each fentaNYL (SUBLIMAZE) 2,500 mcg in sodium chloride 0.9 % 250 mL (10 mcg/mL) infusion fentaNYL (SUBLIMAZE) bolus via infusion 25 mcg insulin aspart (novoLOG) injection 2-6 Units insulin regular (NOVOLIN R,HUMULIN R) 250 Units in sodium chloride 0.9 % 250 mL (1 Units/mL) infusion midazolam (VERSED) injection 1 mg mupirocin ointment (BACTROBAN) 2 % 1 application pantoprazole (PROTONIX) 80 mg in sodium chloride 0.9 % 250 mL (0.32 mg/mL) infusion pantoprazole (PROTONIX) injection 40 mg phenylephrine (NEO-SYNEPHRINE) 10 mg in dextrose 5 % 250 mL (0.04 mg/mL) infusion propofol (DIPRIVAN) 1000 MG/100ML infusion rosuvastatin (CRESTOR) tablet 10 mg tamsulosin (FLOMAX) capsule  Technical Summary: This is a multichannel digital EEG recording, using the international 10-20 placement system with electrodes applied with paste and impedances below 5000 ohms.    Description: The EEG background is symmetric with generalized slowing of mixed 2-3 Hz delta and 5-6 Hz theta activity and posterior dominant rhythm of 6 to 7 Hz.  No focal or generalized epileptiform discharges seen.  Stage II sleep is not seen.  Hyperventilation and photic stimulation were not performed.  ECG revealed normal cardiac rate and rhythm.  Impression: This is  an abnormal EEG due to generalized slowing, indicative of encephalopathy.  This is a nonspecific finding which may be due to toxic-metabolic, infectious, hypoxic, pharmacologic or other diffuse physiologic abnormality.  Adam R. Tomi Likens, DO

## 2015-07-22 NOTE — Progress Notes (Signed)
PULMONARY / CRITICAL CARE MEDICINE   Name: Jay [name] MRN: [id number] DOB: 02-22-1945    ADMISSION DATE:  07/22/2015 CONSULTATION DATE:  07/22/15  REFERRING MD:    CHIEF COMPLAINT:  Altered mental status  HISTORY OF PRESENT ILLNESS:   Jay [name] is a 55M who presented to [place] 6/18 with complaints of altered mental status. Initial evaluation revealed marked hyperglycemia, but during evaluation he reportedly became more altered, had a rightward gaze preference and dropped his sats. He was intubated and his case was discussed with tele-neurology. It is unclear what their assessment or recommendations were, but he was ultimately given tPA out of concern for a brainstem stroke. After administration of tPA he reportedly had about 600cc of bloody OGT output.   On arrival here, he is intubated, sedated on propofol and his exam is quite limited. When sedation is turned off, he moves all extremities to noxious stimuli. He does not follow commands.   This am, he was noted to follow some commands from Neuro. Was down at CT scan where he became very agitated, moving BUE, so he was sedated.   SUBJECTIVE:  Sedated now. Was agitated earlier today while down at ct scan. Sedated now. Still on insulin drip.    VITAL SIGNS: BP 92/54 mmHg  Pulse 89  Temp(Src) 98 F (36.7 C) (Oral)  Resp 0  Ht  (1.753 m)  Wt 119.7 kg (263 lb 14.3 oz)  BMI 38.95 kg/m2  SpO2 98%  HEMODYNAMICS:    VENTILATOR SETTINGS: Vent Mode:  [-] PRVC FiO2 (%):  [40 %-100 %] 40 % Set Rate:  [15 bmp-20 bmp] 15 bmp Vt Set:  [500 mL-570 mL] 570 mL PEEP:  [5 cmH20] 5 cmH20 Plateau Pressure:  [16 cmH20] 16 cmH20  INTAKE / OUTPUT: I/O last 3 completed shifts: In: 212.6 [I.V.:212.6] Out: 860 [Urine:860]  PHYSICAL EXAMINATION:  General Well nourished, well developed, obese, intubated, sedated  HEENT No gross abnormalities. Pupils equal, round, reactive. ETT / OGT in place  Pulmonary Clear to auscultation  bilaterally with no wheezes, rales or ronchi. Good effort, symmetrical expansion.   Cardiovascular Normal rate, regular rhythm. S1, s2. No m/r/g. Distal pulses palpable.  Abdomen Soft, non-tender, non-distended, positive bowel sounds, no palpable organomegaly or masses. Normoresonant to percussion.  Musculoskeletal Grossly normal  Lymphatics No cervical, supraclavicular or axillary adenopathy.   Neurologic Grossly intact. No focal deficits.   Skin/Integuement No rash, no cyanosis, no clubbing. No edema.      LABS:  BMET  Recent Labs Lab 07/21/15 1927 07/22/15 0112 07/22/15 0330  NA 130* 130* 132*  K 3.9 6.5* 5.8*  CL 92* 96* 97*  CO2 BUN 30* 27* 35*  CREATININE 1.62* 1.54* 1.68*  GLUCOSE 486* 529* 540*    Electrolytes  Recent Labs Lab 07/21/15 1927 07/22/15 0112 07/22/15 0330  CALCIUM 8.9 8.3* 8.3*    CBC  Recent Labs Lab 07/22/15 0112 07/22/15 0330 07/22/15 0707  WBC 14.6* 15.2* 13.9*  HGB 11.8* 11.2* 10.7*  HCT 36.0* 35.1* 33.9*  PLT 165 175 143*    Coag's  Recent Labs Lab 07/22/15 0330  APTT 29  INR 1.29    Sepsis Markers No results for input(s): LATICACIDVEN, PROCALCITON, O2SATVEN in the last 168 hours.  ABG  Recent Labs Lab 07/21/15 2130  PHART 7.29*  PCO2ART 57*  PO2ART 433*    Liver Enzymes  Recent Labs Lab 07/21/15 1927 07/22/15 0330  AST 103* 71*  ALT 88* 75*  ALKPHOS 259* 199*  BILITOT 1.1 1.1  ALBUMIN 3.5 2.8*    Cardiac Enzymes  Recent Labs Lab 07/21/15 1927  TROPONINI 0.03    Glucose  Recent Labs Lab 07/21/15 1923 07/21/15 2041 07/22/15 0127  GLUCAP 479* 403* 535*    Imaging Dg Chest 1 View  07/21/2015  CLINICAL DATA:  Endotracheal tube and orogastric tube placement. Initial encounter. EXAM: CHEST 1 VIEW COMPARISON:  Chest radiograph performed earlier today at 9:06 p.m. FINDINGS: The patient's endotracheal tube is seen ending 3 cm above the carina. An enteric tube is noted extending below  the diaphragm. The lungs are hypoexpanded. Vascular crowding and vascular congestion are noted. No pleural effusion or pneumothorax is seen. The cardiomediastinal silhouette is borderline normal in size. Scattered coronary artery calcifications are noted. No acute osseous abnormalities are identified. An external pacing pad is noted. IMPRESSION: 1. Endotracheal tube seen ending 3 cm above the carina. 2. Enteric tube noted extending below the diaphragm. 3. Lungs hypoexpanded, with vascular congestion. No focal airspace consolidation seen. 4. Scattered coronary artery calcifications seen. Electronically Signed   By: Roanna Raider M.D.   On: 07/21/2015 23:53   Dg Abd 1 View  07/21/2015  CLINICAL DATA:  Orogastric tube placement.  Initial encounter. EXAM: ABDOMEN - 1 VIEW COMPARISON:  None. FINDINGS: An enteric tube is noted coiling within the stomach. The visualized bowel gas pattern is unremarkable. Scattered air and stool filled loops of colon are seen; no abnormal dilatation of small bowel loops is seen to suggest small bowel obstruction. No free intra-abdominal air is identified, though evaluation for free air is limited on a single supine view. The visualized osseous structures are within normal limits; the sacroiliac joints are unremarkable in appearance. The visualized lung bases are essentially clear. External pacing pads are noted. IMPRESSION: Enteric tube noted coiling within the stomach. Electronically Signed   By: Roanna Raider M.D.   On: 07/21/2015 23:54   Ct Head Wo Contrast  07/21/2015  CLINICAL DATA:  Elevated blood sugar. Altered mental status today. Initial encounter. EXAM: CT HEAD WITHOUT CONTRAST TECHNIQUE: Contiguous axial images were obtained from the base of the skull through the vertex without intravenous contrast. COMPARISON:  Head CT scan 12/30/2013. FINDINGS: Cortical atrophy is again seen. No evidence of acute intracranial abnormality including hemorrhage, infarct, mass lesion, mass  effect, midline shift or abnormal extra-axial fluid collection. No hydrocephalus or pneumocephalus. The calvarium is intact. Imaged paranasal sinuses and mastoid air cells are clear. IMPRESSION: No acute abnormality. Atrophy. Electronically Signed   By: Drusilla Kanner M.D.   On: 07/21/2015 19:44   Dg Chest Port 1 View  07/22/2015  CLINICAL DATA:  Status post CVA, acute renal insufficiency, coronary artery disease, diabetes. EXAM: PORTABLE CHEST 1 VIEW COMPARISON:  Portable chest x-ray of July 21, 2015 FINDINGS: The lungs are mildly hypoinflated. The retrocardiac region on the left is more dense today. The cardiac silhouette is larger today. The central pulmonary vascularity is prominent. The endotracheal tube tip lies approximately 3.8 cm above the carina. IMPRESSION: Interval increase in the conspicuity of the cardiac silhouette and pulmonary vascularity suggests mild CHF. There is no significant pleural effusion and no evidence of pneumonia. Developing atelectasis in the left lower lobe is suspected however. Electronically Signed   By: David  [place] M.D.   On: 07/22/2015 07:31   Dg Chest Portable 1 View  07/21/2015  CLINICAL DATA:  Post intubation. EXAM: PORTABLE CHEST 1 VIEW COMPARISON:  05/29/2015 FINDINGS: Endotracheal tube has tip 2.8  cm above the carina. Lungs are hypoinflated with bilateral perihilar opacification likely mild interstitial edema and less likely infection. No evidence of effusion or pneumothorax. Mild stable cardiomegaly. Coronary stent over the left heart. Remainder of the exam is unchanged. IMPRESSION: Perihilar opacification likely interstitial edema and less likely infection. Mild stable cardiomegaly. Endotracheal tube with tip 2.8 cm above the carina. Electronically Signed   By: Elberta Fortis M.D.   On: 07/21/2015 21:26     STUDIES:  As above  CULTURES: None MRSA 6/19 (+)   ANTIBIOTICS: none  SIGNIFICANT EVENTS:   LINES/TUBES: ETT 6/18 >> OGT 6/18 >> Foley 6/18  >>  DISCUSSION: Mr. Leiker is a 73M with history of poorly controlled diabetes, htn, CAD, who presented to the ED with altered mental status and had an acute episode concerning for stroke and was given tPA. He was markedly hyperglycemic. Post tPA there is concern for GI bleeding.    ASSESSMENT / PLAN:  PULMONARY A: Acute hypoxemic/hypercapneic resp failure 2/2 unable to protect airway.  Need for intubation for airway protection P:   Continue ventilatory support Wean off sedation.  PST when awake. Anticipate we can extubate soon. CTA head per neuro was U/R. \  CARDIOVASCULAR A:  Hx HTN P:  Initial blood pressures 130s-150s systolic Per neuro, maintain BP 140-160 systolic BP lower now 2/2 sedation. Wean off sedation.   RENAL A:   Acute on chronic kidney disease (Cr. 1.62; baseline ~1.2) CKD 3A Hyponatremia/hypoxhloremia Hyperkalemia P:   K better.  Observe K, lytes. May need diuresis.   GASTROINTESTINAL A:   Possible GI bleed P:   Protonix gtt Trend Hgb  HEMATOLOGIC A:   S/p tPA Concern for GI bleed P:  Monitor closely Trend Hgb  INFECTIOUS A:   Leukocytosis likely stress response P:   No abx for now Check PCT.   ENDOCRINE A:   Hyperglycemia / uncontrolled diabetes mellitus - last A1c 12.3 05/23/15 P:   Cont insulin drip.   NEUROLOGIC A:   Concern for acute stroke s/p tPA at 2107 on 07/21/15 CTA cranial > per RN, neuro said CTA was U/R P:   RASS goal: 0 Monitor for s/s of bleeding Follow neuro exam Neurology following MRI per neuro when extubated   FAMILY  - Updates: no family available. Tried calling wife and son > no answer.   - Inter-disciplinary family meet or Palliative Care meeting due by:  day 7  Critical Care time with this patient today : 33 minutes.   Pollie Meyer, MD 07/22/2015, 11:20 AM [facility] Pager [phone] After 3 pm or if no answer, call [phone]

## 2015-07-22 NOTE — Progress Notes (Signed)
eLink Physician-Brief Progress Note Patient Name: Jay [name] DOB: Sep 15, 1945 MRN: [id number]   Date of Service  07/22/2015  HPI/Events of Note  Pt presented to [facility] with encephalopathy and hyperglycemia.  Case reviewed by [facility] EDP with tele-neurology >> advised to give tPA.  Informed that pt has recent GI bleed, and has bloody outpt from NG tube.  Also had recent cardiac cath >> recommended medical management.   eICU Interventions  Will check labs.  Will add protonix gtt.  Will start insulin gtt.      Intervention Category Evaluation Type: Other  [name] 07/22/2015, 1:35 AM

## 2015-07-22 NOTE — Progress Notes (Signed)
RT transported pt to and from 3M11 to CT 1 on ventilator with no complications. Pt stable throughout. RT will continue to monitor.

## 2015-07-22 NOTE — Progress Notes (Addendum)
STROKE TEAM PROGRESS NOTE   HISTORY OF PRESENT ILLNESS (per record) Jay [name] is a 70 y.o. male with a history of uncontrolled diabetes mellitus, hypertension, coronary artery disease who initially presented to [facility] in [facility] with altered mental status. Onset of symptoms 6 PM 07/21/2015 per family. In the emergency room patient developed right-sided weakness and right gaze preference difficulty swallowing and worsening confusion. He was also found to have blood sugar of 486, creatinine mildly elevated 1.62 potassium 3.9 sodium 130, hemoglobin 13.6 blood pressure 137/88 Initial CT scan of the head did not show any acute abnormality. Patient was examined by ED staff who consulted to neurology in [facility] and was treated with IV TPA at 9:07 PM. Subsequently patient was unable to protect his airway and was intubated and sedated and transferred to our facility. Upon arrival, patient is sedated but turning off the sedation he does not follow any commands but has mild spontaneous movement of all 4 extremities with painful stimuli.Marland Kitchen He was admitted to the neuro ICU  for further evaluation and treatment.   SUBJECTIVE (INTERVAL HISTORY) His RN is at the bedside.  Pt BP was low on propofol and fentanyl. However, without sedation pt was very agitation. Will put on neo. Pt is spontaneously moving all his extremities symmetrically now. No eyes deviation at this time but pinpoint pupils. CTA head and neck scheduled for 10a.   OBJECTIVE Temp:  [97.7 F (36.5 C)-98 F (36.7 C)] 98 F (36.7 C) (06/19 0800) Pulse Rate:  [45-115] 99 (06/19 0900) Cardiac Rhythm:  [-] Sinus tachycardia (06/19 0400) Resp:  [8-35] 13 (06/19 0900) BP: (77-196)/(30-176) 86/30 mmHg (06/19 0900) SpO2:  [90 %-100 %] 97 % (06/19 0900) FiO2 (%):  [40 %-100 %] 40 % (06/19 0815) Weight:  [119.7 kg (263 lb 14.3 oz)-124.558 kg (274 lb 9.6 oz)] 119.7 kg (263 lb 14.3 oz) (06/19  0100)  CBC:  Recent Labs Lab 07/21/15 1927  07/22/15 0330 07/22/15 0707  WBC 8.4  < > 15.2* 13.9*  NEUTROABS 6.0  --  12.5*  --   HGB 13.6  < > 11.2* 10.7*  HCT 41.1  < > 35.1* 33.9*  MCV 86.8  < > 86.7 85.8  PLT 155  < > 175 143*  < > = values in this interval not displayed.  Basic Metabolic Panel:   Recent Labs Lab 07/22/15 0112 07/22/15 0330  NA 130* 132*  K 6.5* 5.8*  CL 96* 97*  CO2 25 24  GLUCOSE 529* 540*  BUN 27* 35*  CREATININE 1.54* 1.68*  CALCIUM 8.3* 8.3*    Lipid Panel:     Component Value Date/Time   CHOL 136 07/22/2015 0112   TRIG 123 07/22/2015 0112   TRIG 121 07/22/2015 0112   HDL 53 07/22/2015 0112   CHOLHDL 2.6 07/22/2015 0112   VLDL 25 07/22/2015 0112   LDLCALC 58 07/22/2015 0112   HgbA1c:  Lab Results  Component Value Date   HGBA1C 12.3* 05/23/2015   Urine Drug Screen: No results found for: LABOPIA, COCAINSCRNUR, LABBENZ, AMPHETMU, THCU, LABBARB    IMAGING  Ct Head Wo Contrast 07/21/2015   No acute abnormality. Atrophy.    Dg Chest Port 1 View 07/22/2015  Interval increase in the conspicuity of the cardiac silhouette and pulmonary vascularity suggests mild CHF. There is no significant pleural effusion and no evidence of pneumonia. Developing atelectasis in the left lower lobe is suspected however.  07/21/2015   Perihilar opacification likely  interstitial edema and less likely infection. Mild stable cardiomegaly. Endotracheal tube with tip 2.8 cm above the carina.   CTA head and neck - pending  MRI pending  EEG pending   PHYSICAL EXAM  Temp:  [97.7 F (36.5 C)-98 F (36.7 C)] 98 F (36.7 C) (06/19 0800) Pulse Rate:  [45-115] 99 (06/19 0900) Resp:  [8-35] 13 (06/19 0900) BP: (77-196)/(30-176) 86/30 mmHg (06/19 0900) SpO2:  [90 %-100 %] 97 % (06/19 0900) FiO2 (%):  [40 %-100 %] 40 % (06/19 0815) Weight:  [263 lb 14.3 oz (119.7 kg)-274 lb 9.6 oz (124.558 kg)] 263 lb 14.3 oz (119.7 kg) (06/19 0100)  General - Well nourished,  well developed, intubated and sedated. With holding sedation, pt extremely agitated.  Ophthalmologic - Fundi not visualized.  Cardiovascular - Regular rate and rhythm.  Neuro - intubated and sedated. With holding off sedation, he was agitated. Not following commands. Barely open eyes on pain stimulation, eyes at mid position, pinpoint pupils, doll's eye present. Corneal and gag present. Strong gag and cough reflexes. Moving both arm strongly for agitation. Both LEs 3-/5 for pain stimulation, symmetric. DTR 1+, b/l Babinski equivocal. Sensation and coordination and gait not tested.   ASSESSMENT/PLAN Mr. Jay [name] is a 70 y.o. male with history of uncontrolled diabetes mellitus, hypertension hyperlipidemia and coronary artery disease status post stent presenting with altered mental status who developed R  Gaze and R sided weakness and respiratory failure in the ED. He received IV t-PA 07/20/2016 at 2107. He was intubated and admitted to ICU.  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 Plan aspirin suppository if imaging negative for hemorrhage.  Ongoing aggressive stroke risk factor management  Therapy recommendations:  pending   Disposition:  pending   Acute Respiratory Failure   Intubated in the ED  On sedation   CCM managing  Hypotension Hx Hypertension  Unstable  SBP goal listed as 140-160, must keep less than 180 post tPA  BP currently 97/59   Will start neo to keep BP 110-130 (if vessel occlusion present, will increase goal to 140-160)  Long-term BP goal  normotensive  Hyperlipidemia  Home meds:  crestor 10  LDL 58, goal < 70  Resume statin in hospital when able  Continue statin at discharge  Diabetes Bilateral diabetic retinopathy  HgbA1c 12.3 in April, goal < 7.0  On insulin drip   Uncontrolled  ?? GIB  Hx of recent GIB  On protonix drip  Hb stable  CBC monitoring  Other Stroke Risk Factors  Advanced age  Obesity, Body mass index is 38.95 kg/(m^2)., recommend weight loss, diet and exercise as appropriate   Coronary artery disease s/p stent 2006 and 2008  Obstructive sleep apnea, no tolerating CPAP  Other Active Problems  Hx recent GIB, bloody output from NG. On protonix drip  Hospital day # 0  Rhoderick Moody [facility] See Amion for Pager information 07/22/2015 9:25 AM   This patient is critically ill due to VDRF, presumed stroke s/p tPA, hypotension, questionable  for seizure and at significant risk of neurological worsening, death form ICH, recurrent stroke, status epilepticus and heart failure. This patient's care requires constant monitoring of vital signs, hemodynamics, respiratory and cardiac monitoring, review of multiple databases, neurological assessment, discussion with family, other specialists and medical decision making of high complexity. I spent 45 minutes of neurocritical care time in the care of this patient.  Pt admitted for AMS, right eye gaze and right sided weakness. Found to have super high glucose. Currently mover all extremities symmetrically, agitated on vent. Low BP on sedation, will need neo. CTA head and neck preliminary no large vessel occlusion. DDx including brainstem infarct, vs. Seizure, vs. Hyperglycemia due to uncontrolled DM. Will need MRI and EEG. Vent manage per CCM. OK to extubate from neuro standpoint. No endovascular intervention needed.  Marvel Plan, MD PhD Stroke Neurology 07/22/2015 11:11 AM   To contact Stroke Continuity provider, please refer to  [email or web address]. After hours, contact General Neurology

## 2015-07-22 NOTE — Progress Notes (Signed)
Patient returned from 24 hour post t-Pa CT scan. No evidence of hemorrhage. Dr. Roseanne Reno notified. No order received for aspirin as patient does have a GI bleed and is on a protonix gtt.

## 2015-07-22 NOTE — H&P (Signed)
PULMONARY / CRITICAL CARE MEDICINE   Name: Jay [name] MRN: [id number] DOB: [birth year]    ADMISSION DATE:  07/22/2015 CONSULTATION DATE:  07/22/15  REFERRING MD:    CHIEF COMPLAINT:  Altered mental status  HISTORY OF PRESENT ILLNESS:   Jay [name] is a 68M who presented to [place] 6/18 with complaints of altered mental status. Initial evaluation revealed marked hyperglycemia, but during evaluation he reportedly became more altered, had a rightward gaze preference and dropped his sats. He was intubated and his case was discussed with tele-neurology. It is unclear what their assessment or recommendations were, but he was ultimately given tPA out of concern for a brainstem stroke. After administration of tPA he reportedly had about 600cc of bloody OGT output.   On arrival here, he is intubated, sedated on propofol and his exam is quite limited. When sedation is turned off, he moves all extremities to noxious stimuli. He does not follow commands.   PAST MEDICAL HISTORY :  He  has a past medical history of Diabetes mellitus without complication (HCC); Hypertension; Anemia; Angina pectoris (HCC); CAD (coronary artery disease); Foot deformities, congenital; Fatty liver; GERD (gastroesophageal reflux disease); Bilateral diabetic retinopathy (HCC); Morbid obesity (HCC); Pedal edema; Sinoatrial node dysfunction (HCC); and Sleep apnea.  PAST SURGICAL HISTORY: He  has past surgical history that includes Coronary angioplasty with stent (2006 and 2008) and Cardiac catheterization (Left, 07/10/2015).  Allergies  Allergen Reactions  . Metformin Nausea Only    No current facility-administered medications on file prior to encounter.   Current Outpatient Prescriptions on File Prior to Encounter  Medication Sig  . aspirin 81 MG tablet Take 1 tablet by mouth daily.  . ferrous sulfate 325 (65 FE) MG tablet Take 1 tablet (325 mg total) by mouth 2 (two) times daily.  Marland Kitchen glimepiride (AMARYL) 4 MG tablet  TAKE 2 TABLETS BY MOUTH TWICE DAILY.  Marland Kitchen glipiZIDE (GLUCOTROL) 5 MG tablet Take 1 tablet (5 mg total) by mouth 2 (two) times daily before a meal.  . LEVEMIR FLEXTOUCH 100 UNIT/ML Pen INJECT 85 UNITS SUBCUTANEOUSLY EVERY NIGHT.  Marland Kitchen loratadine (CLARITIN) 10 MG tablet Take 1 tablet (10 mg total) by mouth daily.  . meclizine (ANTIVERT) 25 MG tablet TAKE 1 TABLET BY MOUTH 3 TIMES DAILY AS NEEDED.  Marland Kitchen mometasone (ELOCON) 0.1 % cream Apply 1 application topically daily. For 2 weeks  . pantoprazole (PROTONIX) 40 MG tablet TAKE 1 TABLET BY MOUTH TWICE DAILY.  Marland Kitchen promethazine (PHENERGAN) 25 MG tablet Take 1 tablet (25 mg total) by mouth every 4 (four) hours as needed for nausea or vomiting.  . rosuvastatin (CRESTOR) 10 MG tablet Take 1 tablet (10 mg total) by mouth daily.  . SODIUM CHLORIDE, EXTERNAL, ([employer] SALINE WOUND WASH) 0.9 % SOLN Apply 2 application topically 2 (two) times daily.  . tamsulosin (FLOMAX) 0.4 MG CAPS capsule Take 1 capsule (0.4 mg total) by mouth daily.  . TRADJENTA 5 MG TABS tablet TAKE 1 TABLET BY MOUTH ONCE DAILY.    FAMILY HISTORY:  His indicated that his mother is deceased. He indicated that his father is deceased. He indicated that his sister is alive. He indicated that only one of his two brothers is alive.   SOCIAL HISTORY: He  reports that he has never smoked. He has never used smokeless tobacco. He reports that he does not drink alcohol or use illicit drugs.  REVIEW OF SYSTEMS:   Unable to obtain 2/2 intubated/sedated state  SUBJECTIVE:    VITAL SIGNS: Ht   (1.753 m)  HEMODYNAMICS:    VENTILATOR SETTINGS: Vent Mode:  [-] AC FiO2 (%):  [70 %-100 %] 70 % Set Rate:  [16 bmp-20 bmp] 20 bmp Vt Set:  [500 mL-570 mL] 570 mL PEEP:  [5 cmH20] 5 cmH20  INTAKE / OUTPUT:    PHYSICAL EXAMINATION:  General Well nourished, well developed, obese, intubated, sedated  HEENT No gross abnormalities. Pupils equal, round, reactive. ETT / OGT in place  Pulmonary Clear to  auscultation bilaterally with no wheezes, rales or ronchi. Good effort, symmetrical expansion.   Cardiovascular Normal rate, regular rhythm. S1, s2. No m/r/g. Distal pulses palpable.  Abdomen Soft, non-tender, non-distended, positive bowel sounds, no palpable organomegaly or masses. Normoresonant to percussion.  Musculoskeletal Grossly normal  Lymphatics No cervical, supraclavicular or axillary adenopathy.   Neurologic Grossly intact. No focal deficits.   Skin/Integuement No rash, no cyanosis, no clubbing. No edema.      LABS:  BMET  Recent Labs Lab 07/21/15 1927  NA 130*  K 3.9  CL 92*  CO2 26  BUN 30*  CREATININE 1.62*  GLUCOSE 486*    Electrolytes  Recent Labs Lab 07/21/15 1927  CALCIUM 8.9    CBC  Recent Labs Lab 07/21/15 1927  WBC 8.4  HGB 13.6  HCT 41.1  PLT 155    Coag's No results for input(s): APTT, INR in the last 168 hours.  Sepsis Markers No results for input(s): LATICACIDVEN, PROCALCITON, O2SATVEN in the last 168 hours.  ABG  Recent Labs Lab 07/21/15 2130  PHART 7.29*  PCO2ART 57*  PO2ART 433*    Liver Enzymes  Recent Labs Lab 07/21/15 1927  AST 103*  ALT 88*  ALKPHOS 259*  BILITOT 1.1  ALBUMIN 3.5    Cardiac Enzymes  Recent Labs Lab 07/21/15 1927  TROPONINI 0.03    Glucose  Recent Labs Lab 07/21/15 1923 07/21/15 2041  GLUCAP 479* 403*    Imaging Dg Chest 1 View  07/21/2015  CLINICAL DATA:  Endotracheal tube and orogastric tube placement. Initial encounter. EXAM: CHEST 1 VIEW COMPARISON:  Chest radiograph performed earlier today at 9:06 p.m. FINDINGS: The patient's endotracheal tube is seen ending 3 cm above the carina. An enteric tube is noted extending below the diaphragm. The lungs are hypoexpanded. Vascular crowding and vascular congestion are noted. No pleural effusion or pneumothorax is seen. The cardiomediastinal silhouette is borderline normal in size. Scattered coronary artery calcifications are noted.  No acute osseous abnormalities are identified. An external pacing pad is noted. IMPRESSION: 1. Endotracheal tube seen ending 3 cm above the carina. 2. Enteric tube noted extending below the diaphragm. 3. Lungs hypoexpanded, with vascular congestion. No focal airspace consolidation seen. 4. Scattered coronary artery calcifications seen. Electronically Signed   By: Roanna Raider M.D.   On: 07/21/2015 23:53   Dg Abd 1 View  07/21/2015  CLINICAL DATA:  Orogastric tube placement.  Initial encounter. EXAM: ABDOMEN - 1 VIEW COMPARISON:  None. FINDINGS: An enteric tube is noted coiling within the stomach. The visualized bowel gas pattern is unremarkable. Scattered air and stool filled loops of colon are seen; no abnormal dilatation of small bowel loops is seen to suggest small bowel obstruction. No free intra-abdominal air is identified, though evaluation for free air is limited on a single supine view. The visualized osseous structures are within normal limits; the sacroiliac joints are unremarkable in appearance. The visualized lung bases are essentially clear. External pacing pads are noted. IMPRESSION: Enteric tube noted coiling within  the stomach. Electronically Signed   By: Roanna Raider M.D.   On: 07/21/2015 23:54   Ct Head Wo Contrast  07/21/2015  CLINICAL DATA:  Elevated blood sugar. Altered mental status today. Initial encounter. EXAM: CT HEAD WITHOUT CONTRAST TECHNIQUE: Contiguous axial images were obtained from the base of the skull through the vertex without intravenous contrast. COMPARISON:  Head CT scan 12/30/2013. FINDINGS: Cortical atrophy is again seen. No evidence of acute intracranial abnormality including hemorrhage, infarct, mass lesion, mass effect, midline shift or abnormal extra-axial fluid collection. No hydrocephalus or pneumocephalus. The calvarium is intact. Imaged paranasal sinuses and mastoid air cells are clear. IMPRESSION: No acute abnormality. Atrophy. Electronically Signed   By:  Drusilla Kanner M.D.   On: 07/21/2015 19:44   Dg Chest Portable 1 View  07/21/2015  CLINICAL DATA:  Post intubation. EXAM: PORTABLE CHEST 1 VIEW COMPARISON:  05/29/2015 FINDINGS: Endotracheal tube has tip 2.8 cm above the carina. Lungs are hypoinflated with bilateral perihilar opacification likely mild interstitial edema and less likely infection. No evidence of effusion or pneumothorax. Mild stable cardiomegaly. Coronary stent over the left heart. Remainder of the exam is unchanged. IMPRESSION: Perihilar opacification likely interstitial edema and less likely infection. Mild stable cardiomegaly. Endotracheal tube with tip 2.8 cm above the carina. Electronically Signed   By: Elberta Fortis M.D.   On: 07/21/2015 21:26     STUDIES:  As above  CULTURES: none  ANTIBIOTICS: none  SIGNIFICANT EVENTS:   LINES/TUBES: ETT 6/18 >> OGT 6/18 >> Foley 6/18 >>  DISCUSSION: Mr. Sumlin is a 20M with history of poorly controlled diabetes, htn, CAD, who presented to the ED with altered mental status and had an acute episode concerning for stroke and was given tPA. He was markedly hyperglycemic. Post tPA there is concern for GI bleeding.    ASSESSMENT / PLAN:  PULMONARY A: Need for intubation for airway protection P:   Confirm adequate position of ETT (currently 22cm at lip) with repeat CXR Continue ventilatory support Wean as tolerated SBT when able  CARDIOVASCULAR A:  Hx HTN P:  Initial blood pressures 130s-150s systolic Per neuro, maintain BP 140-160 systolic  RENAL A:   Acute on chronic kidney disease (Cr. 1.62; baseline ~1.2) Hyponatremia/hypoxhloremia Hyperkalemia P:   Redraw specimen to confirm hyperkalemia If high, temporize and kayexelate EKG Likely volume down with hyperglycemia; bolus IVF  GASTROINTESTINAL A:   Possible GI bleed P:   Protonix gtt Trend Hgb  HEMATOLOGIC A:   S/p tPA Concern for GI bleed P:  Monitor closely Trend Hgb  INFECTIOUS A:    Leukocytosis likely stress response P:   No abx for now  ENDOCRINE A:   Hyperglycemia / uncontrolled diabetes mellitus - last A1c 12.3 05/23/15 P:   Accuchecks and SSI  NEUROLOGIC A:   Concern for acute stroke s/p tPA at 2107 on 07/21/15 P:   RASS goal: 0 Monitor for s/s of bleeding Follow neuro exam Neurology following MRI per neuro when extubated   FAMILY  - Updates: no family available (son had already gone home)  - Inter-disciplinary family meet or Palliative Care meeting due by:  day 7  The patient is critically ill with multiple organ system failure and requires high complexity decision making for assessment and support, frequent evaluation and titration of therapies, advanced monitoring, review of radiographic studies and interpretation of complex data.   Critical Care Time devoted to patient care services, exclusive of separately billable procedures, described in this note is 35 minutes.  Nita Sickle, MD Pulmonary and Critical Care Medicine [facility] Pager: [phone]  07/22/2015, 1:24 AM

## 2015-07-22 NOTE — Progress Notes (Signed)
   [facility] PCCM  Pt doing well on PST. Awake, follows commands. CN intact. (-) lateralizing signs seen.  TV 500. Good cough.  Plan to extubate now.  Pt has OSA and not sure what settings are. Called wife > no answer.  Will place on Bipap 12/5 at [facility] and prn.   Pollie Meyer, MD 07/22/2015, 12:42 PM [place] [facility] Pager [phone] After 3 pm or if no answer, call [phone]

## 2015-07-23 ENCOUNTER — Ambulatory Visit: Payer: PPO | Admitting: Family Medicine

## 2015-07-23 ENCOUNTER — Inpatient Hospital Stay (HOSPITAL_COMMUNITY): Payer: PPO

## 2015-07-23 DIAGNOSIS — K625 Hemorrhage of anus and rectum: Secondary | ICD-10-CM | POA: Diagnosis not present

## 2015-07-23 DIAGNOSIS — Z794 Long term (current) use of insulin: Secondary | ICD-10-CM

## 2015-07-23 DIAGNOSIS — E1310 Other specified diabetes mellitus with ketoacidosis without coma: Secondary | ICD-10-CM | POA: Diagnosis not present

## 2015-07-23 DIAGNOSIS — G9340 Encephalopathy, unspecified: Secondary | ICD-10-CM | POA: Diagnosis not present

## 2015-07-23 DIAGNOSIS — J9601 Acute respiratory failure with hypoxia: Secondary | ICD-10-CM | POA: Diagnosis not present

## 2015-07-23 DIAGNOSIS — E1111 Type 2 diabetes mellitus with ketoacidosis with coma: Secondary | ICD-10-CM | POA: Insufficient documentation

## 2015-07-23 DIAGNOSIS — R4182 Altered mental status, unspecified: Secondary | ICD-10-CM | POA: Diagnosis not present

## 2015-07-23 LAB — GLUCOSE, CAPILLARY
GLUCOSE-CAPILLARY: 144 mg/dL — AB (ref 65–99)
GLUCOSE-CAPILLARY: 144 mg/dL — AB (ref 65–99)
GLUCOSE-CAPILLARY: 139 mg/dL — AB (ref 65–99)
GLUCOSE-CAPILLARY: 164 mg/dL — AB (ref 65–99)
GLUCOSE-CAPILLARY: 205 mg/dL — AB (ref 65–99)
GLUCOSE-CAPILLARY: 214 mg/dL — AB (ref 65–99)
Glucose-Capillary: 138 mg/dL — ABNORMAL HIGH (ref 65–99)
Glucose-Capillary: 166 mg/dL — ABNORMAL HIGH (ref 65–99)
Glucose-Capillary: 142 mg/dL — ABNORMAL HIGH (ref 65–99)

## 2015-07-23 LAB — HEMOGLOBIN A1C
Hgb A1c MFr Bld: 12.9 % — ABNORMAL HIGH (ref 4.8–5.6)
Mean Plasma Glucose: 324 mg/dL

## 2015-07-23 LAB — AMMONIA: Ammonia: 74 umol/L — ABNORMAL HIGH (ref 9–35)

## 2015-07-23 LAB — PROCALCITONIN: PROCALCITONIN: 0.12 ng/mL

## 2015-07-23 MED ORDER — ASPIRIN 81 MG PO CHEW
81.0000 mg | CHEWABLE_TABLET | Freq: Every day | ORAL | Status: DC
  Administered 2015-07-24 – 2015-07-27 (×4): 81 mg via ORAL
  Filled 2015-07-23 (×4): qty 1

## 2015-07-23 MED ORDER — DEXTROSE 10 % IV SOLN: INTRAVENOUS | Status: DC | PRN

## 2015-07-23 MED ORDER — INSULIN ASPART 100 UNIT/ML ~~LOC~~ SOLN
0.0000 [IU] | Freq: Three times a day (TID) | SUBCUTANEOUS | Status: DC
  Administered 2015-07-24: 5 [IU] via SUBCUTANEOUS

## 2015-07-23 MED ORDER — ASPIRIN 81 MG PO TABS: 81.0000 mg | ORAL_TABLET | Freq: Every day | ORAL | Status: DC

## 2015-07-23 MED ORDER — INSULIN GLARGINE 100 UNIT/ML ~~LOC~~ SOLN
30.0000 [IU] | SUBCUTANEOUS | Status: DC
  Administered 2015-07-23: 30 [IU] via SUBCUTANEOUS
  Filled 2015-07-23: qty 0.3

## 2015-07-23 NOTE — Progress Notes (Addendum)
PULMONARY / CRITICAL CARE MEDICINE   Name: Jay [name] MRN: [id number] DOB: Nov 02, 1945    ADMISSION DATE:  07/22/2015 CONSULTATION DATE:  07/22/15  REFERRING MD:    CHIEF COMPLAINT:  Altered mental status  HISTORY OF PRESENT ILLNESS:  Jay [name] is a 70M who presented to [facility] 6/18 with complaints of altered mental status.  Initial evaluation revealed marked hyperglycemia, but during evaluation he reportedly became more altered, had a rightward gaze preference and dropped his sats. He was intubated and his case was discussed with tele-neurology. It is unclear what their assessment or recommendations were, but he was ultimately given tPA out of concern for a brainstem stroke. After administration of tPA he reportedly had about 600cc of bloody OGT output.  On arrival here, he is intubated, sedated on propofol and his exam is quite limited. When sedation is turned off, he moves all extremities to noxious stimuli. He does not follow commands. He was later noted to follow some commands from Neuro. In CT scan he became very agitated, moving BUE, so he was sedated.  Extubated 6/19 without difficulty.   SUBJECTIVE:  Tolerated extubation 6/20 without difficulty.  Glucose 140-160's.  Off insulin gtt.  Remains on protonix gtt   VITAL SIGNS: BP 125/77 mmHg  Pulse 115  Temp(Src) 99 F (37.2 C) (Oral)  Resp 17  Ht 5\' 9"  (1.753 m)  Wt 263 lb 14.3 oz (119.7 kg)  BMI 38.95 kg/m2  SpO2 94%  HEMODYNAMICS:    VENTILATOR SETTINGS: Vent Mode:  [-] PRVC FiO2 (%):  [40 %] 40 % Set Rate:  [15 bmp] 15 bmp Vt Set:  [570 mL] 570 mL PEEP:  [5 cmH20] 5 cmH20 Plateau Pressure:  [17 cmH20] 17 cmH20  INTAKE / OUTPUT: I/O last 3 completed shifts: In: 2542.5 [I.V.:2542.5] Out: 3285 [Urine:3285]  PHYSICAL EXAMINATION:  General Well nourished, well developed, obese male in NAD  HEENT No gross abnormalities. Pupils equal, round, reactive.   Pulmonary Clear to auscultation bilaterally with no  wheezes, rales or ronchi. Good effort, symmetrical expansion.   Cardiovascular Normal rate, regular rhythm. S1, s2. No m/r/g. Distal pulses palpable.  Abdomen Soft, non-tender, non-distended, positive bowel sounds, no palpable organomegaly or masses. Normoresonant to percussion.  Musculoskeletal Grossly normal  Lymphatics No cervical, supraclavicular or axillary adenopathy.   Neurologic Grossly intact. No focal deficits.  Speech clear, oriented to self but not place / events.   Skin/Integuement No rash, no cyanosis, no clubbing. No edema.      LABS:  BMET  Recent Labs Lab 07/21/15 1927 07/22/15 0112 07/22/15 0330  NA 130* 130* 132*  K 3.9 6.5* 5.8*  CL 92* 96* 97*  CO2 26 25 24   BUN 30* 27* 35*  CREATININE 1.62* 1.54* 1.68*  GLUCOSE 486* 529* 540*    Electrolytes  Recent Labs Lab 07/21/15 1927 07/22/15 0112 07/22/15 0330  CALCIUM 8.9 8.3* 8.3*    CBC  Recent Labs Lab 07/22/15 0707 07/22/15 1133 07/22/15 1902  WBC 13.9* 15.0* 21.0*  HGB 10.7* 10.6* 11.2*  HCT 33.9* 33.2* 34.8*  PLT 143* 157 154    Coag's  Recent Labs Lab 07/22/15 0330  APTT 29  INR 1.29    Sepsis Markers  Recent Labs Lab 07/22/15 1133 07/23/15 0400  PROCALCITON 0.19 0.12    ABG  Recent Labs Lab 07/21/15 2130  PHART 7.29*  PCO2ART 57*  PO2ART 433*    Liver Enzymes  Recent Labs Lab 07/21/15 1927 07/22/15 0330  AST 103* 71*  ALT 88* 75*  ALKPHOS 259* 199*  BILITOT 1.1 1.1  ALBUMIN 3.5 2.8*    Cardiac Enzymes  Recent Labs Lab 07/21/15 1927  TROPONINI 0.03    Glucose  Recent Labs Lab 07/22/15 2154 07/22/15 2308 07/23/15 0044 07/23/15 0226 07/23/15 0325 07/23/15 0802  GLUCAP 133* 154* 138* 144* 144* 164*    Imaging Ct Angio Head W Or Wo Contrast  07/22/2015  CLINICAL DATA:  Altered mental status. Diabetes and hypertension. Right-sided weakness. EXAM: CT ANGIOGRAPHY HEAD AND NECK TECHNIQUE: Multidetector CT imaging of the head and neck was  performed using the standard protocol during bolus administration of intravenous contrast. Multiplanar CT image reconstructions and MIPs were obtained to evaluate the vascular anatomy. Carotid stenosis measurements (when applicable) are obtained utilizing NASCET criteria, using the distal internal carotid diameter as the denominator. CONTRAST:  50 mL Isovue 370 IV. Note that some of the contrast leaked out of the IV connection toward the end of the study. There is limited opacification of the arteries in the neck and head and the study is marginally diagnostic. Repeat study recommended when the patient is able to receive additional contrast. GFR is 40. COMPARISON:  CT head 07/21/2015 FINDINGS: Limited opacification of the arterial circulation. Repeat study recommended. CTA NECK Aortic arch: Mild atherosclerotic calcification aortic arch. Proximal great vessels are patent. Right carotid system: Atherosclerotic calcification proximal right internal carotid artery. Less than 50% diameter stenosis right internal carotid artery. Left carotid system: Atherosclerotic calcification in the carotid bifurcation and carotid bulb. Approximately 50% diameter stenosis left internal carotid artery. Vertebral arteries:Limited visualization of the vertebral arteries. Both vertebral arteries appear to be patent. Skeleton: Thoracic levoscoliosis. No fracture or skeletal mass lesion. Disc degeneration and spondylosis C4-5 and C5-6. Other neck: Patient is intubated. NG tube in the esophagus. No mass lesion in the neck. CTA HEAD Anterior circulation: Atherosclerotic calcification in the cavernous carotid bilaterally with mild stenosis bilaterally. Anterior and middle cerebral arteries are patent bilaterally. Limited vessel detail. Posterior circulation: Both vertebral arteries patent to the basilar. The basilar is patent. Posterior cerebral arteries are patent bilaterally. Limited vessel detail. Venous sinuses: Patent Anatomic variants:  None Delayed phase: Not performed. IMPRESSION: There is limited information on the study due to poor opacification of the arterial circulation. Repeat study is suggested. Mild calcific stenosis of the internal carotid artery bilaterally. No large vessel occlusion identified. These results were called by telephone at the time of interpretation on 07/22/2015 at 1:47 pm to Dr. Marvel Plan , who verbally acknowledged these results. Electronically Signed   By: Marlan Palau M.D.   On: 07/22/2015 13:48   Ct Head Wo Contrast  07/22/2015  CLINICAL DATA:  Stroke EXAM: CT HEAD WITHOUT CONTRAST TECHNIQUE: Contiguous axial images were obtained from the base of the skull through the vertex without intravenous contrast. COMPARISON:  CT head 07/21/2015 FINDINGS: Generalized atrophy.  Negative for hydrocephalus. Negative for acute infarct.  Negative for hemorrhage or mass Atherosclerotic calcification. Negative calvarium. IMPRESSION: No acute abnormality. Electronically Signed   By: Marlan Palau M.D.   On: 07/22/2015 21:03   Ct Angio Neck W Or Wo Contrast  07/22/2015  CLINICAL DATA:  Altered mental status. Diabetes and hypertension. Right-sided weakness. EXAM: CT ANGIOGRAPHY HEAD AND NECK TECHNIQUE: Multidetector CT imaging of the head and neck was performed using the standard protocol during bolus administration of intravenous contrast. Multiplanar CT image reconstructions and MIPs were obtained to evaluate the vascular anatomy. Carotid stenosis measurements (when applicable) are obtained utilizing NASCET criteria,  using the distal internal carotid diameter as the denominator. CONTRAST:  50 mL Isovue 370 IV. Note that some of the contrast leaked out of the IV connection toward the end of the study. There is limited opacification of the arteries in the neck and head and the study is marginally diagnostic. Repeat study recommended when the patient is able to receive additional contrast. GFR is 40. COMPARISON:  CT head  07/21/2015 FINDINGS: Limited opacification of the arterial circulation. Repeat study recommended. CTA NECK Aortic arch: Mild atherosclerotic calcification aortic arch. Proximal great vessels are patent. Right carotid system: Atherosclerotic calcification proximal right internal carotid artery. Less than 50% diameter stenosis right internal carotid artery. Left carotid system: Atherosclerotic calcification in the carotid bifurcation and carotid bulb. Approximately 50% diameter stenosis left internal carotid artery. Vertebral arteries:Limited visualization of the vertebral arteries. Both vertebral arteries appear to be patent. Skeleton: Thoracic levoscoliosis. No fracture or skeletal mass lesion. Disc degeneration and spondylosis C4-5 and C5-6. Other neck: Patient is intubated. NG tube in the esophagus. No mass lesion in the neck. CTA HEAD Anterior circulation: Atherosclerotic calcification in the cavernous carotid bilaterally with mild stenosis bilaterally. Anterior and middle cerebral arteries are patent bilaterally. Limited vessel detail. Posterior circulation: Both vertebral arteries patent to the basilar. The basilar is patent. Posterior cerebral arteries are patent bilaterally. Limited vessel detail. Venous sinuses: Patent Anatomic variants: None Delayed phase: Not performed. IMPRESSION: There is limited information on the study due to poor opacification of the arterial circulation. Repeat study is suggested. Mild calcific stenosis of the internal carotid artery bilaterally. No large vessel occlusion identified. These results were called by telephone at the time of interpretation on 07/22/2015 at 1:47 pm to Dr. Marvel Plan , who verbally acknowledged these results. Electronically Signed   By: Marlan Palau M.D.   On: 07/22/2015 13:48     STUDIES:  6/18  CT Head >> no acute abnormality  6/19  EEG >> abnormal EEG due to generalized slowing, indicative of encephalopathy 6/19  CTA Head >> limited information  on study due to poor opacification of the arterial circulation, mild calcific stenosis of ICA 6/19  CT Head >> no acute abnormality   CULTURES: MRSA 6/19 (+)   ANTIBIOTICS:  SIGNIFICANT EVENTS:   LINES/TUBES: ETT 6/18 >> 6/19 OGT 6/18 >> Foley 6/18 >>  DISCUSSION: Jay [name] is a 46M with history of poorly controlled diabetes, htn, CAD, who presented to the ED with altered mental status and had an acute episode concerning for stroke and was given tPA. He was markedly hyperglycemic. Post tPA there is concern for GI bleeding.  Extubated 6/19 without difficulty.    ASSESSMENT / PLAN:  PULMONARY A: Acute hypoxemic/hypercapneic resp failure 2/2 unable to protect airway.  Need for intubation for airway protection P:   Pulmonary hygiene post extubation  Mobilize, IS Intermittent CXR   CARDIOVASCULAR A:  Hx HTN - Initial blood pressures 130s-150s systolic P:  Per neuro, maintain BP 140-160 systolic Monitor hemodynamics  RENAL A:   Acute on chronic kidney disease (Cr. 1.62; baseline ~1.2) CKD 3A Hyponatremia/hypoxhloremia Hyperkalemia P:   Trend BMP / UOP  NS @ 4ml/hr  GASTROINTESTINAL A:   Possible GI bleed s/p TPA P:   Protonix  Trend Hgb Clear liquid diet   HEMATOLOGIC A:   S/p tPA Concern for GI bleed - no further bleeding post TPA, OGT output 600 ml after TPA P:  Monitor closely Trend Hgb May need GI consult / evaluation FOBT stool, if positive  will consult GI  INFECTIOUS A:   Leukocytosis likely stress response P:   No abx for now Trend PCT  ENDOCRINE A:   Hyperglycemia / uncontrolled diabetes mellitus - last A1c 12.3 05/23/15 P:   Cont insulin drip.   NEUROLOGIC A:   Concern for acute stroke s/p tPA at 2107 on 07/21/15 CTA cranial > per RN, neuro said CTA was U/R P:   RASS goal: 0 Monitor for s/s of bleeding Follow neuro exam Neurology following Further imaging per Neuro    FAMILY  - Updates:  No family available am 6/20.    -  Inter-disciplinary family meet or Palliative Care meeting due by:  day 7  Transfer to SDU 6/20 and to [facility].   Canary Brim, NP-C [facility] Pgr: [phone] or if no answer [phone] 07/23/2015, 11:04 AM  Attending Note:  70 year old male with AMS who was thought to have a CVA and was given tPA then developed a lower GI bleed that has now subsided.  On exam, patient is confused but following commands.  Not oriented to place or time.  I reviewed CXR myself, no acute disease.  Discussed with PCCM-NP and bedside RN.  Hypoxemia:   - Titrate O2 for sat of 88-92%.  L GI Bleed:  - Stool occult blood.  - No anti-coagulation  - Continue protonix.  - If occult blood is positive will call GI.  - Clear liquid diet.  AMS: residual sedation vs CVA  - Ammonia level.  - Neuro consult.  Disposition:  - Transfer to SDU.  - Transfer care to [facility] with PCCM off 6/21.  DM:  - CBGs  - ISS.  Discussed with TRH-MD.  Patient seen and examined, agree with above note.  I dictated the care and orders written for this patient under my direction.  Alyson Reedy, MD [phone]

## 2015-07-23 NOTE — Progress Notes (Signed)
Pt arrived to unit via bed from 64M with 2 RNs present.  Pt oriented to unit, staff and plan of care.  Vitals and assessment stable, see flowsheet.

## 2015-07-23 NOTE — Care Management Note (Signed)
Case Management Note  Patient Details  Name: TRAVES STOGSDILL MRN: [id number] Date of Birth: [birth year]  Subjective/Objective:           Pt admitted with stroke         Action/Plan:  Unsure of pts disposition at this time - unable to reach wife by phone.  Pt is now extubated - CM ordered PT/OT evaluation.  CM will continue to follow for discharge needs   Expected Discharge Date:                  Expected Discharge Plan:     In-House Referral:     Discharge planning Services  CM Consult  Post Acute Care Choice:    Choice offered to:     DME Arranged:    DME Agency:     HH Arranged:    HH Agency:     Status of Service:  In process, will continue to follow  If discussed at Long Length of Stay Meetings, dates discussed:    Additional Comments:  Cherylann Parr, RN 07/23/2015, 12:10 PM

## 2015-07-23 NOTE — Plan of Care (Signed)
Problem: Education: Goal: Knowledge of disease or condition will improve Outcome: Progressing Discussed benefit of ambulation

## 2015-07-23 NOTE — Clinical Documentation Improvement (Addendum)
  Critical Care  Can the diagnosis of "Acute on Chronic Kidney Disease" be further specified? Thank you   Acute Renal Failure/Acute Kidney Injury  Acute on Chronic Renal Failure  Chronic Renal Failure  Other  Clinically Undetermined  Document any associated diagnoses/conditions. HTN, DM type 2   Supporting Information: (Cr. 1.62; baseline  1.2)   Treatment: IVF @ 50 ml/hr  Please exercise your independent, professional judgment when responding. A specific answer is not anticipated or expected.   Thank You,  Lavonda Jumbo Health Information Management [place] [phone]

## 2015-07-23 NOTE — Progress Notes (Signed)
STROKE TEAM PROGRESS NOTE   SUBJECTIVE (INTERVAL HISTORY) Patient laterally transferred to last night to make room for acute stroke into neuro ICU. This am, patient awake, sitting up in the bed, reaching out to shake Dr. Warren Danes hand. RN at bedside. No family present    OBJECTIVE Temp:  [97.7 F (36.5 C)-99 F (37.2 C)] 99 F (37.2 C) (06/20 0804) Pulse Rate:  [75-119] 84 (06/20 0900) Cardiac Rhythm:  [-] Sinus tachycardia (06/20 0800) Resp:  [0-25] 22 (06/20 0900) BP: (92-143)/(52-104) 115/59 mmHg (06/20 0900) SpO2:  [89 %-100 %] 96 % (06/20 0900) FiO2 (%):  [40 %] 40 % (06/19 1108)  CBC:  Recent Labs Lab 07/21/15 1927  07/22/15 0330  07/22/15 1133 07/22/15 1902  WBC 8.4  < > 15.2*  < > 15.0* 21.0*  NEUTROABS 6.0  --  12.5*  --   --   --   HGB 13.6  < > 11.2*  < > 10.6* 11.2*  HCT 41.1  < > 35.1*  < > 33.2* 34.8*  MCV 86.8  < > 86.7  < > 85.1 86.6  PLT 155  < > 175  < > 157 154  < > = values in this interval not displayed.  Basic Metabolic Panel:   Recent Labs Lab 07/22/15 0112 07/22/15 0330  NA 130* 132*  K 6.5* 5.8*  CL 96* 97*  CO2 25 24  GLUCOSE 529* 540*  BUN 27* 35*  CREATININE 1.54* 1.68*  CALCIUM 8.3* 8.3*    Lipid Panel:     Component Value Date/Time   CHOL 136 07/22/2015 0112   TRIG 123 07/22/2015 0112   TRIG 121 07/22/2015 0112   HDL 53 07/22/2015 0112   CHOLHDL 2.6 07/22/2015 0112   VLDL 25 07/22/2015 0112   LDLCALC 58 07/22/2015 0112   HgbA1c:  Lab Results  Component Value Date   HGBA1C 12.3* 05/23/2015   Urine Drug Screen: No results found for: LABOPIA, COCAINSCRNUR, LABBENZ, AMPHETMU, THCU, LABBARB    IMAGING  Ct Head Wo Contras 07/22/2015   No acute abnormality. 07/21/2015   No acute abnormality. Atrophy.    Dg Chest Port 1 View 07/22/2015  Interval increase in the conspicuity of the cardiac silhouette and pulmonary vascularity suggests mild CHF. There is no significant pleural effusion and no evidence of pneumonia. Developing  atelectasis in the left lower lobe is suspected however.  07/21/2015   Perihilar opacification likely interstitial edema and less likely infection. Mild stable cardiomegaly. Endotracheal tube with tip 2.8 cm above the carina.   Ct Angio Head & Neck W Or Wo Contrast 07/22/2015   There is limited information on the study due to poor opacification of the arterial circulation. Repeat study is suggested. Mild calcific stenosis of the internal carotid artery bilaterally. No large vessel occlusion identified.   MRI pending   EEG This is an abnormal EEG due to generalized slowing, indicative of encephalopathy. This is a nonspecific finding which may be due to toxic-metabolic, infectious, hypoxic, pharmacologic or other diffuse physiologic abnormality.  2D echo pending    PHYSICAL EXAM General - Well nourished, well developed, not in acute distress.  Ophthalmologic - Fundi not visualized.  Cardiovascular - Regular rate and rhythm.  Mental Status -  Level of arousal and orientation to place, and person were intact, but not orientated to time or situation. Language including expression, naming, repetition, comprehension was assessed and found intact.  Cranial Nerves II - XII - II - Visual field intact OU.  III, IV, VI - Extraocular movements intact. V - Facial sensation intact bilaterally. VII - Facial movement intact bilaterally. VIII - Hearing & vestibular intact bilaterally. X - Palate elevates symmetrically. XI - Chin turning & shoulder shrug intact bilaterally. XII - Tongue protrusion intact.  Motor Strength - The patient's strength was normal in all extremities and pronator drift was absent.  Bulk was normal and fasciculations were absent.   Motor Tone - Muscle tone was assessed at the neck and appendages and was normal.  Reflexes - The patient's reflexes were 1+ in all extremities and he had no pathological reflexes.  Sensory - Light touch, temperature/pinprick were assessed and were  symmetrical.    Coordination - The patient had normal movements in the hands and feet with no ataxia or dysmetria.  Tremor was absent.  Gait and Station - deferred.    ASSESSMENT/PLAN Mr. Jay [name] is a 70 y.o. male with history of uncontrolled diabetes mellitus, hypertension hyperlipidemia and coronary artery disease status post stent presenting with altered mental status who developed R  Gaze and R sided weakness and respiratory failure in the ED. He received IV t-PA 07/20/2016 at 2107. He was intubated and admitted to ICU.  AMS: presumed L brain stroke s/p IB tPA vs. Encephalopathy due to hyperglycemia/DKA  Resultant no focal neuro deficit  CTA head and neck no large vessel occlusion although suboptimal study  MRI brain pending  2D Echo  pending   EEG generalized slowing  LDL 58  HgbA1c pending, 12.3 in April  SCDs for VTE prophylaxis Diet clear liquid Room service appropriate?: Yes; Fluid consistency:: Thin  aspirin 81 mg daily prior to admission, now on ASA 81mg .  Ongoing aggressive stroke risk factor management  Therapy recommendations:  pending   Disposition:  pending   Hypertension  Stable 100-120s  Off neo today  Long-term BP goal normotensive  Hyperlipidemia  Home meds:  crestor 10, resumed in hospital  LDL 58, goal < 70  Continue statin at discharge  Diabetes, uncontrolled, with hyperglycemia/DKA/HHS  HgbA1c pending 12.3 in April, goal < 7.0  Off insulin drip   Uncontrolled at home  CBG stable  ? Hx of recent GIB  On protonix drip  Hb stable   No GIB this admission  Resume ASA 81mg   Other Stroke Risk Factors  Advanced age  Obesity, Body mass index is 38.95 kg/(m^2)., recommend weight loss, diet and exercise as appropriate   Coronary artery disease s/p stent 2006 and 2008  Obstructive sleep apnea, not tolerating CPAP  Other Active Problems  Hx recent GIB, bloody output from NG. On protonix drip  Acute on chronic  kidney disease  Hyperkalemia  Hyponatremia   Leukocytosis 15.0->21.0->13.9->15.0->21.0  Hospital day # 1  This patient is critically ill due to presumed stroke s/p tPA, hypotension, questionable for seizure and at significant risk of neurological worsening, death form ICH, recurrent stroke, status epilepticus and heart failure. This patient's care requires constant monitoring of vital signs, hemodynamics, respiratory and cardiac monitoring, review of multiple databases, neurological assessment, discussion with family, other specialists and medical decision making of high complexity. I spent 35 minutes of neurocritical care time in the care of this patient.  Marvel Plan, MD PhD Stroke Neurology 07/23/2015 1:58 PM    To contact Stroke Continuity provider, please refer to [email or web address]. After hours, contact General Neurology

## 2015-07-23 NOTE — Progress Notes (Signed)
Paged [facility]Link regarding pts jump in HR.  No Dr. Present in box- oncoming RN will address with docs this AM.

## 2015-07-23 NOTE — Progress Notes (Signed)
Dr. Craige Cotta notified that patient was able to transfer to level 3 of glycemic control protocol. Orders received. Will monitor.

## 2015-07-24 ENCOUNTER — Inpatient Hospital Stay (HOSPITAL_COMMUNITY): Payer: PPO

## 2015-07-24 DIAGNOSIS — G9340 Encephalopathy, unspecified: Secondary | ICD-10-CM | POA: Diagnosis not present

## 2015-07-24 DIAGNOSIS — I6789 Other cerebrovascular disease: Secondary | ICD-10-CM | POA: Diagnosis not present

## 2015-07-24 DIAGNOSIS — R740 Nonspecific elevation of levels of transaminase and lactic acid dehydrogenase [LDH]: Secondary | ICD-10-CM

## 2015-07-24 DIAGNOSIS — J9690 Respiratory failure, unspecified, unspecified whether with hypoxia or hypercapnia: Secondary | ICD-10-CM | POA: Diagnosis not present

## 2015-07-24 DIAGNOSIS — E1311 Other specified diabetes mellitus with ketoacidosis with coma: Secondary | ICD-10-CM | POA: Diagnosis not present

## 2015-07-24 DIAGNOSIS — R569 Unspecified convulsions: Secondary | ICD-10-CM | POA: Insufficient documentation

## 2015-07-24 DIAGNOSIS — R739 Hyperglycemia, unspecified: Secondary | ICD-10-CM | POA: Diagnosis not present

## 2015-07-24 LAB — ECHOCARDIOGRAM COMPLETE
AV Area VTI index: 0.44 cm2/m2
AV Area mean vel: 0.87 cm2
AV Mean grad: 17 mmHg
AV VEL mean LVOT/AV: 0.25
AV area mean vel ind: 0.38 cm2/m2
AV peak Index: 0.46
AV vel: 1.03
AVA: 1.03 cm2
AVAREAVTI: 1.07 cm2
AVPG: 29 mmHg
AVPKVEL: 268 cm/s
Ao pk vel: 0.31 m/s
DOP CAL AO MEAN VELOCITY: 190 cm/s
EERAT: 16.46
EWDT: 203 ms
FS: 14 % — AB (ref 28–44)
HEIGHTINCHES: 69 in
IV/PV OW: 0.85
LADIAMINDEX: 1.85 cm/m2
LASIZE: 43 mm
LAVOLA4C: 106 mL
LEFT ATRIUM END SYS DIAM: 43 mm
LV PW d: 12.3 mm — AB (ref 0.6–1.1)
LV TDI E'LATERAL: 8.81
LV TDI E'MEDIAL: 6.2
LV e' LATERAL: 8.81 cm/s
LVEEAVG: 16.46
LVEEMED: 16.46
LVOT VTI: 19.7 cm
LVOT area: 3.46 cm2
LVOT peak VTI: 0.3 cm
LVOT peak vel: 82.9 cm/s
LVOTD: 21 mm
LVOTSV: 68 mL
MV Dec: 203
MV pk A vel: 81.5 m/s
MVPG: 8 mmHg
MVPKEVEL: 145 m/s
RV TAPSE: 22.4 mm
VTI: 66 cm
Valve area index: 0.44
Weight: 4208 oz

## 2015-07-24 LAB — BASIC METABOLIC PANEL
Anion gap: 7 (ref 5–15)
BUN: 20 mg/dL (ref 6–20)
CALCIUM: 8.3 mg/dL — AB (ref 8.9–10.3)
CO2: 24 mmol/L (ref 22–32)
CREATININE: 0.93 mg/dL (ref 0.61–1.24)
Chloride: 100 mmol/L — ABNORMAL LOW (ref 101–111)
GFR calc Af Amer: >60 mL/min (ref >60)
GLUCOSE: 221 mg/dL — AB (ref 65–99)
POTASSIUM: 4.2 mmol/L (ref 3.5–5.1)
SODIUM: 131 mmol/L — AB (ref 135–145)

## 2015-07-24 LAB — CBC
HEMATOCRIT: 29.8 % — AB (ref 39.0–52.0)
Hemoglobin: 9.7 g/dL — ABNORMAL LOW (ref 13.0–17.0)
MCH: 27.6 pg (ref 26.0–34.0)
MCHC: 32.6 g/dL (ref 30.0–36.0)
MCV: 84.9 fL (ref 78.0–100.0)
PLATELETS: 172 10*3/uL (ref 150–400)
RBC: 3.51 MIL/uL — ABNORMAL LOW (ref 4.22–5.81)
RDW: 19.8 % — AB (ref 11.5–15.5)
WBC: 10.8 10*3/uL — ABNORMAL HIGH (ref 4.0–10.5)

## 2015-07-24 LAB — GLUCOSE, CAPILLARY
Glucose-Capillary: 225 mg/dL — ABNORMAL HIGH (ref 65–99)
Glucose-Capillary: 253 mg/dL — ABNORMAL HIGH (ref 65–99)
Glucose-Capillary: 285 mg/dL — ABNORMAL HIGH (ref 65–99)
Glucose-Capillary: 288 mg/dL — ABNORMAL HIGH (ref 65–99)
Glucose-Capillary: 324 mg/dL — ABNORMAL HIGH (ref 65–99)

## 2015-07-24 LAB — PROCALCITONIN: Procalcitonin: <0.1 ng/mL

## 2015-07-24 MED ORDER — INSULIN ASPART 100 UNIT/ML ~~LOC~~ SOLN
0.0000 [IU] | Freq: Three times a day (TID) | SUBCUTANEOUS | Status: DC
  Administered 2015-07-24: 15 [IU] via SUBCUTANEOUS
  Administered 2015-07-24: 11 [IU] via SUBCUTANEOUS
  Administered 2015-07-25: 20 [IU] via SUBCUTANEOUS
  Administered 2015-07-25: 7 [IU] via SUBCUTANEOUS
  Administered 2015-07-25: 20 [IU] via SUBCUTANEOUS
  Administered 2015-07-26: 11 [IU] via SUBCUTANEOUS
  Administered 2015-07-26: 7 [IU] via SUBCUTANEOUS
  Administered 2015-07-26: 11 [IU] via SUBCUTANEOUS
  Administered 2015-07-27: 7 [IU] via SUBCUTANEOUS
  Administered 2015-07-27: 11 [IU] via SUBCUTANEOUS

## 2015-07-24 MED ORDER — INSULIN ASPART 100 UNIT/ML ~~LOC~~ SOLN
0.0000 [IU] | Freq: Every day | SUBCUTANEOUS | Status: DC
  Administered 2015-07-24: 3 [IU] via SUBCUTANEOUS
  Administered 2015-07-25 – 2015-07-26 (×2): 4 [IU] via SUBCUTANEOUS

## 2015-07-24 MED ORDER — INSULIN DETEMIR 100 UNIT/ML ~~LOC~~ SOLN
20.0000 [IU] | Freq: Two times a day (BID) | SUBCUTANEOUS | Status: DC
  Administered 2015-07-24 (×2): 20 [IU] via SUBCUTANEOUS
  Filled 2015-07-24 (×5): qty 0.2

## 2015-07-24 MED ORDER — PANTOPRAZOLE SODIUM 40 MG PO TBEC
40.0000 mg | DELAYED_RELEASE_TABLET | Freq: Two times a day (BID) | ORAL | Status: DC
  Administered 2015-07-24 – 2015-07-27 (×7): 40 mg via ORAL
  Filled 2015-07-24 (×7): qty 1

## 2015-07-24 MED ORDER — LACTULOSE 10 GM/15ML PO SOLN
30.0000 g | Freq: Two times a day (BID) | ORAL | Status: DC
  Administered 2015-07-24 (×2): 30 g via ORAL
  Filled 2015-07-24 (×3): qty 45

## 2015-07-24 MED ORDER — INSULIN ASPART 100 UNIT/ML ~~LOC~~ SOLN
6.0000 [IU] | Freq: Three times a day (TID) | SUBCUTANEOUS | Status: DC
  Administered 2015-07-24 – 2015-07-26 (×7): 6 [IU] via SUBCUTANEOUS

## 2015-07-24 MED ORDER — PANTOPRAZOLE SODIUM 40 MG PO TBEC: 40.0000 mg | DELAYED_RELEASE_TABLET | Freq: Every day | ORAL | Status: DC

## 2015-07-24 NOTE — Progress Notes (Signed)
Echocardiogram 2D Echocardiogram has been performed.  Dorothey Baseman 07/24/2015, 1:14 PM

## 2015-07-24 NOTE — [place] FL2 (Signed)
[place] MEDICAID FL2 LEVEL OF CARE SCREENING TOOL     IDENTIFICATION  Patient Name: Jay [name] Birthdate: [birth year] Sex: male Admission Date (Current Location): 07/22/2015  [facility] and [place] Number:  Chiropodist and Address:  [facility] [place], [address] [place], [place] [address]      Provider Number: [id number]  Attending Physician Name and Address:  Lonia Blood, MD  Relative Name and Phone Number:       Current Level of Care: Hospital Recommended Level of Care: Skilled Nursing Facility Prior Approval Number:    Date Approved/Denied:   PASRR Number: [id number]  Discharge Plan: SNF    Current Diagnoses: Patient Active Problem List   Diagnosis Date Noted  . Hyperglycemia   . Uncontrolled type 2 diabetes mellitus with ketoacidotic coma, with long-term current use of insulin (HCC)   . Stroke (HCC) 07/22/2015  . Respiratory failure (HCC)   . Acute encephalopathy   . Unstable angina (HCC) 06/24/2015  . Diabetes mellitus (HCC) 08/20/2014  . Wound infection (HCC) 08/17/2014  . Acute kidney failure (HCC) 05/23/2014  . Actinic keratosis 05/23/2014  . Absolute anemia 05/23/2014  . CAD in native artery 05/23/2014  . Depression, major, recurrent (HCC) 05/23/2014  . DM (diabetes mellitus), secondary (HCC) 05/23/2014  . Diabetic retinopathy (HCC) 05/23/2014  . Abnormal LFTs 05/23/2014  . Fatty infiltration of liver 05/23/2014  . Acid reflux 05/23/2014  . BP (high blood pressure) 05/23/2014  . HLD (hyperlipidemia) 05/23/2014  . Anemia, iron deficiency 05/23/2014  . Metal bone fixation hardware in place 05/23/2014  . Adiposity 05/23/2014  . Obstructive apnea 05/23/2014  . Plaque psoriasis 05/23/2014  . 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 AF (paroxysmal atrial fibrillation) (HCC) 03/01/2014  . Aortic heart valve narrowing 01/05/2014  . Chronic kidney disease (CKD), stage IV (severe) (HCC) 01/05/2014  . Benign essential  HTN 01/05/2014    Orientation RESPIRATION BLADDER Height & Weight     Self, Place  Normal Indwelling catheter Weight: 119.7 kg (263 lb 14.3 oz) Height:   (175.3 cm)  BEHAVIORAL SYMPTOMS/MOOD NEUROLOGICAL BOWEL NUTRITION STATUS      Continent Diet (carb modified)  AMBULATORY STATUS COMMUNICATION OF NEEDS Skin   Limited Assist Verbally Normal                       Personal Care Assistance Level of Assistance  Bathing, Dressing Bathing Assistance: Limited assistance   Dressing Assistance: Limited assistance     Functional Limitations Info             SPECIAL CARE FACTORS FREQUENCY  PT (By licensed PT), OT (By licensed OT)     PT Frequency: 5/wk OT Frequency: 5/wk            Contractures      Additional Factors Info  Code Status, Allergies, Insulin Sliding Scale, Isolation Precautions Code Status Info: FULL Allergies Info: metformin   Insulin Sliding Scale Info: 9/wk Isolation Precautions Info: MRSA     Current Medications (07/24/2015):  This is the current hospital active medication list Current Facility-Administered Medications  Medication Dose Route Frequency Provider Last Rate Last Dose  . 0.9 %  sodium chloride infusion   Intravenous Continuous Lonia Blood, MD 10 mL/hr at 07/24/15 [phone]    . aspirin chewable tablet 81 mg  81 mg Oral Daily Alyson Reedy, MD   81 mg at 07/24/15 1008  . Chlorhexidine Gluconate Cloth 2 %  PADS 6 each  6 each Topical Q0600 Alyson Reedy, MD   6 each at 07/24/15 0600  . insulin aspart (novoLOG) injection 0-20 Units  0-20 Units Subcutaneous TID WC Lonia Blood, MD   15 Units at 07/24/15 1245  . insulin aspart (novoLOG) injection 0-5 Units  0-5 Units Subcutaneous QHS Lonia Blood, MD      . insulin aspart (novoLOG) injection 6 Units  6 Units Subcutaneous TID WC Lonia Blood, MD   6 Units at 07/24/15 1245  . insulin detemir (LEVEMIR) injection 20 Units  20 Units Subcutaneous BID Lonia Blood, MD    20 Units at 07/24/15 1245  . lactulose (CHRONULAC) 10 GM/15ML solution 30 g  30 g Oral BID Lonia Blood, MD   30 g at 07/24/15 1244  . mupirocin ointment (BACTROBAN) 2 % 1 application  1 application Nasal BID Alyson Reedy, MD   1 application at 07/24/15 1009  . ondansetron (ZOFRAN) injection 4 mg  4 mg Intravenous Q6H PRN Marvel Plan, MD      . pantoprazole (PROTONIX) EC tablet 40 mg  40 mg Oral BID Lonia Blood, MD   40 mg at 07/24/15 1008  . tamsulosin (FLOMAX) capsule 0.4 mg  0.4 mg Oral Daily Coralyn Helling, MD   0.4 mg at 07/24/15 1008     Discharge Medications: Please see discharge summary for a list of discharge medications.  Relevant Imaging Results:  Relevant Lab Results:   Additional Information SS#: [id number]  Izora Ribas, [place]

## 2015-07-24 NOTE — Progress Notes (Signed)
Patient arrived from [facility] to 26C21. Safety precautions and orders reviewed with patient and family. TELE applied and confirmed. VSS. No other distress noted. Lunch tray ordered. Will continue to monitor.   Sim Boast, RN

## 2015-07-24 NOTE — Evaluation (Signed)
Physical Therapy Evaluation Patient Details Name: Jay [name] MRN: [id number] DOB: [birth year] Today's Date: 07/24/2015   History of Present Illness  49M who presented to [place] [facility] 6/18 with complaints of altered mental status. Initial evaluation revealed marked hyperglycemia, but during evaluation he reportedly became more altered, had a rightward gaze preference and dropped his sats. He was intubated and his case was discussed with tele-neurology. It is unclear what their assessment or recommendations were, but he was ultimately given tPA out of concern for a brainstem stroke. After administration of tPA he reportedly had about 600cc of bloody OGT output. On arrival here, he is intubated, sedated on propofol and his exam is quite limited. When sedation is turned off, he moves all extremities to noxious stimuli. He does not follow commands. He was later noted to follow some commands from Neuro. In CT scan he became very agitated, moving BUE, so he was sedated. Extubated 6/19 without difficulty.  MRI showed thalamic infarcts, right paracentral pontine infarcts and left temporal lobe infarct.    Clinical Impression  Pt admitted with above diagnosis. Pt currently with functional limitations due to the deficits listed below (see PT Problem List). Pt had difficulty with balance in standing.  Confusion limiting pt as well. Will folllow acutely.  Good rehab candidate.   Pt will benefit from skilled PT to increase their independence and safety with mobility to allow discharge to the venue listed below.      Follow Up Recommendations CIR;Supervision/Assistance - 24 hour    Equipment Recommendations  Other (comment) (TBA)    Recommendations for Other Services Rehab consult     Precautions / Restrictions Precautions Precautions: Fall Restrictions Weight Bearing Restrictions: No      Mobility  Bed Mobility Overal bed mobility: Needs Assistance;+2 for physical assistance Bed Mobility: Supine  to Sit     Supine to sit: Min assist     General bed mobility comments: Pt needed assist for elevation of trunk only.   Transfers Overall transfer level: Needs assistance Equipment used: Rolling walker (2 wheeled) Transfers: Sit to/from [employer] Sit to Stand: Min assist Stand pivot transfers: Mod assist       General transfer comment: Pt stood with just min assist for powering up. Pt took a few pivotal steps to left and lost balance x 1 needing mod assist to recover balance.  Pt unaware of deficits in left LE with notable decr coordination.     Ambulation/Gait Ambulation/Gait assistance: Mod assist;Min assist Ambulation Distance (Feet): 10 Feet Assistive device: Rolling walker (2 wheeled) Gait Pattern/deviations: Step-to pattern;Decreased step length - left;Decreased dorsiflexion - left;Wide base of support;Staggering left   Gait velocity interpretation: Below normal speed for age/gender General Gait Details: Pt needed cues for safety as he did have one significant LOB needing mod assist to recover.  Pt was able to ambulate with RW with max cues to move Left LE and not leave it behind.  Pt needed assist to sequence RW as well. Question whether pt has a left inattention or if it is just left LE uncoordination.   Stairs            Wheelchair Mobility    Modified Rankin (Stroke Patients Only) Modified Rankin (Stroke Patients Only) Pre-Morbid Rankin Score: Slight disability Modified Rankin: Moderately severe disability     Balance Overall balance assessment: Needs assistance;History of Falls Sitting-balance support: No upper extremity supported;Feet supported Sitting balance-Leahy Scale: Fair     Standing balance support: Bilateral upper extremity supported;During  functional activity Standing balance-Leahy Scale: Poor Standing balance comment: relies on UEs for support.                              Pertinent Vitals/Pain Pain  Assessment: No/denies pain  VSS    Home Living Family/patient expects to be discharged to:: Private residence Living Arrangements: Children Available Help at Discharge: Family;Available PRN/intermittently (pt states he and wife lived with son PTA) Type of Home: House Home Access: Stairs to enter Entrance Stairs-Rails: Right;Left;Can reach both Secretary/administrator of Steps: 5 Home Layout: One level Home Equipment: None Additional Comments: Unsure if pt a good historian.  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  Lived with son and son works.  States son put his wife in facility somewhere.      Prior Function Level of Independence: Independent               Hand Dominance        Extremity/Trunk Assessment   Upper Extremity Assessment: Defer to OT evaluation           Lower Extremity Assessment: Generalized weakness;RLE deficits/detail;LLE deficits/detail RLE Deficits / Details: grossly 3/5 LLE Deficits / Details: appears grossly 3/5     Communication   Communication: Expressive difficulties  Cognition Arousal/Alertness: Awake/alert Behavior During Therapy: Impulsive Overall Cognitive Status: No family/caregiver present to determine baseline cognitive functioning Area of Impairment: Orientation;Following commands;Safety/judgement;Problem solving Orientation Level: Disoriented to;Time;Situation (Pt thought it was 1971 and then guessed 2007. )   Memory: Decreased short-term memory Following Commands: Follows one step commands with increased time Safety/Judgement: Decreased awareness of safety;Decreased awareness of deficits   Problem Solving: Difficulty sequencing;Requires verbal cues;Requires tactile cues General Comments: Pt confused.  Pt with poor judgment.  Poor safety awareness.     General Comments      Exercises        Assessment/Plan    PT Assessment Patient needs continued PT services  PT Diagnosis Generalized weakness   PT  Problem List Decreased activity tolerance;Decreased balance;Decreased mobility;Decreased knowledge of use of DME;Decreased safety awareness;Decreased knowledge of precautions;Decreased strength;Decreased coordination;Decreased cognition  PT Treatment Interventions DME instruction;Gait training;Functional mobility training;Therapeutic activities;Therapeutic exercise;Balance training;Cognitive remediation;Neuromuscular re-education;Patient/family education   PT Goals (Current goals can be found in the Care Plan section) Acute Rehab PT Goals Patient Stated Goal: unable due to confusion PT Goal Formulation: Patient unable to participate in goal setting Time For Goal Achievement: 08/07/15 Potential to Achieve Goals: Good    Frequency Min 3X/week   Barriers to discharge Decreased caregiver support (unsure)      Co-evaluation               End of Session Equipment Utilized During Treatment: Gait belt Activity Tolerance: Patient limited by fatigue Patient left: in chair;with call bell/phone within reach;with chair alarm set Nurse Communication: Mobility status         Time: 1000-1025 PT Time Calculation (min) (ACUTE ONLY): 25 min   Charges:   PT Evaluation $PT Eval Moderate Complexity: 1 Procedure PT Treatments $Gait Training: 8-22 mins   PT G CodesTawni Millers F 08/12/15, 11:04 AM Antwann Preziosi,PT Acute Rehabilitation [phone] (pager)

## 2015-07-24 NOTE — Progress Notes (Addendum)
Jay [name] TEAM 1 - Stepdown/ICU TEAM  Jay [name]  CHE:[id number] DOB: 1945-02-21 DOA: 07/22/2015 PCP: Megan Mans, MD    Brief Narrative:  [phone] who presented to [facility] 6/18 with complaints of altered mental status. Initial evaluation revealed marked hyperglycemia, but during evaluation he reportedly became more altered, had a rightward gaze preference and dropped his sats. He was intubated and his case was discussed with tele-neurology. It is unclear what their assessment or recommendations were, but he was ultimately given tPA out of concern for a brainstem stroke. After administration of tPA he reportedly had about 600cc of bloody OGT output. On arrival here, he is intubated, sedated on propofol and his exam is quite limited. When sedation is turned off, he moves all extremities to noxious stimuli. He does not follow commands. He was later noted to follow some commands from Neuro. In CT scan he became very agitated, moving BUE, so he was sedated. Extubated 6/19 without difficulty.   Significant Events: 6/18 tPA given at [facility]  6/19 admit on transfer to [facility]   Subjective: Pt is alert and pleasant, but remains confused.  He denies cp, sob, n/v, or abdom pain.    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 due to hyperglycemia s/p tPA at 2107 on 07/21/15 Neurology following and directing further w/u - no acute CVA noted on MRI   Altered mental status  Ammonia elevated - initiate lactulose and follow - family reports he does have some AMS at baseline - MRI raises ?of possible herpes encephalitis (will await Neuro thoughts)  Acute hypoxemic/hypercapneic resp failure 2/2 inability to protect airway Resolved - sats 96% on RA   Concern for GI bleed No evidence of bleeding at this time - Hgb appears mostly stable - cont to follow - stop PPI gtt   Transaminitis  ?shock liver - check viral hepatitis panel - improving - need to consider cirrhosis or fatty  liver   HTN  BP currently well controlled  Acute on chronic kidney disease Stage 3 Cr. baseline ~1.2 - crt presently better than reported baseline   Hyponatremia Likely pseudo related to uncontrolled DM - follow w/ improving CBG  Hyperkalemia Resolved  Uncontrolled DM A1c 12.3 05/23/15- CBG remains poorly controlled - adjust tx and follow   MRSA screen +  DVT prophylaxis: SCDs Code Status: FULL CODE Family Communication: no family present at time of exam  Disposition Plan: transfer to neuro tele bed   Consultants:  Neurology PCCM  Antimicrobials:  none  Objective: Blood pressure 136/72, pulse 76, temperature 99.4 F (37.4 C), temperature source Oral, resp. rate 21, height 5\' 9"  (1.753 m), weight 119.7 kg (263 lb 14.3 oz), SpO2 96 %.  Intake/Output Summary (Last 24 hours) at 07/24/15 0908 Last data filed at 07/24/15 0900  Gross per 24 hour  Intake   1800 ml  Output   3760 ml  Net  -1960 ml   Filed Weights   07/22/15 0100  Weight: 119.7 kg (263 lb 14.3 oz)    Examination: General: No acute respiratory distress - alert but confused  Lungs: Clear to auscultation bilaterally without wheezes or crackles Cardiovascular: Regular rate and rhythm without gallop or rub normal S1 and S2 - soft systolic M Abdomen: Nontender, nondistended, soft, bowel sounds positive, no rebound, no ascites, no appreciable mass Extremities: No significant cyanosis, clubbing, or edema bilateral lower extremities  CBC:  Recent Labs Lab 07/21/15 1927  07/22/15 0330 07/22/15 0707 07/22/15 1133 07/22/15 1902  07/24/15 0230  WBC 8.4  < > 15.2* 13.9* 15.0* 21.0* 10.8*  NEUTROABS 6.0  --  12.5*  --   --   --   --   HGB 13.6  < > 11.2* 10.7* 10.6* 11.2* 9.7*  HCT 41.1  < > 35.1* 33.9* 33.2* 34.8* 29.8*  MCV 86.8  < > 86.7 85.8 85.1 86.6 84.9  PLT 155  < > 175 143* 157 154 172  < > = values in this interval not displayed. Basic Metabolic Panel:  Recent Labs Lab 07/21/15 1927  07/22/15 0112 07/22/15 0330 07/24/15 0230  NA 130* 130* 132* 131*  K 3.9 6.5* 5.8* 4.2  CL 92* 96* 97* 100*  CO2 26 25 24 24   GLUCOSE 486* 529* 540* 221*  BUN 30* 27* 35* 20  CREATININE 1.62* 1.54* 1.68* 0.93  CALCIUM 8.9 8.3* 8.3* 8.3*   GFR: Estimated Creatinine Clearance: 97.1 mL/min (by C-G formula based on Cr of 0.93).  Liver Function Tests:  Recent Labs Lab 07/21/15 1927 07/22/15 0330  AST 103* 71*  ALT 88* 75*  ALKPHOS 259* 199*  BILITOT 1.1 1.1  PROT 7.2 6.0*  ALBUMIN 3.5 2.8*    Recent Labs Lab 07/21/15 1928 07/23/15 1217  AMMONIA 65* 74*    Coagulation Profile:  Recent Labs Lab 07/22/15 0330  INR 1.29    Cardiac Enzymes:  Recent Labs Lab 07/21/15 1927  TROPONINI 0.03    HbA1C: HEMOGLOBIN A1C  Date/Time Value Ref Range Status  09/04/2014 02:17 PM 12.3  Final   HGB A1C MFR BLD  Date/Time Value Ref Range Status  07/22/2015 01:12 AM 12.9* 4.8 - 5.6 % Final    Comment:    (NOTE)         Pre-diabetes: 5.7 - 6.4         Diabetes: >6.4         Glycemic control for adults with diabetes: <7.0   05/23/2015 04:50 PM 12.3* 4.8 - 5.6 % Final    Comment:             Pre-diabetes: 5.7 - 6.4          Diabetes: >6.4          Glycemic control for adults with diabetes: <7.0     CBG:  Recent Labs Lab 07/23/15 1152 07/23/15 1545 07/23/15 1943 07/23/15 2358 07/24/15 0728  GLUCAP 166* 205* 214* 253* 225*    Recent Results (from the past 240 hour(s))  MRSA PCR Screening     Status: Abnormal   Collection Time: 07/22/15 12:24 AM  Result Value Ref Range Status   MRSA by PCR POSITIVE (A) NEGATIVE Final    Comment:        The GeneXpert MRSA Assay (FDA approved for NASAL specimens only), is one component of a comprehensive MRSA colonization surveillance program. It is not intended to diagnose MRSA infection nor to guide or monitor treatment for MRSA infections. RESULT CALLED TO, READ BACK BY AND VERIFIED WITH: CROFT,S RN [id number] AT 0238  SKEEN,P      Scheduled Meds: . aspirin  81 mg Oral Daily  . Chlorhexidine Gluconate Cloth  6 each Topical Q0600  . insulin aspart  0-15 Units Subcutaneous TID WC  . mupirocin ointment  1 application Nasal BID  . [START ON 07/25/2015] pantoprazole  40 mg Intravenous Q12H  . rosuvastatin  10 mg Oral q1800  . tamsulosin  0.4 mg Oral Daily   Continuous Infusions: . sodium chloride 50 mL/hr at  07/23/15 1806  . pantoprozole (PROTONIX) infusion 8 mg/hr (07/23/15 2248)     LOS: 2 days   Time spent: 35 minutes   Lonia Blood, MD [facility] Office  [phone] Pager - Text Page per Amion as per below:  On-Call/Text Page:      [email or web address]      password [email or web address]  If 7PM-7AM, please contact night-coverage [email or web address] Password [email or web address] 07/24/2015, 9:08 AM

## 2015-07-24 NOTE — Progress Notes (Signed)
Occupational Therapy Evaluation Patient Details Name: Jay [name] MRN: [id number] DOB: 16-Jan-1946 Today's Date: 07/24/2015    History of Present Illness 89M who presented to [facility] 6/18 with complaints of altered mental status. Initial evaluation revealed marked hyperglycemia, but during evaluation he reportedly became more altered, had a rightward gaze preference and dropped his sats. He was intubated and his case was discussed with tele-neurology. He was ultimately given tPA out of concern for a brainstem stroke. After administration of tPA he reportedly had about 600cc of bloody OGT output. On arrival, he is intubated, sedated on propofol. Extubated 6/19 without difficulty.  MRI - for acute infarct.    Clinical Impression   Unsure of PLOF. Pt not oriented to place or time. Pt with apparent cognitive deficits, although unsure of baseline level of functioning. 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 Apparently son has "put my wife somewhere", but pt does not know where. Pt safer ambulating with RW. Feel pt safe to D/C home IF he has 24/7 S with HHOT. Need to confirm PLOF/status with son. If this is a change in mental status and funciton and 24/7 S not available, recommend re[facility] at [facility]. Will continue to follow to address established goals and facilitate safe D/C.     Follow Up Recommendations  Supervision/Assistance - 24 hour;Home health OT vs SNF   Equipment Recommendations  Tub/shower bench;Other (comment) (RW)    Recommendations for Other Services       Precautions / Restrictions Precautions Precautions: Fall Restrictions Weight Bearing Restrictions: No      Mobility Bed Mobility               General bed mobility comments: OOB in chair  Transfers Overall transfer level: Needs assistance Equipment used: Rolling walker (2 wheeled)   Sit to Stand: Min  guard Stand pivot transfers: Min assist            Balance     Sitting balance-Leahy Scale: Good     Standing balance support: During functional activity Standing balance-Leahy Scale: Fair                              ADL Overall ADL's : Needs assistance/impaired     Grooming: Set up;Standing   Upper Body Bathing: Supervision/ safety;Set up;Sitting   Lower Body Bathing: Min guard;Sit to/from stand   Upper Body Dressing : Set up;Supervision/safety;Sitting   Lower Body Dressing: Min guard;Sit to/from stand   Toilet Transfer: Min guard;RW;Comfort height toilet   Toileting- Architect and Hygiene: Min guard;Sit to/from stand       Functional mobility during ADLs: Min guard;Rolling walker;Cueing for safety General ADL Comments: unsteady at times. increased time to process commands during mobility      Vision Additional Comments: unsure if he wears glasses "I should"   Perception     Praxis      Pertinent Vitals/Pain Pain Assessment: No/denies pain     Hand Dominance Right   Extremity/Trunk Assessment Upper Extremity Assessment Upper Extremity Assessment: Overall WFL for tasks assessed   Lower Extremity Assessment Lower Extremity Assessment: Defer to PT evaluation   Cervical / Trunk Assessment Cervical / Trunk Assessment: Normal   Communication Communication Communication: Expressive difficulties (word finding difficulty at times)   Cognition Arousal/Alertness: Awake/alert Behavior During Therapy: Impulsive Overall Cognitive Status: No  family/caregiver present to determine baseline cognitive functioning Area of Impairment: Orientation;Following commands;Safety/judgement;Problem solving;Memory;Awareness Orientation Level: Disoriented to;Time;Situation;Place (Pt thought it was 1978; [facility]; didn't know month )   Memory: Decreased short-term memory Following Commands: Follows one step commands  consistently Safety/Judgement: Decreased awareness of safety;Decreased awareness of deficits Awareness: Emergent Problem Solving: Difficulty sequencing;Requires verbal cues;Slow processing     General Comments       Exercises       Shoulder Instructions      Home Living Family/patient expects to be discharged to:: Private residence Living Arrangements: Children Available Help at Discharge: Family;Available PRN/intermittently (unsure) Type of Home: House Home Access: Stairs to enter [employer] of Steps: 5 Entrance Stairs-Rails: Right;Left;Can reach both Home Layout: One level     Bathroom Shower/Tub: Walk-in shower;Tub/shower unit   Bathroom Toilet: Standard     Home Equipment: None   Additional Comments: Pt gave PT different information about bathroom set up. States he was caregiver for his "bedbound" wife who has people to come in and wash her up. Pt states his son "put her somewhere" but does not know where.       Prior Functioning/Environment Level of Independence: Independent             OT Diagnosis: Generalized weakness;Cognitive deficits   OT Problem List: Decreased activity tolerance;Impaired balance (sitting and/or standing);Decreased cognition;Decreased safety awareness;Obesity   OT Treatment/Interventions: Self-care/ADL training;DME and/or AE instruction;Therapeutic activities;Patient/family education;Balance training    OT Goals(Current goals can be found in the care plan section) Acute Rehab OT Goals Patient Stated Goal: go home OT Goal Formulation: Patient unable to participate in goal setting Time For Goal Achievement: 08/07/15 Potential to Achieve Goals: Good ADL Goals Pt Will Perform Lower Body Bathing: with supervision;sit to/from stand Pt Will Transfer to Toilet: with supervision;ambulating Pt Will Perform Toileting - Clothing Manipulation and hygiene: with modified independence Pt Will Perform Tub/Shower Transfer: with  supervision;ambulating;tub bench;with caregiver independent in assisting;rolling walker  OT Frequency: Min 2X/week   Barriers to D/C:    unsure of caregiver support       Co-evaluation              End of Session Equipment Utilized During Treatment: Gait belt;Rolling walker Nurse Communication: Mobility status  Activity Tolerance: Patient tolerated treatment well Patient left: in chair;with call bell/phone within reach;with chair alarm set   Time: 1610-9604 OT Time Calculation (min): 25 min Charges:  OT General Charges $OT Visit: 1 Procedure OT Evaluation $OT Eval Moderate Complexity: 1 Procedure OT Treatments $Self Care/Home Management : 8-22 mins G-Codes:    Osie Merkin,HILLARY Aug 15, 2015, 4:52 PM   [facility], OTR/L  [phone] 08-15-15

## 2015-07-24 NOTE — Progress Notes (Signed)
STROKE TEAM PROGRESS NOTE   SUBJECTIVE (INTERVAL HISTORY) Patient awake. Talkative. No new complaints. MRI consistent with seizure changes. Glucose improved. Off insulin drip. Transfer out of ICU today.    OBJECTIVE Temp:  [98.3 F (36.8 C)-99.4 F (37.4 C)] 99.4 F (37.4 C) (06/21 0731) Pulse Rate:  [76-116] 76 (06/21 0900) Cardiac Rhythm:  [-] Normal sinus rhythm (06/21 0900) Resp:  [15-23] 21 (06/21 0900) BP: (106-153)/(57-92) 136/72 mmHg (06/21 0900) SpO2:  [90 %-100 %] 96 % (06/21 0900) Weight:  [119.296 kg (263 lb)] 119.296 kg (263 lb) (06/20 1508)  CBC:  Recent Labs Lab 07/21/15 1927  07/22/15 0330  07/22/15 1902 07/24/15 0230  WBC 8.4  < > 15.2*  < > 21.0* 10.8*  NEUTROABS 6.0  --  12.5*  --   --   --   HGB 13.6  < > 11.2*  < > 11.2* 9.7*  HCT 41.1  < > 35.1*  < > 34.8* 29.8*  MCV 86.8  < > 86.7  < > 86.6 84.9  PLT 155  < > 175  < > 154 172  < > = values in this interval not displayed.  Basic Metabolic Panel:   Recent Labs Lab 07/22/15 0330 07/24/15 0230  NA 132* 131*  K 5.8* 4.2  CL 97* 100*  CO2 24 24  GLUCOSE 540* 221*  BUN 35* 20  CREATININE 1.68* 0.93  CALCIUM 8.3* 8.3*    Lipid Panel:     Component Value Date/Time   CHOL 136 07/22/2015 0112   TRIG 123 07/22/2015 0112   TRIG 121 07/22/2015 0112   HDL 53 07/22/2015 0112   CHOLHDL 2.6 07/22/2015 0112   VLDL 25 07/22/2015 0112   LDLCALC 58 07/22/2015 0112   HgbA1c:  Lab Results  Component Value Date   HGBA1C 12.9* 07/22/2015   Urine Drug Screen: No results found for: LABOPIA, COCAINSCRNUR, LABBENZ, AMPHETMU, THCU, LABBARB    IMAGING  Ct Head Wo Contras 07/22/2015   No acute abnormality. 07/21/2015   No acute abnormality. Atrophy.    Dg Chest Port 1 View 07/24/2015   1. Interim removal of endotracheal and NG tubes. 2.  Cardiomegaly.  No evidence of congestive heart failure. 3. Low lung volumes with mild bibasilar atelectasis. 07/22/2015  Interval increase in the conspicuity of the  cardiac silhouette and pulmonary vascularity suggests mild CHF. There is no significant pleural effusion and no evidence of pneumonia. Developing atelectasis in the left lower lobe is suspected however.  07/21/2015   Perihilar opacification likely interstitial edema and less likely infection. Mild stable cardiomegaly. Endotracheal tube with tip 2.8 cm above the carina.   Ct Angio Head & Neck W Or Wo Contrast 07/22/2015   There is limited information on the study due to poor opacification of the arterial circulation. Repeat study is suggested. Mild calcific stenosis of the internal carotid artery bilaterally. No large vessel occlusion identified.   MRI  07/23/2015   Patient was not able to complete the entire examination. Altered signal intensity of medial aspect of the left temporal lobe involving hippocampus and amygdala is a nonspecific finding. Herpes encephalitis needs to be considered and excluded based on clinical results. Seizure activity can cause a similar appearance. Result of metabolic abnormality (elevated or depressed glucose levels) also a consideration. This does not appear to be an acute infarct. Remote very small thalamic infarcts and right paracentral pontine infarct. Mild to moderate global atrophy without hydrocephalus.   EEG This is an abnormal EEG due  to generalized slowing, indicative of encephalopathy. This is a nonspecific finding which may be due to toxic-metabolic, infectious, hypoxic, pharmacologic or other diffuse physiologic abnormality.  2D echo  - Left ventricle: The cavity size was normal. Wall thickness was normal. Systolic function was normal. The estimated ejection fraction was in the range of 55% to 60%. Doppler parameters are consistent with both elevated ventricular end-diastolic filling pressure and elevated left atrial filling pressure. - Aortic valve: Moderately to severely calcified annulus. There was mild stenosis. Valve area (VTI): 1.03 cm^2. Valve area (Vmax):  1.07 cm^2. Valve area (Vmean): 0.87 cm^2. - Mitral valve: Calcified annulus. Mildly thickened leaflets . - Left atrium: The atrium was moderately dilated. - Atrial septum: No defect or patent foramen ovale was identified.   PHYSICAL EXAM General - Well nourished, well developed, not in acute distress.  Ophthalmologic - Fundi not visualized.  Cardiovascular - Regular rate and rhythm.  Mental Status -  Level of arousal and orientation to place, and person were intact, but not orientated to time. Language including expression, naming, repetition, comprehension was assessed and found intact.  Cranial Nerves II - XII - II - Visual field intact OU. III, IV, VI - Extraocular movements intact. V - Facial sensation intact bilaterally. VII - Facial movement intact bilaterally. VIII - Hearing & vestibular intact bilaterally. X - Palate elevates symmetrically. XI - Chin turning & shoulder shrug intact bilaterally. XII - Tongue protrusion intact.  Motor Strength - The patient's strength was normal in all extremities and pronator drift was absent.  Bulk was normal and fasciculations were absent.   Motor Tone - Muscle tone was assessed at the neck and appendages and was normal.  Reflexes - The patient's reflexes were 1+ in all extremities and he had no pathological reflexes.  Sensory - Light touch, temperature/pinprick were assessed and were symmetrical.    Coordination - The patient had normal movements in the hands and feet with no ataxia or dysmetria.  Tremor was absent.  Gait and Station - deferred.    ASSESSMENT/PLAN Mr. Jay [name] is a 70 y.o. male with history of uncontrolled diabetes mellitus, hypertension hyperlipidemia and coronary artery disease status post stent presenting with altered mental status who developed R  Gaze and R sided weakness and respiratory failure in the ED. He received IV t-PA 07/20/2016 at 2107. He was intubated and admitted to ICU.  AMS: likely  seizure due to severe hyperglycemia  On admission presumed L brain stroke s/p IV tPA  Resultant no focal neuro deficit  CTA head and neck no large vessel occlusion although suboptimal study  MRI brain no acute stroke, hyper T2 signal at left mesial temporal lobe consistent with seizure activity. 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 No source of embolus   EEG generalized slowing  LDL 58  HgbA1c 12.9  SCDs for VTE prophylaxis Diet clear liquid Room service appropriate?: Yes; Fluid consistency:: Thin  aspirin 81 mg daily prior to admission, now on ASA . Continue ASA on discharge.  No AED needed at this time.   Ongoing aggressive stroke risk factor management  Therapy recommendations:  pending   Disposition:  pending   Hypertension  Stable 100-120s  Off neo today  Long-term BP goal normotensive  Hyperlipidemia  Home meds:  crestor 10, resumed in hospital  LDL 58, goal < 70  Continue statin at discharge  Diabetes, uncontrolled, with hyperglycemia/DKA/HHS  HgbA1c 12.9, 12.3 in April, goal <  7.0  Off insulin drip   Uncontrolled at home  CBG stable  ? Hx of recent GIB  Off protonix drip  Hb stable   No GIB this admission  Resume ASA   On protonix po  Other Stroke Risk Factors  Advanced age  Obesity, Body mass index is 38.95 kg/(m^2)., recommend weight loss, diet and exercise as appropriate   Coronary artery disease s/p stent 2006 and 2008  Obstructive sleep apnea, not tolerating CPAP  Other Active Problems  Hx recent GIB, bloody output from NG. On protonix drip  Acute on chronic kidney disease  Hyperkalemia  Hyponatremia   Leukocytosis 15.0->21.0->13.9->15.0->21.0    NOTHING FURTHER TO ADD FROM THE STROKE STANDPOINT Stroke Service will sign off. Please call should any needs arise. Neuro Follow-up not indicated   Hospital day # 2  Marvel Plan, MD PhD Stroke  Neurology 07/24/2015 6:18 PM     To contact Stroke Continuity provider, please refer to [email or web address]. After hours, contact General Neurology

## 2015-07-24 NOTE — Clinical Social Work Note (Signed)
Clinical Social Work Assessment  Patient Details  Name: Jay [name] MRN: [id number] Date of Birth: 1945/02/20  Date of referral:  07/24/15               Reason for consult:  Facility Placement, Family Concerns                Permission sought to share information with:  Family Supports Permission granted to share information::  Yes, Verbal Permission Granted  Name::     Hospital doctor::  SNFs  Relationship::  son  Contact Information:     Housing/Transportation Living arrangements for the past 2 months:  Single Family Home Source of Information:  Patient, Adult Children Patient Interpreter Needed:  None Criminal Activity/Legal Involvement Pertinent to Current Situation/Hospitalization:  No - Comment as needed Significant Relationships:  Adult Children, Spouse Lives with:  Adult Children, Spouse Do you feel safe going back to the place where you live?  Yes Need for family participation in patient care:  No (Coment)  Care giving concerns:  Pt lives at home with son who works from 7am-5:30pm and wife who is bedbound at baseline- pt is normally caregiver for wife- pt now with his own physical impairment   Office manager / plan:  CSW spoke with pt about plan for time of DC.  Pt feels confident going home despite deficits and feels as if his son can care for him sufficiently at home.  Pt expresses concerns about his wife and where she is- CSW received call from [facility] CSW, Fredric Mare, who reports pt wife is in the ED at [facility] (brought in by DSS) and they are pursuing placement from ED for pt care while the pt is unable to care for her.  CSW called pt son to discuss- he does not feel care can be provided for pt at home and wants him to go to SNF to get short term rehab prior to return- CSW filled him in on current process for placing pt wife.  Employment status:  Retired Database administrator PT Recommendations:  Skilled Nursing  Facility Information / Referral to community resources:  Skilled Nursing Facility  Patient/Family's Response to care:  Pt was hesitant about SNF and wanted to return home but became agreeable to short term stay to regain strength and avoid rehospitalization.  Patient/Family's Understanding of and Emotional Response to Diagnosis, Current Treatment, and Prognosis:  Unclear understanding of pt comprehension- he is concerned with his decline in function and hopeful he can regain mobility.  Emotional Assessment Appearance:  Appears stated age Attitude/Demeanor/Rapport:    Affect (typically observed):  Appropriate Orientation:  Oriented to Self, Oriented to Place Alcohol / Substance use:  Not Applicable Psych involvement (Current and /or in the community):  No (Comment)  Discharge Needs  Concerns to be addressed:  Discharge Planning Concerns, Care Coordination, Home Safety Concerns Readmission within the last 30 days:  No Current discharge risk:  Physical Impairment Barriers to Discharge:  Continued Medical Work up   Izora Ribas, LCSW 07/24/2015, 4:57 PM

## 2015-07-24 NOTE — Progress Notes (Signed)
Inpatient Rehabilitation  PT is recommending IP Rehab consult.  Per MRI report, pt does not appear to have an acute brain infarct .  With pt's current diagnoses, it is unlikely that pt's [employer] would authorize an IP Rehab admission.  At this time, we are not recommending IP Rehab consult.  Please call if questions.  Weldon Picking PT Inpatient Rehab Admissions Coordinator Cell [phone] Office [phone]

## 2015-07-25 ENCOUNTER — Inpatient Hospital Stay (HOSPITAL_COMMUNITY): Payer: PPO

## 2015-07-25 DIAGNOSIS — E1165 Type 2 diabetes mellitus with hyperglycemia: Secondary | ICD-10-CM

## 2015-07-25 DIAGNOSIS — K760 Fatty (change of) liver, not elsewhere classified: Secondary | ICD-10-CM

## 2015-07-25 DIAGNOSIS — I10 Essential (primary) hypertension: Secondary | ICD-10-CM | POA: Diagnosis not present

## 2015-07-25 DIAGNOSIS — G9340 Encephalopathy, unspecified: Secondary | ICD-10-CM | POA: Diagnosis not present

## 2015-07-25 DIAGNOSIS — E7220 Disorder of urea cycle metabolism, unspecified: Secondary | ICD-10-CM

## 2015-07-25 DIAGNOSIS — K7290 Hepatic failure, unspecified without coma: Secondary | ICD-10-CM | POA: Diagnosis not present

## 2015-07-25 DIAGNOSIS — Z794 Long term (current) use of insulin: Secondary | ICD-10-CM

## 2015-07-25 DIAGNOSIS — K7689 Other specified diseases of liver: Secondary | ICD-10-CM | POA: Diagnosis not present

## 2015-07-25 DIAGNOSIS — R569 Unspecified convulsions: Principal | ICD-10-CM

## 2015-07-25 LAB — GLUCOSE, CAPILLARY
GLUCOSE-CAPILLARY: 233 mg/dL — AB (ref 65–99)
GLUCOSE-CAPILLARY: 389 mg/dL — AB (ref 65–99)
GLUCOSE-CAPILLARY: 363 mg/dL — AB (ref 65–99)
GLUCOSE-CAPILLARY: 339 mg/dL — AB (ref 65–99)
Glucose-Capillary: 365 mg/dL — ABNORMAL HIGH (ref 65–99)

## 2015-07-25 LAB — CBC
HCT: 33.8 % — ABNORMAL LOW (ref 39.0–52.0)
HEMOGLOBIN: 10.9 g/dL — AB (ref 13.0–17.0)
MCH: 27.3 pg (ref 26.0–34.0)
MCHC: 32.2 g/dL (ref 30.0–36.0)
MCV: 84.5 fL (ref 78.0–100.0)
PLATELETS: 176 10*3/uL (ref 150–400)
RBC: 4 MIL/uL — ABNORMAL LOW (ref 4.22–5.81)
RDW: 18.6 % — ABNORMAL HIGH (ref 11.5–15.5)
WBC: 8.3 10*3/uL (ref 4.0–10.5)

## 2015-07-25 LAB — COMPREHENSIVE METABOLIC PANEL
ALBUMIN: 2.8 g/dL — AB (ref 3.5–5.0)
ALK PHOS: 175 U/L — AB (ref 38–126)
ALT: 61 U/L (ref 17–63)
ANION GAP: 7 (ref 5–15)
AST: 68 U/L — ABNORMAL HIGH (ref 15–41)
BILIRUBIN TOTAL: 1 mg/dL (ref 0.3–1.2)
BUN: 18 mg/dL (ref 6–20)
CALCIUM: 8.9 mg/dL (ref 8.9–10.3)
CO2: 26 mmol/L (ref 22–32)
CREATININE: 1.06 mg/dL (ref 0.61–1.24)
Chloride: 99 mmol/L — ABNORMAL LOW (ref 101–111)
GFR calc non Af Amer: >60 mL/min (ref >60)
GLUCOSE: 213 mg/dL — AB (ref 65–99)
Potassium: 3.9 mmol/L (ref 3.5–5.1)
Sodium: 132 mmol/L — ABNORMAL LOW (ref 135–145)
TOTAL PROTEIN: 6.4 g/dL — AB (ref 6.5–8.1)

## 2015-07-25 LAB — URINALYSIS, ROUTINE W REFLEX MICROSCOPIC
BILIRUBIN URINE: NEGATIVE
Glucose, UA: 500 mg/dL — AB
KETONES UR: NEGATIVE mg/dL
Leukocytes, UA: NEGATIVE
NITRITE: NEGATIVE
PH: 6 (ref 5.0–8.0)
Protein, ur: NEGATIVE mg/dL
SPECIFIC GRAVITY, URINE: 1.014 (ref 1.005–1.030)

## 2015-07-25 LAB — URINE MICROSCOPIC-ADD ON: Squamous Epithelial / LPF: NONE SEEN

## 2015-07-25 LAB — AMMONIA: Ammonia: 50 umol/L — ABNORMAL HIGH (ref 9–35)

## 2015-07-25 MED ORDER — LACTULOSE 10 GM/15ML PO SOLN
30.0000 g | Freq: Three times a day (TID) | ORAL | Status: DC
  Administered 2015-07-25 – 2015-07-26 (×3): 30 g via ORAL
  Filled 2015-07-25 (×3): qty 45

## 2015-07-25 MED ORDER — INSULIN DETEMIR 100 UNIT/ML ~~LOC~~ SOLN
30.0000 [IU] | Freq: Two times a day (BID) | SUBCUTANEOUS | Status: DC
  Administered 2015-07-25 – 2015-07-26 (×3): 30 [IU] via SUBCUTANEOUS
  Filled 2015-07-25 (×4): qty 0.3

## 2015-07-25 NOTE — Progress Notes (Signed)
Urine specimen obtained for UA, C&S per MD order. Foley catheter removed after. Pt tol well. Specimen sent to lab. Pt needs to void before 4pm.  Also, pt for Abd U/S today. Placed on NPO until 2pm, then will need to call ultrasound to schedule. [facility]

## 2015-07-25 NOTE — Progress Notes (Addendum)
PROGRESS NOTE  Jay [name]:[id number] DOB: 02-Dec-1945 DOA: 07/22/2015 PCP: Megan Mans, MD  Brief History:  69y/o male with hx of DM2, HTN, HLD, CAD s/p stent who presented to [facility] 6/18 with complaints of altered mental status. Initial evaluation revealed marked hyperglycemia, but during evaluation he reportedly became more altered, had a rightward gaze preference and dropped his sats. He was intubated and his case was discussed with tele-neurology. It is unclear what their assessment or recommendations were, but he was ultimately given tPA out of concern for a brainstem stroke. After administration of tPA he reportedly had about 600cc of bloody OGT output. On arrival here, he is intubated, sedated on propofol and his exam is quite limited. When sedation is turned off, he moves all extremities to noxious stimuli. He did not follow commands. He was later noted to follow some commands from Neuro. In CT scan he became very agitated, moving BUE, so he was sedated. Extubated 6/19 without difficulty.  Previous records reviewed and summarized.  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 -07/22/2015 EEG generalized slowing indicative of encephalopathy -Neurology was consulted--felt patient likely had seizure secondary to severe hyperglycemia -CT angiogram head and neck--suboptimal although no  large vessel occlusion noted -Echocardiogram--EF 55-50 percent, no emboli source -LDL 58 -Hemoglobin A1c--12.9 -Continue aspirin 81 mg daily -no AEDs at this time per neurology  Hyperammonemia -RUQ [place] to eval liver architecture -increase lactulose to tid -recheck ammonia in am  Acute hypoxemic/hypercapneic resp failure 2/2 inability to protect airway Resolved - sats 96% on RA   Concern for GI bleed No evidence of bleeding at this time - Hgb appears mostly stable - cont to follow - stop PPI gtt  -continue po protonix -baseline Hgb 9-10  Transaminitis  ?shock liver  -previous abd US revealed steatosis -hepatitis b surface antigen -hep c antibody -repeat abd [place] to eval liver architecture  HTN  BP currently well controlled  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 baseline ~1.0-1.3  -serum creatinine peaked at 1.68 -resolved  Hyponatremia Likely pseudohyponatremia with suspicion of liver cirrhosis --overall Na stable--low 130s  Hyperkalemia Resolved  Uncontrolled DM2 A1c 12.3 05/23/15- CBG remains poorly controlled -increase Levemir to 30 units bid -continue ISS -hold amaryl and tradjenta       Disposition Plan:   SNF in 1-2 days  Family Communication:   No Family at bedside--total time 40 min; >50% spent counseling and coordinating care   Consultants:  Neurology, CCM  Code Status:  FULL  DVT Prophylaxis:  SCD   Procedures: As Listed in Progress Note Above  Antibiotics: None    Subjective: Patient denies fevers, chills, headache, chest pain, dyspnea, nausea, vomiting, diarrhea, abdominal pain, dysuria, hematuria   Objective: Filed Vitals:   07/24/15 1729 07/24/15 2202 07/25/15 0209 07/25/15 0559  BP: 131/70 122/70 143/77 126/70  Pulse: 90 91 95 93  Temp: 98.2 F (36.8 C)  98.4  F (36.9 C) 98.4 F (36.9 C)  TempSrc: Oral Oral Oral Oral  Resp: 18 18 18 18   Height:      Weight:      SpO2: 98% 100% 98% 97%    Intake/Output  Summary (Last 24 hours) at 07/25/15 0648 Last data filed at 07/25/15 0443  Gross per 24 hour  Intake 1384.59 ml  Output   3600 ml  Net -2215.41 ml   Weight change:  Exam:   General:  Pt is alert, follows commands appropriately, not in acute distress  HEENT: No icterus, No thrush, No neck mass, [place]/AT  Cardiovascular: RRR, S1/S2, no rubs, no gallops  Respiratory: CTA bilaterally, no wheezing, no crackles, no rhonchi  Abdomen: Soft/+BS, non tender, non distended, no guarding  Extremities: No edema, No lymphangitis, No petechiae, No rashes, no synovitis   Data Reviewed: I have personally reviewed following labs and imaging studies Basic Metabolic Panel:  Recent Labs Lab 07/21/15 1927 07/22/15 0112 07/22/15 0330 07/24/15 0230  NA 130* 130* 132* 131*  K 3.9 6.5* 5.8* 4.2  CL 92* 96* 97* 100*  CO2 26 25 24 24   GLUCOSE 486* 529* 540* 221*  BUN 30* 27* 35* 20  CREATININE 1.62* 1.54* 1.68* 0.93  CALCIUM 8.9 8.3* 8.3* 8.3*   Liver Function Tests:  Recent Labs Lab 07/21/15 1927 07/22/15 0330  AST 103* 71*  ALT 88* 75*  ALKPHOS 259* 199*  BILITOT 1.1 1.1  PROT 7.2 6.0*  ALBUMIN 3.5 2.8*   No results for input(s): LIPASE, AMYLASE in the last 168 hours.  Recent Labs Lab 07/21/15 1928 07/23/15 1217  AMMONIA 65* 74*   Coagulation Profile:  Recent Labs Lab 07/22/15 0330  INR 1.29   CBC:  Recent Labs Lab 07/21/15 1927  07/22/15 0330 07/22/15 0707 07/22/15 1133 07/22/15 1902 07/24/15 0230  WBC 8.4  < > 15.2* 13.9* 15.0* 21.0* 10.8*  NEUTROABS 6.0  --  12.5*  --   --   --   --   HGB 13.6  < > 11.2* 10.7* 10.6* 11.2* 9.7*  HCT 41.1  < > 35.1* 33.9* 33.2* 34.8* 29.8*  MCV 86.8  < > 86.7 85.8 85.1 86.6 84.9  PLT 155  < > 175 143* 157 154 172  < > = values in this interval not displayed. Cardiac Enzymes:  Recent Labs Lab 07/21/15 1927  TROPONINI 0.03   BNP: Invalid input(s): POCBNP CBG:  Recent Labs Lab 07/24/15 0728 07/24/15 1110  07/24/15 1725 07/24/15 2124 07/25/15 0622  GLUCAP 225* 324* 288* 285* 233*   HbA1C: No results for input(s): HGBA1C in the last 72 hours. Urine analysis:    Component Value Date/Time   COLORURINE STRAW* 07/21/2015 1952   COLORURINE YELLOW 12/31/2013 1558   APPEARANCEUR CLEAR* 07/21/2015 1952   APPEARANCEUR HAZY 12/31/2013 1558   LABSPEC 1.018 07/21/2015 1952   LABSPEC 1.010 12/31/2013 1558   PHURINE 6.0 07/21/2015 1952   PHURINE 5.0 12/31/2013 1558   GLUCOSEU >500* 07/21/2015 1952   GLUCOSEU 100 mg/dL 16/11/9602 5409   HGBUR NEGATIVE 07/21/2015 1952   HGBUR NEGATIVE 12/31/2013 1558   BILIRUBINUR NEGATIVE 07/21/2015 1952   BILIRUBINUR NEGATIVE 12/31/2013 1558   KETONESUR NEGATIVE 07/21/2015 1952   KETONESUR NEGATIVE 12/31/2013 1558   PROTEINUR NEGATIVE 07/21/2015 1952   PROTEINUR NEGATIVE 12/31/2013 1558   NITRITE NEGATIVE 07/21/2015 1952   NITRITE NEGATIVE 12/31/2013 1558   LEUKOCYTESUR NEGATIVE 07/21/2015 1952   LEUKOCYTESUR NEGATIVE 12/31/2013 1558   Sepsis Labs: @LABRCNTIP (procalcitonin:4,lacticidven:4) ) Recent Results (  from the past 240 hour(s))  MRSA PCR Screening     Status: Abnormal   Collection Time: 07/22/15 12:24 AM  Result Value Ref Range Status   MRSA by PCR POSITIVE (A) NEGATIVE Final    Comment:        The GeneXpert MRSA Assay (FDA approved for NASAL specimens only), is one component of a comprehensive MRSA colonization surveillance program. It is not intended to diagnose MRSA infection nor to guide or monitor treatment for MRSA infections. RESULT CALLED TO, READ BACK BY AND VERIFIED WITH: CROFT,S RN [id number] AT 0238 SKEEN,P      Scheduled Meds: . aspirin  81 mg Oral Daily  . Chlorhexidine Gluconate Cloth  6 each Topical Q0600  . insulin aspart  0-20 Units Subcutaneous TID WC  . insulin aspart  0-5 Units Subcutaneous QHS  . insulin aspart  6 Units Subcutaneous TID WC  . insulin detemir  20 Units Subcutaneous BID  . lactulose  30 g Oral BID   . mupirocin ointment  1 application Nasal BID  . pantoprazole  40 mg Oral BID  . tamsulosin  0.4 mg Oral Daily   Continuous Infusions: . sodium chloride 10 mL/hr at 07/24/15 0454    Procedures/Studies: Ct Angio Head W Or Wo Contrast  07/22/2015  CLINICAL DATA:  Altered mental status. Diabetes and hypertension. Right-sided weakness. EXAM: CT ANGIOGRAPHY HEAD AND NECK TECHNIQUE: Multidetector CT imaging of the head and neck was performed using the standard protocol during bolus administration of intravenous contrast. Multiplanar CT image reconstructions and MIPs were obtained to evaluate the vascular anatomy. Carotid stenosis measurements (when applicable) are obtained utilizing NASCET criteria, using the distal internal carotid diameter as the denominator. CONTRAST:  50 mL Isovue 370 IV. Note that some of the contrast leaked out of the IV connection toward the end of the study. There is limited opacification of the arteries in the neck and head and the study is marginally diagnostic. Repeat study recommended when the patient is able to receive additional contrast. GFR is 40. COMPARISON:  CT head 07/21/2015 FINDINGS: Limited opacification of the arterial circulation. Repeat study recommended. CTA NECK Aortic arch: Mild atherosclerotic calcification aortic arch. Proximal great vessels are patent. Right carotid system: Atherosclerotic calcification proximal right internal carotid artery. Less than 50% diameter stenosis right internal carotid artery. Left carotid system: Atherosclerotic calcification in the carotid bifurcation and carotid bulb. Approximately 50% diameter stenosis left internal carotid artery. Vertebral arteries:Limited visualization of the vertebral arteries. Both vertebral arteries appear to be patent. Skeleton: Thoracic levoscoliosis. No fracture or skeletal mass lesion. Disc degeneration and spondylosis C4-5 and C5-6. Other neck: Patient is intubated. NG tube in the esophagus. No mass  lesion in the neck. CTA HEAD Anterior circulation: Atherosclerotic calcification in the cavernous carotid bilaterally with mild stenosis bilaterally. Anterior and middle cerebral arteries are patent bilaterally. Limited vessel detail. Posterior circulation: Both vertebral arteries patent to the basilar. The basilar is patent. Posterior cerebral arteries are patent bilaterally. Limited vessel detail. Venous sinuses: Patent Anatomic variants: None Delayed phase: Not performed. IMPRESSION: There is limited information on the study due to poor opacification of the arterial circulation. Repeat study is suggested. Mild calcific stenosis of the internal carotid artery bilaterally. No large vessel occlusion identified. These results were called by telephone at the time of interpretation on 07/22/2015 at 1:47 pm to Dr. Marvel Plan , who verbally acknowledged these results. Electronically Signed   By: Marlan Palau M.D.   On: 07/22/2015 13:48   Dg  Chest 1 View  07/21/2015  CLINICAL DATA:  Endotracheal tube and orogastric tube placement. Initial encounter. EXAM: CHEST 1 VIEW COMPARISON:  Chest radiograph performed earlier today at 9:06 p.m. FINDINGS: The patient's endotracheal tube is seen ending 3 cm above the carina. An enteric tube is noted extending below the diaphragm. The lungs are hypoexpanded. Vascular crowding and vascular congestion are noted. No pleural effusion or pneumothorax is seen. The cardiomediastinal silhouette is borderline normal in size. Scattered coronary artery calcifications are noted. No acute osseous abnormalities are identified. An external pacing pad is noted. IMPRESSION: 1. Endotracheal tube seen ending 3 cm above the carina. 2. Enteric tube noted extending below the diaphragm. 3. Lungs hypoexpanded, with vascular congestion. No focal airspace consolidation seen. 4. Scattered coronary artery calcifications seen. Electronically Signed   By: Roanna Raider M.D.   On: 07/21/2015 23:53   Dg Abd 1  View  07/21/2015  CLINICAL DATA:  Orogastric tube placement.  Initial encounter. EXAM: ABDOMEN - 1 VIEW COMPARISON:  None. FINDINGS: An enteric tube is noted coiling within the stomach. The visualized bowel gas pattern is unremarkable. Scattered air and stool filled loops of colon are seen; no abnormal dilatation of small bowel loops is seen to suggest small bowel obstruction. No free intra-abdominal air is identified, though evaluation for free air is limited on a single supine view. The visualized osseous structures are within normal limits; the sacroiliac joints are unremarkable in appearance. The visualized lung bases are essentially clear. External pacing pads are noted. IMPRESSION: Enteric tube noted coiling within the stomach. Electronically Signed   By: Roanna Raider M.D.   On: 07/21/2015 23:54   Ct Head Wo Contrast  07/22/2015  CLINICAL DATA:  Stroke EXAM: CT HEAD WITHOUT CONTRAST TECHNIQUE: Contiguous axial images were obtained from the base of the skull through the vertex without intravenous contrast. COMPARISON:  CT head 07/21/2015 FINDINGS: Generalized atrophy.  Negative for hydrocephalus. Negative for acute infarct.  Negative for hemorrhage or mass Atherosclerotic calcification. Negative calvarium. IMPRESSION: No acute abnormality. Electronically Signed   By: Marlan Palau M.D.   On: 07/22/2015 21:03   Ct Head Wo Contrast  07/21/2015  CLINICAL DATA:  Elevated blood sugar. Altered mental status today. Initial encounter. EXAM: CT HEAD WITHOUT CONTRAST TECHNIQUE: Contiguous axial images were obtained from the base of the skull through the vertex without intravenous contrast. COMPARISON:  Head CT scan 12/30/2013. FINDINGS: Cortical atrophy is again seen. No evidence of acute intracranial abnormality including hemorrhage, infarct, mass lesion, mass effect, midline shift or abnormal extra-axial fluid collection. No hydrocephalus or pneumocephalus. The calvarium is intact. Imaged paranasal sinuses  and mastoid air cells are clear. IMPRESSION: No acute abnormality. Atrophy. Electronically Signed   By: Drusilla Kanner M.D.   On: 07/21/2015 19:44   Ct Angio Neck W Or Wo Contrast  07/22/2015  CLINICAL DATA:  Altered mental status. Diabetes and hypertension. Right-sided weakness. EXAM: CT ANGIOGRAPHY HEAD AND NECK TECHNIQUE: Multidetector CT imaging of the head and neck was performed using the standard protocol during bolus administration of intravenous contrast. Multiplanar CT image reconstructions and MIPs were obtained to evaluate the vascular anatomy. Carotid stenosis measurements (when applicable) are obtained utilizing NASCET criteria, using the distal internal carotid diameter as the denominator. CONTRAST:  50 mL Isovue 370 IV. Note that some of the contrast leaked out of the IV connection toward the end of the study. There is limited opacification of the arteries in the neck and head and the study is marginally diagnostic.  Repeat study recommended when the patient is able to receive additional contrast. GFR is 40. COMPARISON:  CT head 07/21/2015 FINDINGS: Limited opacification of the arterial circulation. Repeat study recommended. CTA NECK Aortic arch: Mild atherosclerotic calcification aortic arch. Proximal great vessels are patent. Right carotid system: Atherosclerotic calcification proximal right internal carotid artery. Less than 50% diameter stenosis right internal carotid artery. Left carotid system: Atherosclerotic calcification in the carotid bifurcation and carotid bulb. Approximately 50% diameter stenosis left internal carotid artery. Vertebral arteries:Limited visualization of the vertebral arteries. Both vertebral arteries appear to be patent. Skeleton: Thoracic levoscoliosis. No fracture or skeletal mass lesion. Disc degeneration and spondylosis C4-5 and C5-6. Other neck: Patient is intubated. NG tube in the esophagus. No mass lesion in the neck. CTA HEAD Anterior circulation:  Atherosclerotic calcification in the cavernous carotid bilaterally with mild stenosis bilaterally. Anterior and middle cerebral arteries are patent bilaterally. Limited vessel detail. Posterior circulation: Both vertebral arteries patent to the basilar. The basilar is patent. Posterior cerebral arteries are patent bilaterally. Limited vessel detail. Venous sinuses: Patent Anatomic variants: None Delayed phase: Not performed. IMPRESSION: There is limited information on the study due to poor opacification of the arterial circulation. Repeat study is suggested. Mild calcific stenosis of the internal carotid artery bilaterally. No large vessel occlusion identified. These results were called by telephone at the time of interpretation on 07/22/2015 at 1:47 pm to Dr. Marvel Plan , who verbally acknowledged these results. Electronically Signed   By: Marlan Palau M.D.   On: 07/22/2015 13:48   Mr Brain Wo Contrast  07/23/2015  CLINICAL DATA:  70 year old diabetic hypertensive male with altered mental status. Initial evaluation revealed hyperglycemia. Subsequent encounter. EXAM: MRI HEAD WITHOUT CONTRAST TECHNIQUE: Multiplanar, multiecho pulse sequences of the brain and surrounding structures were obtained without intravenous contrast. COMPARISON:  07/22/2015 head CT and CT angiogram. FINDINGS: Patient was not able to complete the entire examination. Altered signal intensity of medial aspect of the left temporal lobe involving hippocampus and amygdala is a nonspecific finding. Herpes encephalitis needs to be considered and excluded based on clinical results. Seizure activity can cause a similar appearance. Result of metabolic abnormality (elevated or depressed glucose levels) also a consideration. This does not appear be an acute infarct. Remote very small thalamic infarcts and right paracentral pontine infarct. Mild chronic nonspecific white matter changes. Mild to moderate global atrophy without hydrocephalus. No  intracranial hemorrhage. No intracranial mass lesion noted on this unenhanced exam. Major intracranial vascular structures are patent. Mild mucosal thickening ethmoid sinus air cells. Post lens replacement without acute abnormality noted. IMPRESSION: Patient was not able to complete the entire examination. Altered signal intensity of medial aspect of the left temporal lobe involving hippocampus and amygdala is a nonspecific finding. Herpes encephalitis needs to be considered and excluded based on clinical results. Seizure activity can cause a similar appearance. Result of metabolic abnormality (elevated or depressed glucose levels) also a consideration. This does not appear to be an acute infarct. Remote very small thalamic infarcts and right paracentral pontine infarct. Mild to moderate global atrophy without hydrocephalus. These results were called by telephone at the time of interpretation on 07/23/2015 at 4:00 pm to Dr. Marvel Plan , who verbally acknowledged these results. Electronically Signed   By: Lacy Duverney M.D.   On: 07/23/2015 16:04   Dg Chest Port 1 View  07/24/2015  CLINICAL DATA:  Respiratory failure. EXAM: PORTABLE CHEST 1 VIEW COMPARISON:  07/22/2015. FINDINGS: Interim extubation and removal of NG tube. Cardiomegaly with  normal pulmonary vascularity . Low lung volumes with mild bibasilar atelectasis. No pleural effusion or pneumothorax. IMPRESSION: 1. Interim removal of endotracheal and NG tubes. 2.  Cardiomegaly.  No evidence of congestive heart failure. 3. Low lung volumes with mild bibasilar atelectasis. Electronically Signed   By: Maisie Fus  Register   On: 07/24/2015 07:02   Dg Chest Port 1 View  07/22/2015  CLINICAL DATA:  Status post CVA, acute renal insufficiency, coronary artery disease, diabetes. EXAM: PORTABLE CHEST 1 VIEW COMPARISON:  Portable chest x-ray of July 21, 2015 FINDINGS: The lungs are mildly hypoinflated. The retrocardiac region on the left is more dense today. The cardiac  silhouette is larger today. The central pulmonary vascularity is prominent. The endotracheal tube tip lies approximately 3.8 cm above the carina. IMPRESSION: Interval increase in the conspicuity of the cardiac silhouette and pulmonary vascularity suggests mild CHF. There is no significant pleural effusion and no evidence of pneumonia. Developing atelectasis in the left lower lobe is suspected however. Electronically Signed   By: Vanshika Jastrzebski  [place] M.D.   On: 07/22/2015 07:31   Dg Chest Portable 1 View  07/21/2015  CLINICAL DATA:  Post intubation. EXAM: PORTABLE CHEST 1 VIEW COMPARISON:  05/29/2015 FINDINGS: Endotracheal tube has tip 2.8 cm above the carina. Lungs are hypoinflated with bilateral perihilar opacification likely mild interstitial edema and less likely infection. No evidence of effusion or pneumothorax. Mild stable cardiomegaly. Coronary stent over the left heart. Remainder of the exam is unchanged. IMPRESSION: Perihilar opacification likely interstitial edema and less likely infection. Mild stable cardiomegaly. Endotracheal tube with tip 2.8 cm above the carina. Electronically Signed   By: Elberta Fortis M.D.   On: 07/21/2015 21:26    Veleda Mun, DO  [facility] Pager [phone]  If 7PM-7AM, please contact night-coverage [email or web address] Password [facility] 07/25/2015, 6:48 AM   LOS: 3 days

## 2015-07-25 NOTE — Progress Notes (Addendum)
Update: [facility] is not in network with [facility]. CSW following up with [facility] to see if they can take patient and his wife (at [facility]).   CSW updated patient's son regarding SNF options. Patient's son would like patient to discharge to [facility] healthcare.  Osborne Casco [employer]s LCSWA [phone]

## 2015-07-26 DIAGNOSIS — G9340 Encephalopathy, unspecified: Secondary | ICD-10-CM | POA: Diagnosis not present

## 2015-07-26 DIAGNOSIS — K7290 Hepatic failure, unspecified without coma: Secondary | ICD-10-CM

## 2015-07-26 DIAGNOSIS — E1311 Other specified diabetes mellitus with ketoacidosis with coma: Secondary | ICD-10-CM

## 2015-07-26 DIAGNOSIS — K7682 Hepatic encephalopathy: Secondary | ICD-10-CM

## 2015-07-26 DIAGNOSIS — E7220 Disorder of urea cycle metabolism, unspecified: Secondary | ICD-10-CM | POA: Diagnosis not present

## 2015-07-26 DIAGNOSIS — E1165 Type 2 diabetes mellitus with hyperglycemia: Secondary | ICD-10-CM | POA: Diagnosis not present

## 2015-07-26 DIAGNOSIS — I10 Essential (primary) hypertension: Secondary | ICD-10-CM | POA: Diagnosis not present

## 2015-07-26 DIAGNOSIS — K760 Fatty (change of) liver, not elsewhere classified: Secondary | ICD-10-CM | POA: Diagnosis not present

## 2015-07-26 LAB — GLUCOSE, CAPILLARY
GLUCOSE-CAPILLARY: 223 mg/dL — AB (ref 65–99)
GLUCOSE-CAPILLARY: 286 mg/dL — AB (ref 65–99)
Glucose-Capillary: 173 mg/dL — ABNORMAL HIGH (ref 65–99)
Glucose-Capillary: 262 mg/dL — ABNORMAL HIGH (ref 65–99)
Glucose-Capillary: 307 mg/dL — ABNORMAL HIGH (ref 65–99)

## 2015-07-26 LAB — AMMONIA: AMMONIA: 58 umol/L — AB (ref 9–35)

## 2015-07-26 LAB — COMPREHENSIVE METABOLIC PANEL
ALBUMIN: 2.5 g/dL — AB (ref 3.5–5.0)
ALT: 66 U/L — AB (ref 17–63)
AST: 71 U/L — AB (ref 15–41)
Alkaline Phosphatase: 182 U/L — ABNORMAL HIGH (ref 38–126)
Anion gap: 5 (ref 5–15)
BUN: 17 mg/dL (ref 6–20)
CHLORIDE: 99 mmol/L — AB (ref 101–111)
CO2: 26 mmol/L (ref 22–32)
CREATININE: 0.98 mg/dL (ref 0.61–1.24)
Calcium: 8.6 mg/dL — ABNORMAL LOW (ref 8.9–10.3)
GFR calc Af Amer: >60 mL/min (ref >60)
GLUCOSE: 244 mg/dL — AB (ref 65–99)
POTASSIUM: 4 mmol/L (ref 3.5–5.1)
SODIUM: 130 mmol/L — AB (ref 135–145)
Total Bilirubin: 0.7 mg/dL (ref 0.3–1.2)
Total Protein: 5.9 g/dL — ABNORMAL LOW (ref 6.5–8.1)

## 2015-07-26 LAB — HEPATITIS PANEL, ACUTE
HCV Ab: <0.1 s/co ratio (ref 0.0–0.9)
HEP B C IGM: NEGATIVE
HEP B S AG: NEGATIVE
Hep A IgM: NEGATIVE

## 2015-07-26 LAB — URINE CULTURE: CULTURE: NO GROWTH

## 2015-07-26 MED ORDER — LACTULOSE 10 GM/15ML PO SOLN
45.0000 g | Freq: Three times a day (TID) | ORAL | Status: DC
  Administered 2015-07-26 – 2015-07-27 (×2): 45 g via ORAL
  Filled 2015-07-26 (×6): qty 90

## 2015-07-26 MED ORDER — INSULIN DETEMIR 100 UNIT/ML ~~LOC~~ SOLN
35.0000 [IU] | Freq: Two times a day (BID) | SUBCUTANEOUS | Status: DC
  Filled 2015-07-26: qty 0.35

## 2015-07-26 MED ORDER — INSULIN DETEMIR 100 UNIT/ML ~~LOC~~ SOLN
40.0000 [IU] | Freq: Two times a day (BID) | SUBCUTANEOUS | Status: DC
  Administered 2015-07-26 – 2015-07-27 (×2): 40 [IU] via SUBCUTANEOUS
  Filled 2015-07-26 (×4): qty 0.4

## 2015-07-26 MED ORDER — INSULIN ASPART 100 UNIT/ML ~~LOC~~ SOLN
8.0000 [IU] | Freq: Three times a day (TID) | SUBCUTANEOUS | Status: DC
  Administered 2015-07-26 – 2015-07-27 (×3): 8 [IU] via SUBCUTANEOUS

## 2015-07-26 NOTE — Progress Notes (Signed)
PROGRESS NOTE  Jay [name] JOA:[id number] DOB: 05-30-1945 DOA: 07/22/2015 PCP: Megan Mans, MD  Brief History:  69y/o male with hx of DM2, HTN, HLD, CAD s/p stent who presented to [facility] 6/18 with complaints of altered mental status. Initial evaluation revealed marked hyperglycemia, but during evaluation he reportedly became more altered, had a rightward gaze preference and dropped his sats. He was intubated and his case was discussed with tele-neurology. It is unclear what their assessment or recommendations were, but he was ultimately given tPA out of concern for a brainstem stroke. After administration of tPA he reportedly had about 600cc of bloody OGT output. On arrival here, he is intubated, sedated on propofol and his exam is quite limited. When sedation is turned off, he moves all extremities to noxious stimuli. He did not follow commands. He was later noted to follow some commands from Neuro. In CT scan he became very agitated, moving BUE, so he was sedated. Extubated 6/19 without difficulty. Previous records reviewed and summarized.  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 Encephalopathy due to hyperglycemia s/p tPA at 2107 on 07/21/15 Neurology following and directing further w/u - no acute CVA noted on MRI   Acute encephalopathy -multifactorial including seizure, hyperammonemia, HONK/hyperglycemia Ammonia elevated - initiated lactulose and follow - family reports he does have some AMS at baseline  -MRI brain no acute stroke, hyper T2 signal at left mesial temporal lobe consistent with seizure activity. 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 -07/22/2015 EEG generalized slowing indicative of encephalopathy -Neurology was consulted--felt patient likely had seizure secondary to severe hyperglycemia  -CT angiogram head and neck--suboptimal although no  large vessel occlusion noted -Echocardiogram--EF 55-50 percent, no emboli source -LDL 58 -Hemoglobin A1c--12.9 -Continue aspirin 81 mg daily -no AEDs at this time per neurology -UA neg for pyuria  Hepatic Encephalopathy -RUQ us--suggest liver cirrhosis -increase lactulose to 45 grams tid -recheck ammonia in am  Acute hypoxemic/hypercapneic resp failure 2/2 inability to protect airway Resolved - sats 96% on RA   upper GI bleed No evidence of bleeding at this time - Hgb appears mostly stable - cont to follow - stop PPI gtt  -continue po protonix -baseline Hgb 9-10  Transaminitis  -likely combination of shock liver and NASH -previous abd US revealed steatosis -hepatitis b surface antigen--neg -hep c antibody--neg  HTN  BP currently well controlled  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 baseline ~1.0-1.3  -serum creatinine peaked at 1.68 -resolved  Hyponatremia Likely pseudohyponatremia and liver cirrhosis --overall Na stable--low 130s  Hyperkalemia Resolved  Uncontrolled DM2 A1c 12.3 05/23/15- CBG remains poorly controlled -increase Levemir to 35 units bid -continue ISS -increase novolog to 8 units before meals -hold amaryl and tradjenta   Disposition Plan: SNF 07/27/15 if stable Family Communication: updated son at bedside 6/23   Consultants: Neurology, CCM  Code Status: FULL  DVT Prophylaxis: SCD   Procedures: As Listed in Progress Note Above  Antibiotics: None  Subjective: Patient denies fevers, chills, headache, chest pain, dyspnea, nausea, vomiting, diarrhea, abdominal pain, dysuria, hematuria   Objective: Filed Vitals:   07/25/15 2017 07/26/15 0105 07/26/15 0557 07/26/15 0946  BP: 130/78 134/80 140/67 119/77  Pulse: 87 99 74 90  Temp: 99.1 F (37.3 C) 97.7 F (36.5 C) 98.8 F (37.1 C) 97.9 F (36.6 C)  TempSrc: Oral Oral Oral Oral  Resp: Height:      Weight:      SpO2: 96% 98% 100% 98%   No intake or  output data in the 24 hours ending 07/26/15 1537 Weight change:  Exam:   General:  Pt is alert, follows commands appropriately, not in acute distress  HEENT: No icterus, No thrush, No neck mass, [place]/AT  Cardiovascular: RRR, S1/S2, no rubs, no gallops  Respiratory: CTA bilaterally, no wheezing, no crackles, no rhonchi  Abdomen: Soft/+BS, non tender, non distended, no guarding  Extremities: No edema, No lymphangitis, No petechiae, No rashes, no synovitis   Data Reviewed: I have personally reviewed following labs and imaging studies Basic Metabolic Panel:  Recent Labs Lab 07/22/15 0112 07/22/15 0330 07/24/15 0230 07/25/15 0756 07/26/15 0457  NA 130* 132* 131* 132* 130*  K 6.5* 5.8* 4.2 3.9 4.0  CL 96* 97* 100* 99* 99*  CO2 GLUCOSE 529* 540* 221* 213* 244*  BUN 27* 35* CREATININE 1.54* 1.68* 0.93 1.06 0.98  CALCIUM 8.3* 8.3* 8.3* 8.9 8.6*   Liver Function Tests:  Recent Labs Lab 07/21/15 1927 07/22/15 0330 07/25/15 0756 07/26/15 0457  AST 103* 71* 68* 71*  ALT 88* 75* 61 66*  ALKPHOS 259* 199* 175* 182*  BILITOT 1.1 1.1 1.0 0.7  PROT 7.2 6.0* 6.4* 5.9*  ALBUMIN 3.5 2.8* 2.8* 2.5*   No results for input(s): LIPASE, AMYLASE in the last 168 hours.  Recent Labs Lab 07/21/15 1928 07/23/15 1217 07/25/15 0756 07/26/15 0457  AMMONIA 65* 74* 50* 58*   Coagulation Profile:  Recent Labs Lab 07/22/15 0330  INR 1.29   CBC:  Recent Labs Lab 07/21/15 1927  07/22/15 0330 07/22/15 0707 07/22/15 1133 07/22/15 1902 07/24/15 0230 07/25/15 0756  WBC 8.4  < > 15.2* 13.9* 15.0* 21.0* 10.8* 8.3  NEUTROABS 6.0  --  12.5*  --   --   --   --   --   HGB 13.6  < > 11.2* 10.7* 10.6* 11.2* 9.7* 10.9*  HCT 41.1  < > 35.1* 33.9* 33.2* 34.8* 29.8* 33.8*  MCV 86.8  < > 86.7 85.8 85.1 86.6 84.9 84.5  PLT 155  < > 175 143* 157 154 172 176  < > = values in this interval not displayed. Cardiac Enzymes:  Recent Labs Lab 07/21/15 1927    TROPONINI 0.03   BNP: Invalid input(s): POCBNP CBG:  Recent Labs Lab 07/25/15 1733 07/25/15 2020 07/26/15 0035 07/26/15 0600 07/26/15 1147  GLUCAP 363* 339* 173* 223* 262*   HbA1C: No results for input(s): HGBA1C in the last 72 hours. Urine analysis:    Component Value Date/Time   COLORURINE YELLOW 07/25/2015 1051   COLORURINE YELLOW 12/31/2013 1558   APPEARANCEUR CLEAR 07/25/2015 1051   APPEARANCEUR HAZY 12/31/2013 1558   LABSPEC 1.014 07/25/2015 1051   LABSPEC 1.010 12/31/2013 1558   PHURINE 6.0 07/25/2015 1051   PHURINE 5.0 12/31/2013 1558   GLUCOSEU 500* 07/25/2015 1051   GLUCOSEU 100 mg/dL 16/11/9602 5409   HGBUR LARGE* 07/25/2015 1051   HGBUR NEGATIVE 12/31/2013 1558   BILIRUBINUR NEGATIVE 07/25/2015 1051   BILIRUBINUR NEGATIVE 12/31/2013 1558   KETONESUR NEGATIVE 07/25/2015 1051   KETONESUR NEGATIVE 12/31/2013 1558   PROTEINUR NEGATIVE 07/25/2015 1051   PROTEINUR NEGATIVE 12/31/2013 1558   NITRITE NEGATIVE 07/25/2015 1051   NITRITE NEGATIVE 12/31/2013 1558   LEUKOCYTESUR NEGATIVE 07/25/2015 1051   LEUKOCYTESUR NEGATIVE 12/31/2013 1558   Sepsis  Labs: @LABRCNTIP (procalcitonin:4,lacticidven:4) ) Recent Results (from the past 240 hour(s))  MRSA PCR Screening     Status: Abnormal   Collection Time: 07/22/15 12:24 AM  Result Value Ref Range Status   MRSA by PCR POSITIVE (A) NEGATIVE Final    Comment:        The GeneXpert MRSA Assay (FDA approved for NASAL specimens only), is one component of a comprehensive MRSA colonization surveillance program. It is not intended to diagnose MRSA infection nor to guide or monitor treatment for MRSA infections. RESULT CALLED TO, READ BACK BY AND VERIFIED WITH: CROFT,S RN [id number] AT 0238 SKEEN,P   Urine culture     Status: None   Collection Time: 07/25/15 10:51 AM  Result Value Ref Range Status   Specimen Description URINE, CATHETERIZED  Final   Special Requests NONE  Final   Culture NO GROWTH  Final   Report  Status 07/26/2015 FINAL  Final     Scheduled Meds: . aspirin  81 mg Oral Daily  . insulin aspart  0-20 Units Subcutaneous TID WC  . insulin aspart  0-5 Units Subcutaneous QHS  . insulin aspart  8 Units Subcutaneous TID WC  . insulin detemir  35 Units Subcutaneous BID  . lactulose  30 g Oral TID  . pantoprazole  40 mg Oral BID  . tamsulosin  0.4 mg Oral Daily   Continuous Infusions: . sodium chloride 10 mL/hr at 07/24/15 2094    Procedures/Studies: Ct Angio Head W Or Wo Contrast  07/22/2015  CLINICAL DATA:  Altered mental status. Diabetes and hypertension. Right-sided weakness. EXAM: CT ANGIOGRAPHY HEAD AND NECK TECHNIQUE: Multidetector CT imaging of the head and neck was performed using the standard protocol during bolus administration of intravenous contrast. Multiplanar CT image reconstructions and MIPs were obtained to evaluate the vascular anatomy. Carotid stenosis measurements (when applicable) are obtained utilizing NASCET criteria, using the distal internal carotid diameter as the denominator. CONTRAST:  50 mL Isovue 370 IV. Note that some of the contrast leaked out of the IV connection toward the end of the study. There is limited opacification of the arteries in the neck and head and the study is marginally diagnostic. Repeat study recommended when the patient is able to receive additional contrast. GFR is 40. COMPARISON:  CT head 07/21/2015 FINDINGS: Limited opacification of the arterial circulation. Repeat study recommended. CTA NECK Aortic arch: Mild atherosclerotic calcification aortic arch. Proximal great vessels are patent. Right carotid system: Atherosclerotic calcification proximal right internal carotid artery. Less than 50% diameter stenosis right internal carotid artery. Left carotid system: Atherosclerotic calcification in the carotid bifurcation and carotid bulb. Approximately 50% diameter stenosis left internal carotid artery. Vertebral arteries:Limited visualization of the  vertebral arteries. Both vertebral arteries appear to be patent. Skeleton: Thoracic levoscoliosis. No fracture or skeletal mass lesion. Disc degeneration and spondylosis C4-5 and C5-6. Other neck: Patient is intubated. NG tube in the esophagus. No mass lesion in the neck. CTA HEAD Anterior circulation: Atherosclerotic calcification in the cavernous carotid bilaterally with mild stenosis bilaterally. Anterior and middle cerebral arteries are patent bilaterally. Limited vessel detail. Posterior circulation: Both vertebral arteries patent to the basilar. The basilar is patent. Posterior cerebral arteries are patent bilaterally. Limited vessel detail. Venous sinuses: Patent Anatomic variants: None Delayed phase: Not performed. IMPRESSION: There is limited information on the study due to poor opacification of the arterial circulation. Repeat study is suggested. Mild calcific stenosis of the internal carotid artery bilaterally. No large vessel occlusion identified. These results were called by  telephone at the time of interpretation on 07/22/2015 at 1:47 pm to Dr. Marvel Plan , who verbally acknowledged these results. Electronically Signed   By: Marlan Palau M.D.   On: 07/22/2015 13:48   Dg Chest 1 View  07/21/2015  CLINICAL DATA:  Endotracheal tube and orogastric tube placement. Initial encounter. EXAM: CHEST 1 VIEW COMPARISON:  Chest radiograph performed earlier today at 9:06 p.m. FINDINGS: The patient's endotracheal tube is seen ending 3 cm above the carina. An enteric tube is noted extending below the diaphragm. The lungs are hypoexpanded. Vascular crowding and vascular congestion are noted. No pleural effusion or pneumothorax is seen. The cardiomediastinal silhouette is borderline normal in size. Scattered coronary artery calcifications are noted. No acute osseous abnormalities are identified. An external pacing pad is noted. IMPRESSION: 1. Endotracheal tube seen ending 3 cm above the carina. 2. Enteric tube noted  extending below the diaphragm. 3. Lungs hypoexpanded, with vascular congestion. No focal airspace consolidation seen. 4. Scattered coronary artery calcifications seen. Electronically Signed   By: Roanna Raider M.D.   On: 07/21/2015 23:53   Dg Abd 1 View  07/21/2015  CLINICAL DATA:  Orogastric tube placement.  Initial encounter. EXAM: ABDOMEN - 1 VIEW COMPARISON:  None. FINDINGS: An enteric tube is noted coiling within the stomach. The visualized bowel gas pattern is unremarkable. Scattered air and stool filled loops of colon are seen; no abnormal dilatation of small bowel loops is seen to suggest small bowel obstruction. No free intra-abdominal air is identified, though evaluation for free air is limited on a single supine view. The visualized osseous structures are within normal limits; the sacroiliac joints are unremarkable in appearance. The visualized lung bases are essentially clear. External pacing pads are noted. IMPRESSION: Enteric tube noted coiling within the stomach. Electronically Signed   By: Roanna Raider M.D.   On: 07/21/2015 23:54   Ct Head Wo Contrast  07/22/2015  CLINICAL DATA:  Stroke EXAM: CT HEAD WITHOUT CONTRAST TECHNIQUE: Contiguous axial images were obtained from the base of the skull through the vertex without intravenous contrast. COMPARISON:  CT head 07/21/2015 FINDINGS: Generalized atrophy.  Negative for hydrocephalus. Negative for acute infarct.  Negative for hemorrhage or mass Atherosclerotic calcification. Negative calvarium. IMPRESSION: No acute abnormality. Electronically Signed   By: Marlan Palau M.D.   On: 07/22/2015 21:03   Ct Head Wo Contrast  07/21/2015  CLINICAL DATA:  Elevated blood sugar. Altered mental status today. Initial encounter. EXAM: CT HEAD WITHOUT CONTRAST TECHNIQUE: Contiguous axial images were obtained from the base of the skull through the vertex without intravenous contrast. COMPARISON:  Head CT scan 12/30/2013. FINDINGS: Cortical atrophy is again  seen. No evidence of acute intracranial abnormality including hemorrhage, infarct, mass lesion, mass effect, midline shift or abnormal extra-axial fluid collection. No hydrocephalus or pneumocephalus. The calvarium is intact. Imaged paranasal sinuses and mastoid air cells are clear. IMPRESSION: No acute abnormality. Atrophy. Electronically Signed   By: Drusilla Kanner M.D.   On: 07/21/2015 19:44   Ct Angio Neck W Or Wo Contrast  07/22/2015  CLINICAL DATA:  Altered mental status. Diabetes and hypertension. Right-sided weakness. EXAM: CT ANGIOGRAPHY HEAD AND NECK TECHNIQUE: Multidetector CT imaging of the head and neck was performed using the standard protocol during bolus administration of intravenous contrast. Multiplanar CT image reconstructions and MIPs were obtained to evaluate the vascular anatomy. Carotid stenosis measurements (when applicable) are obtained utilizing NASCET criteria, using the distal internal carotid diameter as the denominator. CONTRAST:  50 mL Isovue  370 IV. Note that some of the contrast leaked out of the IV connection toward the end of the study. There is limited opacification of the arteries in the neck and head and the study is marginally diagnostic. Repeat study recommended when the patient is able to receive additional contrast. GFR is 40. COMPARISON:  CT head 07/21/2015 FINDINGS: Limited opacification of the arterial circulation. Repeat study recommended. CTA NECK Aortic arch: Mild atherosclerotic calcification aortic arch. Proximal great vessels are patent. Right carotid system: Atherosclerotic calcification proximal right internal carotid artery. Less than 50% diameter stenosis right internal carotid artery. Left carotid system: Atherosclerotic calcification in the carotid bifurcation and carotid bulb. Approximately 50% diameter stenosis left internal carotid artery. Vertebral arteries:Limited visualization of the vertebral arteries. Both vertebral arteries appear to be patent.  Skeleton: Thoracic levoscoliosis. No fracture or skeletal mass lesion. Disc degeneration and spondylosis C4-5 and C5-6. Other neck: Patient is intubated. NG tube in the esophagus. No mass lesion in the neck. CTA HEAD Anterior circulation: Atherosclerotic calcification in the cavernous carotid bilaterally with mild stenosis bilaterally. Anterior and middle cerebral arteries are patent bilaterally. Limited vessel detail. Posterior circulation: Both vertebral arteries patent to the basilar. The basilar is patent. Posterior cerebral arteries are patent bilaterally. Limited vessel detail. Venous sinuses: Patent Anatomic variants: None Delayed phase: Not performed. IMPRESSION: There is limited information on the study due to poor opacification of the arterial circulation. Repeat study is suggested. Mild calcific stenosis of the internal carotid artery bilaterally. No large vessel occlusion identified. These results were called by telephone at the time of interpretation on 07/22/2015 at 1:47 pm to Dr. Marvel Plan , who verbally acknowledged these results. Electronically Signed   By: Marlan Palau M.D.   On: 07/22/2015 13:48   Mr Brain Wo Contrast  07/23/2015  CLINICAL DATA:  70 year old diabetic hypertensive male with altered mental status. Initial evaluation revealed hyperglycemia. Subsequent encounter. EXAM: MRI HEAD WITHOUT CONTRAST TECHNIQUE: Multiplanar, multiecho pulse sequences of the brain and surrounding structures were obtained without intravenous contrast. COMPARISON:  07/22/2015 head CT and CT angiogram. FINDINGS: Patient was not able to complete the entire examination. Altered signal intensity of medial aspect of the left temporal lobe involving hippocampus and amygdala is a nonspecific finding. Herpes encephalitis needs to be considered and excluded based on clinical results. Seizure activity can cause a similar appearance. Result of metabolic abnormality (elevated or depressed glucose levels) also a  consideration. This does not appear be an acute infarct. Remote very small thalamic infarcts and right paracentral pontine infarct. Mild chronic nonspecific white matter changes. Mild to moderate global atrophy without hydrocephalus. No intracranial hemorrhage. No intracranial mass lesion noted on this unenhanced exam. Major intracranial vascular structures are patent. Mild mucosal thickening ethmoid sinus air cells. Post lens replacement without acute abnormality noted. IMPRESSION: Patient was not able to complete the entire examination. Altered signal intensity of medial aspect of the left temporal lobe involving hippocampus and amygdala is a nonspecific finding. Herpes encephalitis needs to be considered and excluded based on clinical results. Seizure activity can cause a similar appearance. Result of metabolic abnormality (elevated or depressed glucose levels) also a consideration. This does not appear to be an acute infarct. Remote very small thalamic infarcts and right paracentral pontine infarct. Mild to moderate global atrophy without hydrocephalus. These results were called by telephone at the time of interpretation on 07/23/2015 at 4:00 pm to Dr. Marvel Plan , who verbally acknowledged these results. Electronically Signed   By: Ernie Hew.D.  On: 07/23/2015 16:04   Dg Chest Port 1 View  07/24/2015  CLINICAL DATA:  Respiratory failure. EXAM: PORTABLE CHEST 1 VIEW COMPARISON:  07/22/2015. FINDINGS: Interim extubation and removal of NG tube. Cardiomegaly with normal pulmonary vascularity . Low lung volumes with mild bibasilar atelectasis. No pleural effusion or pneumothorax. IMPRESSION: 1. Interim removal of endotracheal and NG tubes. 2.  Cardiomegaly.  No evidence of congestive heart failure. 3. Low lung volumes with mild bibasilar atelectasis. Electronically Signed   By: Maisie Fus  Register   On: 07/24/2015 07:02   Dg Chest Port 1 View  07/22/2015  CLINICAL DATA:  Status post CVA, acute renal  insufficiency, coronary artery disease, diabetes. EXAM: PORTABLE CHEST 1 VIEW COMPARISON:  Portable chest x-ray of July 21, 2015 FINDINGS: The lungs are mildly hypoinflated. The retrocardiac region on the left is more dense today. The cardiac silhouette is larger today. The central pulmonary vascularity is prominent. The endotracheal tube tip lies approximately 3.8 cm above the carina. IMPRESSION: Interval increase in the conspicuity of the cardiac silhouette and pulmonary vascularity suggests mild CHF. There is no significant pleural effusion and no evidence of pneumonia. Developing atelectasis in the left lower lobe is suspected however. Electronically Signed   By: Kue Fox  [place] M.D.   On: 07/22/2015 07:31   Dg Chest Portable 1 View  07/21/2015  CLINICAL DATA:  Post intubation. EXAM: PORTABLE CHEST 1 VIEW COMPARISON:  05/29/2015 FINDINGS: Endotracheal tube has tip 2.8 cm above the carina. Lungs are hypoinflated with bilateral perihilar opacification likely mild interstitial edema and less likely infection. No evidence of effusion or pneumothorax. Mild stable cardiomegaly. Coronary stent over the left heart. Remainder of the exam is unchanged. IMPRESSION: Perihilar opacification likely interstitial edema and less likely infection. Mild stable cardiomegaly. Endotracheal tube with tip 2.8 cm above the carina. Electronically Signed   By: Elberta Fortis M.D.   On: 07/21/2015 21:26   US Abdomen Limited Ruq  07/25/2015  CLINICAL DATA:  Patient with history of cirrhosis.  Morbid obesity. EXAM: US ABDOMEN LIMITED - RIGHT UPPER QUADRANT COMPARISON:  Renal ultrasound 01/01/2014. FINDINGS: Gallbladder: No gallstones or wall thickening visualized. No sonographic Murphy sign noted by sonographer. Common bile duct: Diameter: 4 mm Liver: The liver is heterogeneous in echogenicity and nodular in contour. IMPRESSION: Heterogeneous echogenicity of the liver and mildly nodular contour compatible with cirrhosis. Electronically  Signed   By: Annia Belt M.D.   On: 07/25/2015 16:28    Ester Hilley, DO  [facility] Pager [phone]  If 7PM-7AM, please contact night-coverage [email or web address] Password [facility] 07/26/2015, 3:37 PM   LOS: 4 days

## 2015-07-26 NOTE — Care Management Note (Signed)
Case Management Note  Patient Details  Name: AVENIR HENNEY MRN: [id number] Date of Birth: [birth year]  Subjective/Objective:   Pt admitted on 07/22/15 with acute encephalopathy.   PTA, pt and his wife lived with their son, who works.                    Action/Plan: Pt remains encephalopathic with elevated ammonia levels.  CSW following for likely SNF placement.  Will continue to follow as pt progresses.    Expected Discharge Date:                  Expected Discharge Plan:  Skilled Nursing Facility  In-House Referral:  Clinical Social Work  Discharge planning Services  CM Consult  Post Acute Care Choice:    Choice offered to:     DME Arranged:    DME Agency:     HH Arranged:    HH Agency:     Status of Service:  In process, will continue to follow  If discussed at Long Length of Stay Meetings, dates discussed:    Additional Comments:  Glennon Mac, RN 07/26/2015, 11:37 AM

## 2015-07-26 NOTE — Progress Notes (Signed)
CSW has coordinated with CSW at [facility] to get this patient and his wife discharged to the same SNF. Patient can discharge to [facility] when stable.  Osborne Casco Normajean Nash LCSWA [phone]

## 2015-07-26 NOTE — Discharge Summary (Addendum)
Physician Discharge Summary  Jay [name]:[id number] DOB: 1945/05/06 DOA: 07/22/2015  PCP: Megan Mans, MD  Admit date: 07/22/2015  Discharge date: 07/27/15   Admitted From: Home Disposition:  SNF   Recommendations for Outpatient Follow-up:  1. Follow up with PCP in 1-2 weeks 2. Please obtain BMP/CBC in one week 3. Please continue to monitor CBGs q ac and hs and adjust pt's insulin regimen accordingly   Discharge Condition:stable CODE STATUS:FULL Diet recommendation: Heart Healthy / Carb Modified  Brief/Interim Summary: 70y/o male with hx of DM2, HTN, HLD, CAD s/p stent who presented to [facility] 6/18 with complaints of altered mental status. Initial evaluation revealed marked hyperglycemia, but during evaluation he reportedly became more altered, had a rightward gaze preference and dropped his sats. He was intubated and his case was discussed with tele-neurology. It is unclear what their assessment or recommendations were, but he was ultimately given tPA out of concern for a brainstem stroke. After administration of tPA he reportedly had about 600cc of bloody OGT output. On arrival here, he is intubated, sedated on propofol and his exam is quite limited. When sedation is turned off, he moves all extremities to noxious stimuli. He did not follow commands. He was later noted to follow some commands from Neuro. In CT scan he became very agitated, moving BUE, so he was sedated. Extubated 6/19 without difficulty. Patient was transferred to the medical floor with further workup revealed the patient likely hepatic encephalopathy. The patient's lactulose dose was adjusted to allow for one to 2 bowel movements daily. Previous records reviewed and summarized.  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 -07/22/2015 EEG generalized slowing indicative of encephalopathy -Neurology was consulted--felt patient likely had seizure secondary to severe hyperglycemia  -CT angiogram head and neck--suboptimal although no large vessel occlusion noted -Echocardiogram--EF 55-50 percent, no emboli source -LDL 58--continue present doser crestor -Hemoglobin A1c--12.9 -Continue aspirin 81 mg daily -no AEDs at this time per neurology -UA neg for pyuria -05/23/2015 TSH 1.190  Hepatic Encephalopathy -RUQ us--suggest liver cirrhosis -d/c with lactulose to 30 grams tid with goal of at least 1-2 BM per 24 hours -ammonia =  36 on day of d/c  Acute hypoxemic/hypercapneic resp failure 2/2 inability to protect airway Resolved - sats 96% on RA   upper GI bleed No evidence of bleeding since transfer to [facility] - Hgb appears stable - cont to follow - stop PPI gtt  -continue po protonix  -baseline Hgb 9-10  Transaminitis  -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 baseline ~1.0-1.3  -serum creatinine peaked at 1.68 -resolved  Hyponatremia Likely pseudohyponatremia and liver cirrhosis --overall Na stable--low 130s  Hyperkalemia -Resolved -K = 4.2 on day of d/c  Uncontrolled DM2 -A1c 12.3 05/23/15- CBG remains poorly controlled -increase Levemir to 50 units bid -continue ISS -increase novolog to 8 units before meals -  restart  amaryl and tradjenta after d/c   Discharge Instructions      Discharge Instructions    Diet - low sodium heart healthy    Complete by:  As directed      Increase activity slowly    Complete by:  As directed             Medication List    STOP taking these medications        LEVEMIR FLEXTOUCH 100 UNIT/ML Pen  Generic drug:  Insulin Detemir  Replaced by:  insulin detemir 100 UNIT/ML injection     SODIUM CHLORIDE (EXTERNAL) 0.9 % Soln  Commonly known as:  [employer] SALINE WOUND WASH      TAKE these medications        aspirin 81 MG tablet  Take 1 tablet by mouth daily.     ferrous sulfate 325 (65 FE) MG tablet  Take 1 tablet (325 mg total) by mouth 2 (two) times daily.     glimepiride 4 MG tablet  Commonly known as:  AMARYL  TAKE 2 TABLETS BY MOUTH TWICE DAILY.     insulin aspart 100 UNIT/ML injection  Commonly known as:  novoLOG  Inject 8 Units into the skin 3 (three) times daily with meals.     insulin detemir 100 UNIT/ML injection  Commonly known as:  LEVEMIR  Inject 0.5 mLs (50 Units total) into the skin 2 (two) times daily.     lactulose 10 GM/15ML solution  Commonly known as:  CHRONULAC  Take 45 mLs (30 g total) by mouth 3 (three) times daily.     loratadine 10 MG tablet  Commonly known as:  CLARITIN  Take 1 tablet (10 mg total) by mouth daily.     meclizine 25 MG tablet  Commonly known as:  ANTIVERT  TAKE 1 TABLET BY MOUTH 3 TIMES DAILY AS NEEDED.     metoprolol tartrate 25 MG tablet  Commonly known as:  LOPRESSOR  Take 0.5 tablets (12.5 mg total) by mouth 2 (two) times daily.     mometasone 0.1 % cream  Commonly known as:  ELOCON  Apply 1 application topically daily. For 2 weeks     pantoprazole 40 MG tablet  Commonly known as:  PROTONIX  TAKE 1 TABLET BY MOUTH TWICE DAILY.     promethazine 25 MG tablet  Commonly known as:  PHENERGAN  Take 1 tablet (25 mg total) by mouth every 4 (four) hours as needed for nausea or vomiting.     rosuvastatin  10 MG tablet  Commonly known as:  CRESTOR  Take 1 tablet (10 mg total) by mouth daily.     tamsulosin 0.4 MG Caps capsule  Commonly known as:  FLOMAX  Take 1 capsule (0.4 mg total) by mouth daily.     TRADJENTA 5 MG Tabs tablet  Generic drug:  linagliptin  TAKE 1 TABLET BY MOUTH ONCE DAILY.       Follow-up Information    Follow up with [facility] .   Specialty:  Skilled Nursing Facility   Contact information:   [address] [place] [address] [phone]     Allergies  Allergen Reactions  . Metformin Nausea Only    Consultations: Neurology PCCM   Procedures/Studies: Ct Angio Head W Or Wo Contrast  07/22/2015  CLINICAL DATA:  Altered mental status. Diabetes and hypertension. Right-sided weakness. EXAM: CT ANGIOGRAPHY HEAD AND NECK TECHNIQUE: Multidetector CT imaging of the head and neck  was performed using the standard protocol during bolus administration of intravenous contrast. Multiplanar CT image reconstructions and MIPs were obtained to evaluate the vascular anatomy. Carotid stenosis measurements (when applicable) are obtained utilizing NASCET criteria, using the distal internal carotid diameter as the denominator. CONTRAST:  50 mL Isovue 370 IV. Note that some of the contrast leaked out of the IV connection toward the end of the study. There is limited opacification of the arteries in the neck and head and the study is marginally diagnostic. Repeat study recommended when the patient is able to receive additional contrast. GFR is 40. COMPARISON:  CT head 07/21/2015 FINDINGS: Limited opacification of the arterial circulation. Repeat study recommended. CTA NECK Aortic arch: Mild atherosclerotic calcification aortic arch. Proximal great vessels are patent. Right carotid system: Atherosclerotic calcification proximal right internal carotid artery. Less than 50% diameter stenosis right internal carotid artery. Left carotid system: Atherosclerotic  calcification in the carotid bifurcation and carotid bulb. Approximately 50% diameter stenosis left internal carotid artery. Vertebral arteries:Limited visualization of the vertebral arteries. Both vertebral arteries appear to be patent. Skeleton: Thoracic levoscoliosis. No fracture or skeletal mass lesion. Disc degeneration and spondylosis C4-5 and C5-6. Other neck: Patient is intubated. NG tube in the esophagus. No mass lesion in the neck. CTA HEAD Anterior circulation: Atherosclerotic calcification in the cavernous carotid bilaterally with mild stenosis bilaterally. Anterior and middle cerebral arteries are patent bilaterally. Limited vessel detail. Posterior circulation: Both vertebral arteries patent to the basilar. The basilar is patent. Posterior cerebral arteries are patent bilaterally. Limited vessel detail. Venous sinuses: Patent Anatomic variants: None Delayed phase: Not performed. IMPRESSION: There is limited information on the study due to poor opacification of the arterial circulation. Repeat study is suggested. Mild calcific stenosis of the internal carotid artery bilaterally. No large vessel occlusion identified. These results were called by telephone at the time of interpretation on 07/22/2015 at 1:47 pm to Dr. Marvel Plan , who verbally acknowledged these results. Electronically Signed   By: Marlan Palau M.D.   On: 07/22/2015 13:48   Dg Chest 1 View  07/21/2015  CLINICAL DATA:  Endotracheal tube and orogastric tube placement. Initial encounter. EXAM: CHEST 1 VIEW COMPARISON:  Chest radiograph performed earlier today at 9:06 p.m. FINDINGS: The patient's endotracheal tube is seen ending 3 cm above the carina. An enteric tube is noted extending below the diaphragm. The lungs are hypoexpanded. Vascular crowding and vascular congestion are noted. No pleural effusion or pneumothorax is seen. The cardiomediastinal silhouette is borderline normal in size. Scattered coronary artery calcifications are  noted. No acute osseous abnormalities are identified. An external pacing pad is noted. IMPRESSION: 1. Endotracheal tube seen ending 3 cm above the carina. 2. Enteric tube noted extending below the diaphragm. 3. Lungs hypoexpanded, with vascular congestion. No focal airspace consolidation seen. 4. Scattered coronary artery calcifications seen. Electronically Signed   By: Roanna Raider M.D.   On: 07/21/2015 23:53   Dg Abd 1 View  07/21/2015  CLINICAL DATA:  Orogastric tube placement.  Initial encounter. EXAM: ABDOMEN - 1 VIEW COMPARISON:  None. FINDINGS: An enteric tube is noted coiling within the stomach. The visualized bowel gas pattern is unremarkable. Scattered air and stool filled loops of colon are seen; no abnormal dilatation of small bowel loops is seen to suggest small bowel obstruction. No free intra-abdominal air is identified, though evaluation for free air is limited on a single supine view. The visualized osseous structures are within normal limits; the sacroiliac joints are unremarkable in appearance.  The visualized lung bases are essentially clear. External pacing pads are noted. IMPRESSION: Enteric tube noted coiling within the stomach. Electronically Signed   By: Roanna Raider M.D.   On: 07/21/2015 23:54   Ct Head Wo Contrast  07/22/2015  CLINICAL DATA:  Stroke EXAM: CT HEAD WITHOUT CONTRAST TECHNIQUE: Contiguous axial images were obtained from the base of the skull through the vertex without intravenous contrast. COMPARISON:  CT head 07/21/2015 FINDINGS: Generalized atrophy.  Negative for hydrocephalus. Negative for acute infarct.  Negative for hemorrhage or mass Atherosclerotic calcification. Negative calvarium. IMPRESSION: No acute abnormality. Electronically Signed   By: Marlan Palau M.D.   On: 07/22/2015 21:03   Ct Head Wo Contrast  07/21/2015  CLINICAL DATA:  Elevated blood sugar. Altered mental status today. Initial encounter. EXAM: CT HEAD WITHOUT CONTRAST TECHNIQUE: Contiguous  axial images were obtained from the base of the skull through the vertex without intravenous contrast. COMPARISON:  Head CT scan 12/30/2013. FINDINGS: Cortical atrophy is again seen. No evidence of acute intracranial abnormality including hemorrhage, infarct, mass lesion, mass effect, midline shift or abnormal extra-axial fluid collection. No hydrocephalus or pneumocephalus. The calvarium is intact. Imaged paranasal sinuses and mastoid air cells are clear. IMPRESSION: No acute abnormality. Atrophy. Electronically Signed   By: Drusilla Kanner M.D.   On: 07/21/2015 19:44   Ct Angio Neck W Or Wo Contrast  07/22/2015  CLINICAL DATA:  Altered mental status. Diabetes and hypertension. Right-sided weakness. EXAM: CT ANGIOGRAPHY HEAD AND NECK TECHNIQUE: Multidetector CT imaging of the head and neck was performed using the standard protocol during bolus administration of intravenous contrast. Multiplanar CT image reconstructions and MIPs were obtained to evaluate the vascular anatomy. Carotid stenosis measurements (when applicable) are obtained utilizing NASCET criteria, using the distal internal carotid diameter as the denominator. CONTRAST:  50 mL Isovue 370 IV. Note that some of the contrast leaked out of the IV connection toward the end of the study. There is limited opacification of the arteries in the neck and head and the study is marginally diagnostic. Repeat study recommended when the patient is able to receive additional contrast. GFR is 40. COMPARISON:  CT head 07/21/2015 FINDINGS: Limited opacification of the arterial circulation. Repeat study recommended. CTA NECK Aortic arch: Mild atherosclerotic calcification aortic arch. Proximal great vessels are patent. Right carotid system: Atherosclerotic calcification proximal right internal carotid artery. Less than 50% diameter stenosis right internal carotid artery. Left carotid system: Atherosclerotic calcification in the carotid bifurcation and carotid bulb.  Approximately 50% diameter stenosis left internal carotid artery. Vertebral arteries:Limited visualization of the vertebral arteries. Both vertebral arteries appear to be patent. Skeleton: Thoracic levoscoliosis. No fracture or skeletal mass lesion. Disc degeneration and spondylosis C4-5 and C5-6. Other neck: Patient is intubated. NG tube in the esophagus. No mass lesion in the neck. CTA HEAD Anterior circulation: Atherosclerotic calcification in the cavernous carotid bilaterally with mild stenosis bilaterally. Anterior and middle cerebral arteries are patent bilaterally. Limited vessel detail. Posterior circulation: Both vertebral arteries patent to the basilar. The basilar is patent. Posterior cerebral arteries are patent bilaterally. Limited vessel detail. Venous sinuses: Patent Anatomic variants: None Delayed phase: Not performed. IMPRESSION: There is limited information on the study due to poor opacification of the arterial circulation. Repeat study is suggested. Mild calcific stenosis of the internal carotid artery bilaterally. No large vessel occlusion identified. These results were called by telephone at the time of interpretation on 07/22/2015 at 1:47 pm to Dr. Marvel Plan , who verbally acknowledged these results.  Electronically Signed   By: Marlan Palau M.D.   On: 07/22/2015 13:48   Mr Brain Wo Contrast  07/23/2015  CLINICAL DATA:  70 year old diabetic hypertensive male with altered mental status. Initial evaluation revealed hyperglycemia. Subsequent encounter. EXAM: MRI HEAD WITHOUT CONTRAST TECHNIQUE: Multiplanar, multiecho pulse sequences of the brain and surrounding structures were obtained without intravenous contrast. COMPARISON:  07/22/2015 head CT and CT angiogram. FINDINGS: Patient was not able to complete the entire examination. Altered signal intensity of medial aspect of the left temporal lobe involving hippocampus and amygdala is a nonspecific finding. Herpes encephalitis needs to be  considered and excluded based on clinical results. Seizure activity can cause a similar appearance. Result of metabolic abnormality (elevated or depressed glucose levels) also a consideration. This does not appear be an acute infarct. Remote very small thalamic infarcts and right paracentral pontine infarct. Mild chronic nonspecific white matter changes. Mild to moderate global atrophy without hydrocephalus. No intracranial hemorrhage. No intracranial mass lesion noted on this unenhanced exam. Major intracranial vascular structures are patent. Mild mucosal thickening ethmoid sinus air cells. Post lens replacement without acute abnormality noted. IMPRESSION: Patient was not able to complete the entire examination. Altered signal intensity of medial aspect of the left temporal lobe involving hippocampus and amygdala is a nonspecific finding. Herpes encephalitis needs to be considered and excluded based on clinical results. Seizure activity can cause a similar appearance. Result of metabolic abnormality (elevated or depressed glucose levels) also a consideration. This does not appear to be an acute infarct. Remote very small thalamic infarcts and right paracentral pontine infarct. Mild to moderate global atrophy without hydrocephalus. These results were called by telephone at the time of interpretation on 07/23/2015 at 4:00 pm to Dr. Marvel Plan , who verbally acknowledged these results. Electronically Signed   By: Lacy Duverney M.D.   On: 07/23/2015 16:04   Dg Chest Port 1 View  07/24/2015  CLINICAL DATA:  Respiratory failure. EXAM: PORTABLE CHEST 1 VIEW COMPARISON:  07/22/2015. FINDINGS: Interim extubation and removal of NG tube. Cardiomegaly with normal pulmonary vascularity . Low lung volumes with mild bibasilar atelectasis. No pleural effusion or pneumothorax. IMPRESSION: 1. Interim removal of endotracheal and NG tubes. 2.  Cardiomegaly.  No evidence of congestive heart failure. 3. Low lung volumes with mild  bibasilar atelectasis. Electronically Signed   By: Maisie Fus  Register   On: 07/24/2015 07:02   Dg Chest Port 1 View  07/22/2015  CLINICAL DATA:  Status post CVA, acute renal insufficiency, coronary artery disease, diabetes. EXAM: PORTABLE CHEST 1 VIEW COMPARISON:  Portable chest x-ray of July 21, 2015 FINDINGS: The lungs are mildly hypoinflated. The retrocardiac region on the left is more dense today. The cardiac silhouette is larger today. The central pulmonary vascularity is prominent. The endotracheal tube tip lies approximately 3.8 cm above the carina. IMPRESSION: Interval increase in the conspicuity of the cardiac silhouette and pulmonary vascularity suggests mild CHF. There is no significant pleural effusion and no evidence of pneumonia. Developing atelectasis in the left lower lobe is suspected however. Electronically Signed   By: Ineta Sinning  [place] M.D.   On: 07/22/2015 07:31   Dg Chest Portable 1 View  07/21/2015  CLINICAL DATA:  Post intubation. EXAM: PORTABLE CHEST 1 VIEW COMPARISON:  05/29/2015 FINDINGS: Endotracheal tube has tip 2.8 cm above the carina. Lungs are hypoinflated with bilateral perihilar opacification likely mild interstitial edema and less likely infection. No evidence of effusion or pneumothorax. Mild stable cardiomegaly. Coronary stent over the left heart. Remainder  of the exam is unchanged. IMPRESSION: Perihilar opacification likely interstitial edema and less likely infection. Mild stable cardiomegaly. Endotracheal tube with tip 2.8 cm above the carina. Electronically Signed   By: Elberta Fortis M.D.   On: 07/21/2015 21:26   US Abdomen Limited Ruq  07/25/2015  CLINICAL DATA:  Patient with history of cirrhosis.  Morbid obesity. EXAM: US ABDOMEN LIMITED - RIGHT UPPER QUADRANT COMPARISON:  Renal ultrasound 01/01/2014. FINDINGS: Gallbladder: No gallstones or wall thickening visualized. No sonographic Murphy sign noted by sonographer. Common bile duct: Diameter: 4 mm Liver: The liver is  heterogeneous in echogenicity and nodular in contour. IMPRESSION: Heterogeneous echogenicity of the liver and mildly nodular contour compatible with cirrhosis. Electronically Signed   By: Annia Belt M.D.   On: 07/25/2015 16:28        Discharge Exam: Filed Vitals:   07/27/15 0137 07/27/15 0914  BP: 118/74 125/87  Pulse: 89 110  Temp: 98.6 F (37 C) 98.1 F (36.7 C)  Resp: 18 18   Filed Vitals:   07/26/15 1706 07/26/15 2125 07/27/15 0137 07/27/15 0914  BP: 109/75 100/55 118/74 125/87  Pulse: 87 96 89 110  Temp: 97.5 F (36.4 C) 98.8 F (37.1 C) 98.6 F (37 C) 98.1 F (36.7 C)  TempSrc: Oral Oral Oral Oral  Resp: 19 18 18 18   Height:      Weight:      SpO2: 100% 96% 98% 98%    General: Pt is alert, awake, not in acute distress Cardiovascular: RRR, S1/S2 +, no rubs, no gallops Respiratory: CTA bilaterally, no wheezing, no rhonchi Abdominal: Soft, NT, ND, bowel sounds + Extremities: no edema, no cyanosis   The results of significant diagnostics from this hospitalization (including imaging, microbiology, ancillary and laboratory) are listed below for reference.    Significant Diagnostic Studies: Ct Angio Head W Or Wo Contrast  07/22/2015  CLINICAL DATA:  Altered mental status. Diabetes and hypertension. Right-sided weakness. EXAM: CT ANGIOGRAPHY HEAD AND NECK TECHNIQUE: Multidetector CT imaging of the head and neck was performed using the standard protocol during bolus administration of intravenous contrast. Multiplanar CT image reconstructions and MIPs were obtained to evaluate the vascular anatomy. Carotid stenosis measurements (when applicable) are obtained utilizing NASCET criteria, using the distal internal carotid diameter as the denominator. CONTRAST:  50 mL Isovue 370 IV. Note that some of the contrast leaked out of the IV connection toward the end of the study. There is limited opacification of the arteries in the neck and head and the study is marginally diagnostic.  Repeat study recommended when the patient is able to receive additional contrast. GFR is 40. COMPARISON:  CT head 07/21/2015 FINDINGS: Limited opacification of the arterial circulation. Repeat study recommended. CTA NECK Aortic arch: Mild atherosclerotic calcification aortic arch. Proximal great vessels are patent. Right carotid system: Atherosclerotic calcification proximal right internal carotid artery. Less than 50% diameter stenosis right internal carotid artery. Left carotid system: Atherosclerotic calcification in the carotid bifurcation and carotid bulb. Approximately 50% diameter stenosis left internal carotid artery. Vertebral arteries:Limited visualization of the vertebral arteries. Both vertebral arteries appear to be patent. Skeleton: Thoracic levoscoliosis. No fracture or skeletal mass lesion. Disc degeneration and spondylosis C4-5 and C5-6. Other neck: Patient is intubated. NG tube in the esophagus. No mass lesion in the neck. CTA HEAD Anterior circulation: Atherosclerotic calcification in the cavernous carotid bilaterally with mild stenosis bilaterally. Anterior and middle cerebral arteries are patent bilaterally. Limited vessel detail. Posterior circulation: Both vertebral arteries patent to the  basilar. The basilar is patent. Posterior cerebral arteries are patent bilaterally. Limited vessel detail. Venous sinuses: Patent Anatomic variants: None Delayed phase: Not performed. IMPRESSION: There is limited information on the study due to poor opacification of the arterial circulation. Repeat study is suggested. Mild calcific stenosis of the internal carotid artery bilaterally. No large vessel occlusion identified. These results were called by telephone at the time of interpretation on 07/22/2015 at 1:47 pm to Dr. Marvel Plan , who verbally acknowledged these results. Electronically Signed   By: Marlan Palau M.D.   On: 07/22/2015 13:48   Dg Chest 1 View  07/21/2015  CLINICAL DATA:  Endotracheal tube  and orogastric tube placement. Initial encounter. EXAM: CHEST 1 VIEW COMPARISON:  Chest radiograph performed earlier today at 9:06 p.m. FINDINGS: The patient's endotracheal tube is seen ending 3 cm above the carina. An enteric tube is noted extending below the diaphragm. The lungs are hypoexpanded. Vascular crowding and vascular congestion are noted. No pleural effusion or pneumothorax is seen. The cardiomediastinal silhouette is borderline normal in size. Scattered coronary artery calcifications are noted. No acute osseous abnormalities are identified. An external pacing pad is noted. IMPRESSION: 1. Endotracheal tube seen ending 3 cm above the carina. 2. Enteric tube noted extending below the diaphragm. 3. Lungs hypoexpanded, with vascular congestion. No focal airspace consolidation seen. 4. Scattered coronary artery calcifications seen. Electronically Signed   By: Roanna Raider M.D.   On: 07/21/2015 23:53   Dg Abd 1 View  07/21/2015  CLINICAL DATA:  Orogastric tube placement.  Initial encounter. EXAM: ABDOMEN - 1 VIEW COMPARISON:  None. FINDINGS: An enteric tube is noted coiling within the stomach. The visualized bowel gas pattern is unremarkable. Scattered air and stool filled loops of colon are seen; no abnormal dilatation of small bowel loops is seen to suggest small bowel obstruction. No free intra-abdominal air is identified, though evaluation for free air is limited on a single supine view. The visualized osseous structures are within normal limits; the sacroiliac joints are unremarkable in appearance. The visualized lung bases are essentially clear. External pacing pads are noted. IMPRESSION: Enteric tube noted coiling within the stomach. Electronically Signed   By: Roanna Raider M.D.   On: 07/21/2015 23:54   Ct Head Wo Contrast  07/22/2015  CLINICAL DATA:  Stroke EXAM: CT HEAD WITHOUT CONTRAST TECHNIQUE: Contiguous axial images were obtained from the base of the skull through the vertex without  intravenous contrast. COMPARISON:  CT head 07/21/2015 FINDINGS: Generalized atrophy.  Negative for hydrocephalus. Negative for acute infarct.  Negative for hemorrhage or mass Atherosclerotic calcification. Negative calvarium. IMPRESSION: No acute abnormality. Electronically Signed   By: Marlan Palau M.D.   On: 07/22/2015 21:03   Ct Head Wo Contrast  07/21/2015  CLINICAL DATA:  Elevated blood sugar. Altered mental status today. Initial encounter. EXAM: CT HEAD WITHOUT CONTRAST TECHNIQUE: Contiguous axial images were obtained from the base of the skull through the vertex without intravenous contrast. COMPARISON:  Head CT scan 12/30/2013. FINDINGS: Cortical atrophy is again seen. No evidence of acute intracranial abnormality including hemorrhage, infarct, mass lesion, mass effect, midline shift or abnormal extra-axial fluid collection. No hydrocephalus or pneumocephalus. The calvarium is intact. Imaged paranasal sinuses and mastoid air cells are clear. IMPRESSION: No acute abnormality. Atrophy. Electronically Signed   By: Drusilla Kanner M.D.   On: 07/21/2015 19:44   Ct Angio Neck W Or Wo Contrast  07/22/2015  CLINICAL DATA:  Altered mental status. Diabetes and hypertension. Right-sided weakness. EXAM:  CT ANGIOGRAPHY HEAD AND NECK TECHNIQUE: Multidetector CT imaging of the head and neck was performed using the standard protocol during bolus administration of intravenous contrast. Multiplanar CT image reconstructions and MIPs were obtained to evaluate the vascular anatomy. Carotid stenosis measurements (when applicable) are obtained utilizing NASCET criteria, using the distal internal carotid diameter as the denominator. CONTRAST:  50 mL Isovue 370 IV. Note that some of the contrast leaked out of the IV connection toward the end of the study. There is limited opacification of the arteries in the neck and head and the study is marginally diagnostic. Repeat study recommended when the patient is able to receive  additional contrast. GFR is 40. COMPARISON:  CT head 07/21/2015 FINDINGS: Limited opacification of the arterial circulation. Repeat study recommended. CTA NECK Aortic arch: Mild atherosclerotic calcification aortic arch. Proximal great vessels are patent. Right carotid system: Atherosclerotic calcification proximal right internal carotid artery. Less than 50% diameter stenosis right internal carotid artery. Left carotid system: Atherosclerotic calcification in the carotid bifurcation and carotid bulb. Approximately 50% diameter stenosis left internal carotid artery. Vertebral arteries:Limited visualization of the vertebral arteries. Both vertebral arteries appear to be patent. Skeleton: Thoracic levoscoliosis. No fracture or skeletal mass lesion. Disc degeneration and spondylosis C4-5 and C5-6. Other neck: Patient is intubated. NG tube in the esophagus. No mass lesion in the neck. CTA HEAD Anterior circulation: Atherosclerotic calcification in the cavernous carotid bilaterally with mild stenosis bilaterally. Anterior and middle cerebral arteries are patent bilaterally. Limited vessel detail. Posterior circulation: Both vertebral arteries patent to the basilar. The basilar is patent. Posterior cerebral arteries are patent bilaterally. Limited vessel detail. Venous sinuses: Patent Anatomic variants: None Delayed phase: Not performed. IMPRESSION: There is limited information on the study due to poor opacification of the arterial circulation. Repeat study is suggested. Mild calcific stenosis of the internal carotid artery bilaterally. No large vessel occlusion identified. These results were called by telephone at the time of interpretation on 07/22/2015 at 1:47 pm to Dr. Marvel Plan , who verbally acknowledged these results. Electronically Signed   By: Marlan Palau M.D.   On: 07/22/2015 13:48   Mr Brain Wo Contrast  07/23/2015  CLINICAL DATA:  70 year old diabetic hypertensive male with altered mental status. Initial  evaluation revealed hyperglycemia. Subsequent encounter. EXAM: MRI HEAD WITHOUT CONTRAST TECHNIQUE: Multiplanar, multiecho pulse sequences of the brain and surrounding structures were obtained without intravenous contrast. COMPARISON:  07/22/2015 head CT and CT angiogram. FINDINGS: Patient was not able to complete the entire examination. Altered signal intensity of medial aspect of the left temporal lobe involving hippocampus and amygdala is a nonspecific finding. Herpes encephalitis needs to be considered and excluded based on clinical results. Seizure activity can cause a similar appearance. Result of metabolic abnormality (elevated or depressed glucose levels) also a consideration. This does not appear be an acute infarct. Remote very small thalamic infarcts and right paracentral pontine infarct. Mild chronic nonspecific white matter changes. Mild to moderate global atrophy without hydrocephalus. No intracranial hemorrhage. No intracranial mass lesion noted on this unenhanced exam. Major intracranial vascular structures are patent. Mild mucosal thickening ethmoid sinus air cells. Post lens replacement without acute abnormality noted. IMPRESSION: Patient was not able to complete the entire examination. Altered signal intensity of medial aspect of the left temporal lobe involving hippocampus and amygdala is a nonspecific finding. Herpes encephalitis needs to be considered and excluded based on clinical results. Seizure activity can cause a similar appearance. Result of metabolic abnormality (elevated or depressed glucose levels) also  a consideration. This does not appear to be an acute infarct. Remote very small thalamic infarcts and right paracentral pontine infarct. Mild to moderate global atrophy without hydrocephalus. These results were called by telephone at the time of interpretation on 07/23/2015 at 4:00 pm to Dr. Marvel Plan , who verbally acknowledged these results. Electronically Signed   By: Lacy Duverney  M.D.   On: 07/23/2015 16:04   Dg Chest Port 1 View  07/24/2015  CLINICAL DATA:  Respiratory failure. EXAM: PORTABLE CHEST 1 VIEW COMPARISON:  07/22/2015. FINDINGS: Interim extubation and removal of NG tube. Cardiomegaly with normal pulmonary vascularity . Low lung volumes with mild bibasilar atelectasis. No pleural effusion or pneumothorax. IMPRESSION: 1. Interim removal of endotracheal and NG tubes. 2.  Cardiomegaly.  No evidence of congestive heart failure. 3. Low lung volumes with mild bibasilar atelectasis. Electronically Signed   By: Maisie Fus  Register   On: 07/24/2015 07:02   Dg Chest Port 1 View  07/22/2015  CLINICAL DATA:  Status post CVA, acute renal insufficiency, coronary artery disease, diabetes. EXAM: PORTABLE CHEST 1 VIEW COMPARISON:  Portable chest x-ray of July 21, 2015 FINDINGS: The lungs are mildly hypoinflated. The retrocardiac region on the left is more dense today. The cardiac silhouette is larger today. The central pulmonary vascularity is prominent. The endotracheal tube tip lies approximately 3.8 cm above the carina. IMPRESSION: Interval increase in the conspicuity of the cardiac silhouette and pulmonary vascularity suggests mild CHF. There is no significant pleural effusion and no evidence of pneumonia. Developing atelectasis in the left lower lobe is suspected however. Electronically Signed   By: Max Romano  [place] M.D.   On: 07/22/2015 07:31   Dg Chest Portable 1 View  07/21/2015  CLINICAL DATA:  Post intubation. EXAM: PORTABLE CHEST 1 VIEW COMPARISON:  05/29/2015 FINDINGS: Endotracheal tube has tip 2.8 cm above the carina. Lungs are hypoinflated with bilateral perihilar opacification likely mild interstitial edema and less likely infection. No evidence of effusion or pneumothorax. Mild stable cardiomegaly. Coronary stent over the left heart. Remainder of the exam is unchanged. IMPRESSION: Perihilar opacification likely interstitial edema and less likely infection. Mild stable  cardiomegaly. Endotracheal tube with tip 2.8 cm above the carina. Electronically Signed   By: Elberta Fortis M.D.   On: 07/21/2015 21:26   US Abdomen Limited Ruq  07/25/2015  CLINICAL DATA:  Patient with history of cirrhosis.  Morbid obesity. EXAM: US ABDOMEN LIMITED - RIGHT UPPER QUADRANT COMPARISON:  Renal ultrasound 01/01/2014. FINDINGS: Gallbladder: No gallstones or wall thickening visualized. No sonographic Murphy sign noted by sonographer. Common bile duct: Diameter: 4 mm Liver: The liver is heterogeneous in echogenicity and nodular in contour. IMPRESSION: Heterogeneous echogenicity of the liver and mildly nodular contour compatible with cirrhosis. Electronically Signed   By: Annia Belt M.D.   On: 07/25/2015 16:28     Microbiology: Recent Results (from the past 240 hour(s))  MRSA PCR Screening     Status: Abnormal   Collection Time: 07/22/15 12:24 AM  Result Value Ref Range Status   MRSA by PCR POSITIVE (A) NEGATIVE Final    Comment:        The GeneXpert MRSA Assay (FDA approved for NASAL specimens only), is one component of a comprehensive MRSA colonization surveillance program. It is not intended to diagnose MRSA infection nor to guide or monitor treatment for MRSA infections. RESULT CALLED TO, READ BACK BY AND VERIFIED WITH: CROFT,S RN [id number] AT 0238 SKEEN,P   Urine culture     Status: None  Collection Time: 07/25/15 10:51 AM  Result Value Ref Range Status   Specimen Description URINE, CATHETERIZED  Final   Special Requests NONE  Final   Culture NO GROWTH  Final   Report Status 07/26/2015 FINAL  Final     Labs: Basic Metabolic Panel:  Recent Labs Lab 07/22/15 0330 07/24/15 0230 07/25/15 0756 07/26/15 0457 07/27/15 0704  NA 132* 131* 132* 130* 130*  K 5.8* 4.2 3.9 4.0 4.2  CL 97* 100* 99* 99* 97*  CO2 24 24 26 26 25   GLUCOSE 540* 221* 213* 244* 243*  BUN 35* 20 18 17 18   CREATININE 1.68* 0.93 1.06 0.98 0.97  CALCIUM 8.3* 8.3* 8.9 8.6* 9.0   Liver  Function Tests:  Recent Labs Lab 07/21/15 1927 07/22/15 0330 07/25/15 0756 07/26/15 0457  AST 103* 71* 68* 71*  ALT 88* 75* 61 66*  ALKPHOS 259* 199* 175* 182*  BILITOT 1.1 1.1 1.0 0.7  PROT 7.2 6.0* 6.4* 5.9*  ALBUMIN 3.5 2.8* 2.8* 2.5*   No results for input(s): LIPASE, AMYLASE in the last 168 hours.  Recent Labs Lab 07/21/15 1928 07/23/15 1217 07/25/15 0756 07/26/15 0457 07/27/15 0704  AMMONIA 65* 74* 50* 58* 36*   CBC:  Recent Labs Lab 07/21/15 1927  07/22/15 0330 07/22/15 0707 07/22/15 1133 07/22/15 1902 07/24/15 0230 07/25/15 0756  WBC 8.4  < > 15.2* 13.9* 15.0* 21.0* 10.8* 8.3  NEUTROABS 6.0  --  12.5*  --   --   --   --   --   HGB 13.6  < > 11.2* 10.7* 10.6* 11.2* 9.7* 10.9*  HCT 41.1  < > 35.1* 33.9* 33.2* 34.8* 29.8* 33.8*  MCV 86.8  < > 86.7 85.8 85.1 86.6 84.9 84.5  PLT 155  < > 175 143* 157 154 172 176  < > = values in this interval not displayed. Cardiac Enzymes:  Recent Labs Lab 07/21/15 1927  TROPONINI 0.03   BNP: Invalid input(s): POCBNP CBG:  Recent Labs Lab 07/26/15 1147 07/26/15 1653 07/26/15 2124 07/27/15 0626 07/27/15 1140  GLUCAP 262* 286* 307* 268* 226*    Time coordinating discharge:  Greater than 30 minutes  Signed:  Clarine Elrod, DO [facility] Pager: [phone] 07/27/2015, 11:54 AM

## 2015-07-26 NOTE — Progress Notes (Signed)
Occupational Therapy Treatment Patient Details Name: Jay [name] MRN: [id number] DOB: 19-Jan-1946 Today's Date: 07/26/2015    History of present illness 54M who presented to [place] 6/18 with complaints of altered mental status. Initial evaluation revealed marked hyperglycemia, but during evaluation he reportedly became more altered, had a rightward gaze preference and dropped his sats. He was intubated and his case was discussed with tele-neurology. He was ultimately given tPA out of concern for a brainstem stroke. After administration of tPA he reportedly had about 600cc of bloody OGT output. On arrival, he is intubated, sedated on propofol. Extubated 6/19 without difficulty.  MRI - for acute infarct.    OT comments  Pt participated well in OT.  Set up/supervision for LB adls and standing grooming provided.  Cues for walker safety  Follow Up Recommendations  SNF (unless he has 24/7, then [facility])    Equipment Recommendations  Tub/shower bench    Recommendations for Other Services      Precautions / Restrictions Precautions Precautions: Fall Restrictions Weight Bearing Restrictions: No       Mobility Bed Mobility         Supine to sit: Supervision        Transfers   Equipment used: Rolling walker (2 wheeled) Transfers: Sit to/from Stand Sit to Stand: Supervision         General transfer comment: cues for UE placement    Balance     Sitting balance-Leahy Scale: Good       Standing balance-Leahy Scale: Fair                     ADL       Grooming: Oral care;Brushing hair;Supervision/safety;Standing       Lower Body Bathing: Supervison/ safety;Sit to/from stand       Lower Body Dressing: Supervision/safety;Sit to/from stand                 General ADL Comments: performed LB adl and stood to brush hair and teeth.  cues for safety with RW; hand placement.  Pt did not need to use bathroom at this time.  Supervision ambulating to  sink and chair      Vision                     Perception     Praxis      Cognition   Behavior During Therapy: [facility] for tasks assessed/performed Overall Cognitive Status: No family/caregiver present to determine baseline cognitive functioning            Safety/Judgement: Decreased awareness of safety   Problem Solving: Requires verbal cues      Extremity/Trunk Assessment               Exercises     Shoulder Instructions       General Comments      Pertinent Vitals/ Pain       Pain Assessment: No/denies pain  Home Living                                          Prior Functioning/Environment              Frequency Min 2X/week     Progress Toward Goals  OT Goals(current goals can now be found in the care plan section)  Progress towards OT goals: Progressing toward goals  Plan Discharge plan needs to be updated    Co-evaluation                 End of Session     Activity Tolerance Patient tolerated treatment well   Patient Left in chair;with call bell/phone within reach;with chair alarm set   Nurse Communication          Time: [phone] OT Time Calculation (min): 20 min  Charges: OT General Charges $OT Visit: 1 Procedure OT Treatments $Self Care/Home Management : 8-22 mins  Mariza Bourget 07/26/2015, 9:42 AM   Marica Otter, OTR/L [phone] 07/26/2015

## 2015-07-26 NOTE — Progress Notes (Signed)
Inpatient Diabetes Program Recommendations  AACE/ADA: New Consensus Statement on Inpatient Glycemic Control (2015)  Target Ranges:  Prepandial:   less than 140 mg/dL      Peak postprandial:   less than 180 mg/dL (1-2 hours)      Critically ill patients:  140 - 180 mg/dL  Results for REACE, BREHMER (MRN [id number]) as of 07/26/2015 08:47  Ref. Range 07/25/2015 14:11 07/25/2015 17:33 07/25/2015 20:20 07/26/2015 00:35 07/26/2015 06:00  Glucose-Capillary Latest Ref Range: 65-99 mg/dL 295 (H) 621 (H) 308 (H) 173 (H) 223 (H)     Review of Glycemic Control  Diabetes history: DM 2 Outpatient Diabetes medications: Levemir 85 QHS + Tradjenta 5 QD + Glipizide 5 bid + Amaryl 8 QD Current orders for Inpatient glycemic control: Levemir 30 units bid + Novolog 6 units meal coverage tid + Novolog correction 0-20 units tid + 0-5 units hs  Inpatient Diabetes Program Recommendations:  Please consider increase in Levemir to 35 units bid and meal coverage to 10 units tid with meals.  Thank you, Billy Fischer. Amylynn Fano, RN, MSN, CDE Inpatient Glycemic Control Team Team Pager [phone] (8am-5pm) 07/26/2015 8:53 AM

## 2015-07-26 NOTE — Progress Notes (Signed)
Physical Therapy Treatment Patient Details Name: Jay [name] MRN: [id number] DOB: [birth year] Today's Date: 07/26/2015    History of Present Illness 47M who presented to [place] 6/18 with complaints of altered mental status. Initial evaluation revealed marked hyperglycemia, but during evaluation he reportedly became more altered, had a rightward gaze preference and dropped his sats. He was intubated and his case was discussed with tele-neurology. He was ultimately given tPA out of concern for a brainstem stroke. After administration of tPA he reportedly had about 600cc of bloody OGT output. On arrival, he is intubated, sedated on propofol. Extubated 6/19 without difficulty.  MRI - for acute infarct.     PT Comments    Improved significantly.  Still has some instabilities, but challenges to balance do not produce any overt LOB.  Follow Up Recommendations  SNF;Supervision for mobility/OOB     Equipment Recommendations  Other (comment) (TBA, but likely none)    Recommendations for Other Services       Precautions / Restrictions Precautions Precautions: Fall Restrictions Weight Bearing Restrictions: No    Mobility  Bed Mobility         Supine to sit: Supervision     General bed mobility comments: OOB in chair  Transfers Overall transfer level: Needs assistance Equipment used: Rolling walker (2 wheeled);None Transfers: Sit to/from Stand Sit to Stand: Supervision Stand pivot transfers: Supervision       General transfer comment: used UE's appropriately for safety  Ambulation/Gait Ambulation/Gait assistance: Supervision Ambulation Distance (Feet): 300 Feet Assistive device: None;Rolling walker (2 wheeled) Gait Pattern/deviations: Step-through pattern Gait velocity: slower, can change speed, but not significantly Gait velocity interpretation: at or above normal speed for age/gender General Gait Details: generally steady overall without use of the RW.   Challenged by scanning, abrupt turns, backing up, stepping over obstacles, etc.  Pt with small deviations, but no overt LOB,   Stairs Stairs: Yes Stairs assistance: Supervision Stair Management: One rail Right;Alternating pattern;Step to pattern;Forwards Number of Stairs: 4 General stair comments: generally steady if uses rail  Wheelchair Mobility    Modified Rankin (Stroke Patients Only) Modified Rankin (Stroke Patients Only) Pre-Morbid Rankin Score: Slight disability Modified Rankin: Moderate disability     Balance Overall balance assessment: Needs assistance Sitting-balance support: No upper extremity supported Sitting balance-Leahy Scale: Good     Standing balance support: No upper extremity supported Standing balance-Leahy Scale: Fair                      Cognition Arousal/Alertness: Awake/alert Behavior During Therapy: WFL for tasks assessed/performed Overall Cognitive Status: Within Functional Limits for tasks assessed         Following Commands: Follows multi-step commands with increased time;Follows one step commands consistently Safety/Judgement: Decreased awareness of safety   Problem Solving: Requires verbal cues General Comments: Appears much improved over evaluation    Exercises      General Comments        Pertinent Vitals/Pain Pain Assessment: No/denies pain    Home Living                      Prior Function            PT Goals (current goals can now be found in the care plan section) Acute Rehab PT Goals Patient Stated Goal: go home PT Goal Formulation: With patient Time For Goal Achievement: 08/07/15 Potential to Achieve Goals: Good Progress towards PT goals: Progressing toward goals    Frequency  Min 3X/week    PT Plan Discharge plan needs to be updated    Co-evaluation             End of Session   Activity Tolerance: Patient tolerated treatment well Patient left: in chair;with call bell/phone  within reach;with chair alarm set     Time: [phone] PT Time Calculation (min) (ACUTE ONLY): 23 min  Charges:  $Gait Training: 8-22 mins $Therapeutic Activity: 8-22 mins                    G Codes:      Waniya Hoglund, Eliseo Gum 07/26/2015, 12:57 PM 07/26/2015  Elsinore Bing, PT [phone]  (pager)

## 2015-07-27 DIAGNOSIS — E1165 Type 2 diabetes mellitus with hyperglycemia: Secondary | ICD-10-CM | POA: Diagnosis not present

## 2015-07-27 DIAGNOSIS — K7290 Hepatic failure, unspecified without coma: Secondary | ICD-10-CM | POA: Diagnosis not present

## 2015-07-27 DIAGNOSIS — E0844 Diabetes mellitus due to underlying condition with diabetic amyotrophy: Secondary | ICD-10-CM | POA: Diagnosis not present

## 2015-07-27 DIAGNOSIS — I639 Cerebral infarction, unspecified: Secondary | ICD-10-CM | POA: Diagnosis not present

## 2015-07-27 DIAGNOSIS — E7220 Disorder of urea cycle metabolism, unspecified: Secondary | ICD-10-CM | POA: Diagnosis not present

## 2015-07-27 DIAGNOSIS — J9690 Respiratory failure, unspecified, unspecified whether with hypoxia or hypercapnia: Secondary | ICD-10-CM | POA: Diagnosis not present

## 2015-07-27 DIAGNOSIS — G9340 Encephalopathy, unspecified: Secondary | ICD-10-CM | POA: Diagnosis not present

## 2015-07-27 DIAGNOSIS — R279 Unspecified lack of coordination: Secondary | ICD-10-CM | POA: Diagnosis not present

## 2015-07-27 DIAGNOSIS — I10 Essential (primary) hypertension: Secondary | ICD-10-CM | POA: Diagnosis not present

## 2015-07-27 DIAGNOSIS — N189 Chronic kidney disease, unspecified: Secondary | ICD-10-CM | POA: Diagnosis not present

## 2015-07-27 DIAGNOSIS — R2689 Other abnormalities of gait and mobility: Secondary | ICD-10-CM | POA: Diagnosis not present

## 2015-07-27 DIAGNOSIS — M6281 Muscle weakness (generalized): Secondary | ICD-10-CM | POA: Diagnosis not present

## 2015-07-27 DIAGNOSIS — R911 Solitary pulmonary nodule: Secondary | ICD-10-CM | POA: Diagnosis not present

## 2015-07-27 DIAGNOSIS — K760 Fatty (change of) liver, not elsewhere classified: Secondary | ICD-10-CM | POA: Diagnosis not present

## 2015-07-27 DIAGNOSIS — J9600 Acute respiratory failure, unspecified whether with hypoxia or hypercapnia: Secondary | ICD-10-CM | POA: Diagnosis not present

## 2015-07-27 LAB — BASIC METABOLIC PANEL
ANION GAP: 8 (ref 5–15)
BUN: 18 mg/dL (ref 6–20)
CALCIUM: 9 mg/dL (ref 8.9–10.3)
CO2: 25 mmol/L (ref 22–32)
CREATININE: 0.97 mg/dL (ref 0.61–1.24)
Chloride: 97 mmol/L — ABNORMAL LOW (ref 101–111)
Glucose, Bld: 243 mg/dL — ABNORMAL HIGH (ref 65–99)
Potassium: 4.2 mmol/L (ref 3.5–5.1)
SODIUM: 130 mmol/L — AB (ref 135–145)

## 2015-07-27 LAB — AMMONIA: AMMONIA: 36 umol/L — AB (ref 9–35)

## 2015-07-27 LAB — GLUCOSE, CAPILLARY
GLUCOSE-CAPILLARY: 226 mg/dL — AB (ref 65–99)
Glucose-Capillary: 268 mg/dL — ABNORMAL HIGH (ref 65–99)

## 2015-07-27 MED ORDER — INSULIN DETEMIR 100 UNIT/ML ~~LOC~~ SOLN: 50.0000 [IU] | Freq: Two times a day (BID) | SUBCUTANEOUS | Status: DC

## 2015-07-27 MED ORDER — INSULIN DETEMIR 100 UNIT/ML ~~LOC~~ SOLN
50.0000 [IU] | Freq: Two times a day (BID) | SUBCUTANEOUS | Status: DC
  Filled 2015-07-27: qty 0.5

## 2015-07-27 MED ORDER — METOPROLOL TARTRATE 25 MG PO TABS: 12.5000 mg | ORAL_TABLET | Freq: Two times a day (BID) | ORAL | Status: DC

## 2015-07-27 MED ORDER — INSULIN ASPART 100 UNIT/ML ~~LOC~~ SOLN: 8.0000 [IU] | Freq: Three times a day (TID) | SUBCUTANEOUS | Status: DC

## 2015-07-27 MED ORDER — LACTULOSE 10 GM/15ML PO SOLN: 30.0000 g | Freq: Three times a day (TID) | ORAL | Status: DC

## 2015-07-27 MED ORDER — METOPROLOL TARTRATE 12.5 MG HALF TABLET
12.5000 mg | ORAL_TABLET | Freq: Two times a day (BID) | ORAL | Status: DC
  Administered 2015-07-27: 12.5 mg via ORAL
  Filled 2015-07-27: qty 1

## 2015-07-27 NOTE — Clinical Social Work Note (Signed)
Clinical Social Worker facilitated patient discharge including contacting patient family (left message with pt's son, Molly Maduro) and facility to confirm patient discharge plans.  Clinical information faxed to facility and family agreeable with plan.  CSW arranged ambulance transport via [facility] to [facility] [place].  RN to call report prior to discharge.  Clinical Social Worker will sign off for now as social work intervention is no longer needed. Please consult [place] again if new need arises.  Derenda Fennel, MSW, LCSWA [phone] 07/27/2015 11:55 AM

## 2015-07-27 NOTE — Clinical Social Work Placement (Signed)
   CLINICAL SOCIAL WORK PLACEMENT  NOTE  Date:  07/27/2015  Patient Details  Name: ABDULKARIM BOBBIT MRN: [id number] Date of Birth: 1945-09-11  Clinical Social Work is seeking post-discharge placement for this patient at the Skilled  Nursing Facility level of care (*CSW will initial, date and re-position this form in  chart as items are completed):  Yes   Patient/family provided with [place] Clinical Social Work Department's list of facilities offering this level of care within the geographic area requested by the patient (or if unable, by the patient's family).  Yes   Patient/family informed of their freedom to choose among providers that offer the needed level of care, that participate in Medicare, Medicaid or managed care program needed by the patient, have an available bed and are willing to accept the patient.  Yes   Patient/family informed of 's ownership interest in [facility] and [facility], as well as of the fact that they are under no obligation to receive care at these facilities.  PASRR submitted to EDS on 07/24/15     PASRR number received on 07/24/15     Existing PASRR number confirmed on       FL2 transmitted to all facilities in geographic area requested by pt/family on 07/24/15     FL2 transmitted to all facilities within larger geographic area on       Patient informed that his/her managed care company has contracts with or will negotiate with certain facilities, including the following:        Yes   Patient/family informed of bed offers received.  Patient chooses bed at  [facility])     Physician recommends and patient chooses bed at      Patient to be transferred to  [facility]) on 07/27/15.  Patient to be transferred to facility by  [facility] )     Patient family notified on 07/27/15 of transfer.  Name of family member notified:   (Patient's son, Rockney )     PHYSICIAN Please sign FL2, Please prepare priority  discharge summary, including medications     Additional Comment:    _______________________________________________ Vaughan Browner, LCSW 07/27/2015, 11:33 AM

## 2015-07-27 NOTE — Progress Notes (Signed)
Pt d/c to snf by ambulance. Assessment stable. All questions answered 

## 2015-07-29 DIAGNOSIS — I6789 Other cerebrovascular disease: Secondary | ICD-10-CM | POA: Diagnosis not present

## 2015-07-29 DIAGNOSIS — J988 Other specified respiratory disorders: Secondary | ICD-10-CM | POA: Diagnosis not present

## 2015-07-29 DIAGNOSIS — G9349 Other encephalopathy: Secondary | ICD-10-CM | POA: Diagnosis not present

## 2015-07-29 DIAGNOSIS — E0844 Diabetes mellitus due to underlying condition with diabetic amyotrophy: Secondary | ICD-10-CM | POA: Diagnosis not present

## 2015-08-04 DIAGNOSIS — Z8673 Personal history of transient ischemic attack (TIA), and cerebral infarction without residual deficits: Secondary | ICD-10-CM | POA: Diagnosis not present

## 2015-08-04 DIAGNOSIS — I2510 Atherosclerotic heart disease of native coronary artery without angina pectoris: Secondary | ICD-10-CM | POA: Diagnosis not present

## 2015-08-04 DIAGNOSIS — E1122 Type 2 diabetes mellitus with diabetic chronic kidney disease: Secondary | ICD-10-CM | POA: Diagnosis not present

## 2015-08-04 DIAGNOSIS — Z955 Presence of coronary angioplasty implant and graft: Secondary | ICD-10-CM | POA: Diagnosis not present

## 2015-08-04 DIAGNOSIS — Z794 Long term (current) use of insulin: Secondary | ICD-10-CM | POA: Diagnosis not present

## 2015-08-04 DIAGNOSIS — E1165 Type 2 diabetes mellitus with hyperglycemia: Secondary | ICD-10-CM | POA: Diagnosis not present

## 2015-08-04 DIAGNOSIS — K7460 Unspecified cirrhosis of liver: Secondary | ICD-10-CM | POA: Diagnosis not present

## 2015-08-04 DIAGNOSIS — N182 Chronic kidney disease, stage 2 (mild): Secondary | ICD-10-CM | POA: Diagnosis not present

## 2015-08-04 DIAGNOSIS — Z7982 Long term (current) use of aspirin: Secondary | ICD-10-CM | POA: Diagnosis not present

## 2015-08-04 DIAGNOSIS — I129 Hypertensive chronic kidney disease with stage 1 through stage 4 chronic kidney disease, or unspecified chronic kidney disease: Secondary | ICD-10-CM | POA: Diagnosis not present

## 2015-08-04 DIAGNOSIS — R2681 Unsteadiness on feet: Secondary | ICD-10-CM | POA: Diagnosis not present

## 2015-08-04 DIAGNOSIS — M6281 Muscle weakness (generalized): Secondary | ICD-10-CM | POA: Diagnosis not present

## 2015-08-05 ENCOUNTER — Observation Stay
Admission: EM | Admit: 2015-08-05 | Discharge: 2015-08-08 | Disposition: A | Discharge status: Home / Self Care | Payer: PPO | Attending: Internal Medicine | Admitting: Internal Medicine

## 2015-08-05 ENCOUNTER — Emergency Department: Payer: PPO

## 2015-08-05 ENCOUNTER — Encounter: Payer: Self-pay | Admitting: *Deleted

## 2015-08-05 DIAGNOSIS — Z9841 Cataract extraction status, right eye: Secondary | ICD-10-CM | POA: Diagnosis not present

## 2015-08-05 DIAGNOSIS — I10 Essential (primary) hypertension: Secondary | ICD-10-CM | POA: Diagnosis not present

## 2015-08-05 DIAGNOSIS — Z8249 Family history of ischemic heart disease and other diseases of the circulatory system: Secondary | ICD-10-CM | POA: Insufficient documentation

## 2015-08-05 DIAGNOSIS — N400 Enlarged prostate without lower urinary tract symptoms: Secondary | ICD-10-CM | POA: Diagnosis not present

## 2015-08-05 DIAGNOSIS — R05 Cough: Secondary | ICD-10-CM | POA: Diagnosis not present

## 2015-08-05 DIAGNOSIS — F339 Major depressive disorder, recurrent, unspecified: Secondary | ICD-10-CM | POA: Diagnosis not present

## 2015-08-05 DIAGNOSIS — G4730 Sleep apnea, unspecified: Secondary | ICD-10-CM | POA: Diagnosis not present

## 2015-08-05 DIAGNOSIS — I2510 Atherosclerotic heart disease of native coronary artery without angina pectoris: Secondary | ICD-10-CM | POA: Diagnosis present

## 2015-08-05 DIAGNOSIS — R569 Unspecified convulsions: Principal | ICD-10-CM

## 2015-08-05 DIAGNOSIS — D649 Anemia, unspecified: Secondary | ICD-10-CM | POA: Diagnosis not present

## 2015-08-05 DIAGNOSIS — J309 Allergic rhinitis, unspecified: Secondary | ICD-10-CM | POA: Insufficient documentation

## 2015-08-05 DIAGNOSIS — Z955 Presence of coronary angioplasty implant and graft: Secondary | ICD-10-CM | POA: Insufficient documentation

## 2015-08-05 DIAGNOSIS — Z794 Long term (current) use of insulin: Secondary | ICD-10-CM | POA: Diagnosis not present

## 2015-08-05 DIAGNOSIS — E1165 Type 2 diabetes mellitus with hyperglycemia: Secondary | ICD-10-CM | POA: Diagnosis not present

## 2015-08-05 DIAGNOSIS — Z8673 Personal history of transient ischemic attack (TIA), and cerebral infarction without residual deficits: Secondary | ICD-10-CM | POA: Insufficient documentation

## 2015-08-05 DIAGNOSIS — Z9842 Cataract extraction status, left eye: Secondary | ICD-10-CM | POA: Insufficient documentation

## 2015-08-05 DIAGNOSIS — Z6835 Body mass index (BMI) 35.0-35.9, adult: Secondary | ICD-10-CM | POA: Diagnosis not present

## 2015-08-05 DIAGNOSIS — I25110 Atherosclerotic heart disease of native coronary artery with unstable angina pectoris: Secondary | ICD-10-CM | POA: Diagnosis not present

## 2015-08-05 DIAGNOSIS — Z7982 Long term (current) use of aspirin: Secondary | ICD-10-CM | POA: Diagnosis not present

## 2015-08-05 DIAGNOSIS — K219 Gastro-esophageal reflux disease without esophagitis: Secondary | ICD-10-CM | POA: Insufficient documentation

## 2015-08-05 DIAGNOSIS — E785 Hyperlipidemia, unspecified: Secondary | ICD-10-CM | POA: Diagnosis present

## 2015-08-05 DIAGNOSIS — K760 Fatty (change of) liver, not elsewhere classified: Secondary | ICD-10-CM | POA: Insufficient documentation

## 2015-08-05 DIAGNOSIS — R4182 Altered mental status, unspecified: Secondary | ICD-10-CM | POA: Diagnosis not present

## 2015-08-05 DIAGNOSIS — Z818 Family history of other mental and behavioral disorders: Secondary | ICD-10-CM | POA: Insufficient documentation

## 2015-08-05 DIAGNOSIS — Z806 Family history of leukemia: Secondary | ICD-10-CM | POA: Diagnosis not present

## 2015-08-05 DIAGNOSIS — E11319 Type 2 diabetes mellitus with unspecified diabetic retinopathy without macular edema: Secondary | ICD-10-CM | POA: Diagnosis not present

## 2015-08-05 DIAGNOSIS — Z832 Family history of diseases of the blood and blood-forming organs and certain disorders involving the immune mechanism: Secondary | ICD-10-CM | POA: Insufficient documentation

## 2015-08-05 DIAGNOSIS — Z888 Allergy status to other drugs, medicaments and biological substances status: Secondary | ICD-10-CM | POA: Diagnosis not present

## 2015-08-05 LAB — COMPREHENSIVE METABOLIC PANEL
ALBUMIN: 3.8 g/dL (ref 3.5–5.0)
ALT: 58 U/L (ref 17–63)
ANION GAP: 10 (ref 5–15)
AST: 45 U/L — ABNORMAL HIGH (ref 15–41)
Alkaline Phosphatase: 169 U/L — ABNORMAL HIGH (ref 38–126)
BILIRUBIN TOTAL: 0.6 mg/dL (ref 0.3–1.2)
BUN: 20 mg/dL (ref 6–20)
CO2: 27 mmol/L (ref 22–32)
Calcium: 9.1 mg/dL (ref 8.9–10.3)
Chloride: 94 mmol/L — ABNORMAL LOW (ref 101–111)
Creatinine, Ser: 1.34 mg/dL — ABNORMAL HIGH (ref 0.61–1.24)
GFR calc Af Amer: >60 mL/min (ref >60)
GFR calc non Af Amer: 53 mL/min — ABNORMAL LOW (ref >60)
GLUCOSE: 158 mg/dL — AB (ref 65–99)
POTASSIUM: 4 mmol/L (ref 3.5–5.1)
Sodium: 131 mmol/L — ABNORMAL LOW (ref 135–145)
TOTAL PROTEIN: 7.7 g/dL (ref 6.5–8.1)

## 2015-08-05 LAB — CBC WITH DIFFERENTIAL/PLATELET
BASOS PCT: 0 %
Basophils Absolute: 0.1 10*3/uL (ref 0–0.1)
EOS ABS: 0.1 10*3/uL (ref 0–0.7)
EOS PCT: 1 %
HEMATOCRIT: 37.5 % — AB (ref 40.0–52.0)
Hemoglobin: 13 g/dL (ref 13.0–18.0)
Lymphocytes Relative: 11 %
Lymphs Abs: 1.7 10*3/uL (ref 1.0–3.6)
MCH: 29.5 pg (ref 26.0–34.0)
MCHC: 34.6 g/dL (ref 32.0–36.0)
MCV: 85.2 fL (ref 80.0–100.0)
MONO ABS: 1.5 10*3/uL — AB (ref 0.2–1.0)
MONOS PCT: 10 %
NEUTROS ABS: 11.5 10*3/uL — AB (ref 1.4–6.5)
Neutrophils Relative %: 78 %
PLATELETS: 229 10*3/uL (ref 150–440)
RBC: 4.4 MIL/uL (ref 4.40–5.90)
RDW: 18.9 % — AB (ref 11.5–14.5)
WBC: 14.8 10*3/uL — ABNORMAL HIGH (ref 3.8–10.6)

## 2015-08-05 LAB — AMMONIA: AMMONIA: 23 umol/L (ref 9–35)

## 2015-08-05 LAB — TROPONIN I

## 2015-08-05 LAB — GLUCOSE, CAPILLARY: GLUCOSE-CAPILLARY: 175 mg/dL — AB (ref 65–99)

## 2015-08-05 LAB — ETHANOL: Alcohol, Ethyl (B): <5 mg/dL (ref <5)

## 2015-08-05 NOTE — ED Notes (Signed)
Pt brought to room 26 after ct scan.  Pt alert.  Pt confused.

## 2015-08-05 NOTE — ED Notes (Signed)
Pt reports driving today to his son's place of work.   Pt had a shaking spell.  Pt was able to drive, walk and talk.  Tonight pt was at fireworks and had spell of glassy eyes and confusion.  No headache.  Pt had diff  walking afterwards.  No shaking spell tonight.  Pt alert now.  Speech clear.  No diff eating , drinking. Pt passed stroke swallow scale.

## 2015-08-05 NOTE — ED Notes (Signed)
Per dr Pershing Proud, cancel code stroke

## 2015-08-05 NOTE — ED Notes (Signed)
Pt alert.  No headache.  Speech clear.  Family with pt.  Iv in placed.

## 2015-08-05 NOTE — ED Notes (Signed)
FIRST NURSE NOTE: Patient presents to STAT desk accompanied by his son. Son reports that patient is 2 weeks s/p CVA; intubated and treated with TPA. Patient and son were at the fireworks tonight when patient became acutely confused and "got that glazed over look like he got before". Code stroke activated and patient taken directly to CT for STAT scan; radiology made aware that ED staff en route with a patient for CODE stroke head scan. This RN spoke with attending provider while patient in CT with triage nurse Jon Gills, RN).

## 2015-08-05 NOTE — ED Notes (Signed)
MD at bedside. 

## 2015-08-06 DIAGNOSIS — I2510 Atherosclerotic heart disease of native coronary artery without angina pectoris: Secondary | ICD-10-CM | POA: Diagnosis not present

## 2015-08-06 DIAGNOSIS — I10 Essential (primary) hypertension: Secondary | ICD-10-CM | POA: Diagnosis not present

## 2015-08-06 DIAGNOSIS — E119 Type 2 diabetes mellitus without complications: Secondary | ICD-10-CM | POA: Diagnosis not present

## 2015-08-06 DIAGNOSIS — R569 Unspecified convulsions: Secondary | ICD-10-CM | POA: Diagnosis not present

## 2015-08-06 LAB — URINE DRUG SCREEN, QUALITATIVE (ARMC ONLY)
Amphetamines, Ur Screen: NOT DETECTED
Barbiturates, Ur Screen: NOT DETECTED
Benzodiazepine, Ur Scrn: NOT DETECTED
Cannabinoid 50 Ng, Ur ~~LOC~~: NOT DETECTED
Cocaine Metabolite,Ur ~~LOC~~: NOT DETECTED
MDMA (Ecstasy)Ur Screen: NOT DETECTED
Methadone Scn, Ur: NOT DETECTED
Opiate, Ur Screen: NOT DETECTED
Phencyclidine (PCP) Ur S: NOT DETECTED
Tricyclic, Ur Screen: NOT DETECTED

## 2015-08-06 LAB — URINALYSIS COMPLETE WITH MICROSCOPIC (ARMC ONLY)
BACTERIA UA: NONE SEEN
Bilirubin Urine: NEGATIVE
Glucose, UA: 50 mg/dL — AB
HGB URINE DIPSTICK: NEGATIVE
Ketones, ur: NEGATIVE mg/dL
Nitrite: NEGATIVE
PH: 6 (ref 5.0–8.0)
Protein, ur: NEGATIVE mg/dL
Specific Gravity, Urine: 1.013 (ref 1.005–1.030)

## 2015-08-06 LAB — BASIC METABOLIC PANEL
ANION GAP: 7 (ref 5–15)
BUN: 18 mg/dL (ref 6–20)
CHLORIDE: 97 mmol/L — AB (ref 101–111)
CO2: 28 mmol/L (ref 22–32)
CREATININE: 0.86 mg/dL (ref 0.61–1.24)
Calcium: 8.4 mg/dL — ABNORMAL LOW (ref 8.9–10.3)
GFR calc non Af Amer: >60 mL/min (ref >60)
Glucose, Bld: 85 mg/dL (ref 65–99)
POTASSIUM: 3.9 mmol/L (ref 3.5–5.1)
SODIUM: 132 mmol/L — AB (ref 135–145)

## 2015-08-06 LAB — CBC
HEMATOCRIT: 33 % — AB (ref 40.0–52.0)
HEMOGLOBIN: 11.4 g/dL — AB (ref 13.0–18.0)
MCH: 29.7 pg (ref 26.0–34.0)
MCHC: 34.5 g/dL (ref 32.0–36.0)
MCV: 86 fL (ref 80.0–100.0)
PLATELETS: 207 10*3/uL (ref 150–440)
RBC: 3.83 MIL/uL — AB (ref 4.40–5.90)
RDW: 18.7 % — ABNORMAL HIGH (ref 11.5–14.5)
WBC: 12.3 10*3/uL — AB (ref 3.8–10.6)

## 2015-08-06 LAB — HEMOGLOBIN A1C: Hgb A1c MFr Bld: 12.4 % — ABNORMAL HIGH (ref 4.0–6.0)

## 2015-08-06 LAB — GLUCOSE, CAPILLARY
GLUCOSE-CAPILLARY: 132 mg/dL — AB (ref 65–99)
GLUCOSE-CAPILLARY: 300 mg/dL — AB (ref 65–99)
Glucose-Capillary: 230 mg/dL — ABNORMAL HIGH (ref 65–99)
Glucose-Capillary: 231 mg/dL — ABNORMAL HIGH (ref 65–99)
Glucose-Capillary: 74 mg/dL (ref 65–99)
Glucose-Capillary: 88 mg/dL (ref 65–99)

## 2015-08-06 MED ORDER — ONDANSETRON HCL 4 MG/2ML IJ SOLN: 4.0000 mg | Freq: Four times a day (QID) | INTRAMUSCULAR | Status: DC | PRN

## 2015-08-06 MED ORDER — ONDANSETRON HCL 4 MG PO TABS: 4.0000 mg | ORAL_TABLET | Freq: Four times a day (QID) | ORAL | Status: DC | PRN

## 2015-08-06 MED ORDER — SODIUM CHLORIDE 0.9% FLUSH
3.0000 mL | Freq: Two times a day (BID) | INTRAVENOUS | Status: DC
  Administered 2015-08-06 – 2015-08-08 (×6): 3 mL via INTRAVENOUS

## 2015-08-06 MED ORDER — ACETAMINOPHEN 650 MG RE SUPP: 650.0000 mg | Freq: Four times a day (QID) | RECTAL | Status: DC | PRN

## 2015-08-06 MED ORDER — GUAIFENESIN-DM 100-10 MG/5ML PO SYRP
5.0000 mL | ORAL_SOLUTION | ORAL | Status: DC | PRN
  Administered 2015-08-06 – 2015-08-08 (×3): 5 mL via ORAL
  Filled 2015-08-06 (×3): qty 5

## 2015-08-06 MED ORDER — INSULIN ASPART 100 UNIT/ML ~~LOC~~ SOLN
0.0000 [IU] | Freq: Four times a day (QID) | SUBCUTANEOUS | Status: DC
  Administered 2015-08-06: 3 [IU] via SUBCUTANEOUS
  Administered 2015-08-06: 5 [IU] via SUBCUTANEOUS
  Administered 2015-08-06: 02:00:00 1 [IU] via SUBCUTANEOUS
  Administered 2015-08-07: 5 [IU] via SUBCUTANEOUS
  Administered 2015-08-07: 3 [IU] via SUBCUTANEOUS
  Filled 2015-08-06: qty 1
  Filled 2015-08-06 (×2): qty 5
  Filled 2015-08-06 (×2): qty 3

## 2015-08-06 MED ORDER — ENOXAPARIN SODIUM 40 MG/0.4ML ~~LOC~~ SOLN
40.0000 mg | SUBCUTANEOUS | Status: DC
  Administered 2015-08-06 – 2015-08-08 (×3): 40 mg via SUBCUTANEOUS
  Filled 2015-08-06 (×3): qty 0.4

## 2015-08-06 MED ORDER — ROSUVASTATIN CALCIUM 5 MG PO TABS
10.0000 mg | ORAL_TABLET | Freq: Every day | ORAL | Status: DC
  Administered 2015-08-06 – 2015-08-08 (×3): 10 mg via ORAL
  Filled 2015-08-06 (×3): qty 2

## 2015-08-06 MED ORDER — ACETAMINOPHEN 325 MG PO TABS: 650.0000 mg | ORAL_TABLET | Freq: Four times a day (QID) | ORAL | Status: DC | PRN

## 2015-08-06 MED ORDER — PANTOPRAZOLE SODIUM 40 MG PO TBEC
40.0000 mg | DELAYED_RELEASE_TABLET | Freq: Two times a day (BID) | ORAL | Status: DC
  Administered 2015-08-06 – 2015-08-08 (×6): 40 mg via ORAL
  Filled 2015-08-06 (×6): qty 1

## 2015-08-06 MED ORDER — MUPIROCIN 2 % EX OINT
1.0000 "application " | TOPICAL_OINTMENT | Freq: Two times a day (BID) | CUTANEOUS | Status: DC
  Administered 2015-08-06 – 2015-08-08 (×5): 1 via NASAL
  Filled 2015-08-06: qty 22

## 2015-08-06 MED ORDER — CHLORHEXIDINE GLUCONATE CLOTH 2 % EX PADS
6.0000 | MEDICATED_PAD | Freq: Every day | CUTANEOUS | Status: DC
  Administered 2015-08-06 – 2015-08-08 (×3): 6 via TOPICAL

## 2015-08-06 MED ORDER — ASPIRIN EC 81 MG PO TBEC
81.0000 mg | DELAYED_RELEASE_TABLET | Freq: Every day | ORAL | Status: DC
  Administered 2015-08-06 – 2015-08-08 (×3): 81 mg via ORAL
  Filled 2015-08-06 (×3): qty 1

## 2015-08-06 MED ORDER — METOPROLOL TARTRATE 25 MG PO TABS
12.5000 mg | ORAL_TABLET | Freq: Two times a day (BID) | ORAL | Status: DC
  Administered 2015-08-06 – 2015-08-08 (×6): 12.5 mg via ORAL
  Filled 2015-08-06 (×6): qty 1

## 2015-08-06 MED ORDER — TAMSULOSIN HCL 0.4 MG PO CAPS
0.4000 mg | ORAL_CAPSULE | Freq: Every day | ORAL | Status: DC
  Administered 2015-08-06 – 2015-08-08 (×3): 0.4 mg via ORAL
  Filled 2015-08-06 (×3): qty 1

## 2015-08-06 NOTE — Progress Notes (Addendum)
[facility] - [place] at [facility]   PATIENT NAME: Jay [name]    MR#:  [id number]  DATE OF BIRTH:  [birth year]  SUBJECTIVE:  Came in after he had epsidoe of gazing around per family. doingwell this am Slept good  REVIEW OF SYSTEMS:   Review of Systems  Constitutional: Negative for fever, chills and weight loss.  HENT: Negative for ear discharge, ear pain and nosebleeds.   Eyes: Negative for blurred vision, pain and discharge.  Respiratory: Negative for sputum production, shortness of breath, wheezing and stridor.   Cardiovascular: Negative for chest pain, palpitations, orthopnea and PND.  Gastrointestinal: Negative for nausea, vomiting, abdominal pain and diarrhea.  Genitourinary: Negative for urgency and frequency.  Musculoskeletal: Negative for back pain and joint pain.  Neurological: Negative for sensory change, speech change, focal weakness and weakness.  Psychiatric/Behavioral: Negative for depression and hallucinations. The patient is not nervous/anxious.   All other systems reviewed and are negative.  Tolerating Diet:yes Tolerating PT: pending  DRUG ALLERGIES:   Allergies  Allergen Reactions  . Metformin Nausea Only    VITALS:  Blood pressure 137/77, pulse 73, temperature 98.7 F (37.1 C), temperature source Oral, resp. rate 18, height  (1.753 m), weight 108.863 kg (240 lb), SpO2 95 %.  PHYSICAL EXAMINATION:   Physical Exam  GENERAL:  70 y.o.-year-old patient lying in the bed with no acute distress.  EYES: Pupils equal, round, reactive to light and accommodation. No scleral icterus. Extraocular muscles intact.  HEENT: Head atraumatic, normocephalic. Oropharynx and nasopharynx clear.  NECK:  Supple, no jugular venous distention. No thyroid enlargement, no tenderness.  LUNGS: Normal breath sounds bilaterally, no wheezing, rales, rhonchi. No use of accessory muscles of respiration.  CARDIOVASCULAR: S1, S2 normal. No murmurs,  rubs, or gallops.  ABDOMEN: Soft, nontender, nondistended. Bowel sounds present. No organomegaly or mass.  EXTREMITIES: No cyanosis, clubbing or edema b/l.    NEUROLOGIC: Cranial nerves II through XII are intact. No focal Motor or sensory deficits b/l.   PSYCHIATRIC:  patient is alert and oriented x 3.  SKIN: No obvious rash, lesion, or ulcer.   LABORATORY PANEL:  CBC  Recent Labs Lab 08/06/15 0425  WBC 12.3*  HGB 11.4*  HCT 33.0*  PLT 207    Chemistries   Recent Labs Lab 08/05/15 2155 08/06/15 0425  NA 131* 132*  K 4.0 3.9  CL 94* 97*  CO2 27 28  GLUCOSE 158* 85  BUN 20 18  CREATININE 1.34* 0.86  CALCIUM 9.1 8.4*  AST 45*  --   ALT 58  --   ALKPHOS 169*  --   BILITOT 0.6  --    Cardiac Enzymes  Recent Labs Lab 08/05/15 2155  TROPONINI <0.03   RADIOLOGY:  Dg Chest 2 View  08/05/2015  CLINICAL DATA:  Acute onset of confusion. Code stroke. Cough. Initial encounter. EXAM: CHEST  2 VIEW COMPARISON:  Chest radiograph performed 07/24/2015 FINDINGS: The lungs are well-aerated. Mild peribronchial thickening is noted. There is no evidence of focal opacification, pleural effusion or pneumothorax. The heart is normal in size; the mediastinal contour is within normal limits. No acute osseous abnormalities are seen. IMPRESSION: Mild peribronchial thickening noted.  Lungs otherwise grossly clear. Electronically Signed   By: Roanna Raider M.D.   On: 08/05/2015 23:24   Ct Head Wo Contrast  08/05/2015  CLINICAL DATA:  Altered mental status beginning 30 minutes ago. Stroke with intubation 2 weeks ago. EXAM: CT HEAD WITHOUT CONTRAST TECHNIQUE: Contiguous  axial images were obtained from the base of the skull through the vertex without intravenous contrast. COMPARISON:  MRI 07/23/2015.  CT 07/22/2015. FINDINGS: No acute finding by CT. Mild small-vessel changes affect the pons and the thalami. No sign of cortical or large vessel territory infarction. No abnormal finding relating to the  left hippocampus. No mass lesion, hemorrhage, hydrocephalus or extra-axial collection. The calvarium is unremarkable. Sinuses, middle ears and mastoids are clear. There is atherosclerotic calcification of the major vessels at the base of the brain. IMPRESSION: No acute finding by CT. Left hippocampus appears unremarkable on this exam. Mild chronic small-vessel changes affect the pons and thalami . There is atherosclerotic calcification of the major vessels at the base of the brain. These results were called by telephone at the time of interpretation on 08/05/2015 at 9:51 pm to Dr. Gladstone Pih , who verbally acknowledged these results. Electronically Signed   By: Paulina Fusi M.D.   On: 08/05/2015 21:51   ASSESSMENT AND PLAN:  Jay [name] is a 70 y.o. male who presents with Possible seizure activity. Patient was recently admitted to the hospital with suspected stroke for an episode similar to when he had tonight. However, workup at that time did not show any stroke. Tonight he was with his family waiting to watch some fireworks when he had an episode of possible seizure activity. His son describes the patient's symptoms as a "glazed over look."  1. Possible Seizures (HCC) - patient had a similar episode a couple weeks ago and was worked up for stroke, but this workup was negative. After tonight's episode telemetry neurologist recommended he be admitted for seizure workup. EEG ordered, neurology consult ordered -pt had EEG done few weeks ago showed slowing due to metabolic encephalopathy -no new abvoe episode -start oral diet  2. CAD in native artery - continue home meds  3 Benign essential HTN - currently at goal, continue home meds   4Type 2 diabetes mellitus with hyperglycemia (HCC) - sliding scale insulin with corresponding glucose checks   5.Depression, major, recurrent (HCC) - continue home meds   6.HLD (hyperlipidemia) - continue home meds  Case discussed with Care  Management/Social Worker. Management plans discussed with the patient, family and they are in agreement.  CODE STATUS: full  DVT Prophylaxis: lovneonx TOTAL TIME TAKING CARE OF THIS PATIENT: 30 minutes.  >50% time spent on counselling and coordination of care  POSSIBLE D/C IN 1-2 DAYS, DEPENDING ON CLINICAL CONDITION.  Note: This dictation was prepared with Dragon dictation along with smaller phrase technology. Any transcriptional errors that result from this process are unintentional.  Redford Behrle M.D on 08/06/2015 at 10:30 AM  Between 7am to 6pm - Pager - [phone]  After 6pm go to [email or web address] - password [email or web address] [facility]  [facility]  Office  [phone]  CC: Primary care physician; Megan Mans, MD

## 2015-08-06 NOTE — H&P (Signed)
[facility] - [place] at [facility]   PATIENT NAME: Jay [name]    MR#:  [id number]  DATE OF BIRTH:  August 08, 1945  DATE OF ADMISSION:  08/05/2015  PRIMARY CARE PHYSICIAN: Megan Mans, MD   REQUESTING/REFERRING PHYSICIAN: Pershing Proud, MD  CHIEF COMPLAINT:   Chief Complaint  Patient presents with  . Code Stroke    HISTORY OF PRESENT ILLNESS:  Jay [name]  is a 70 y.o. male who presents with Possible seizure activity. Patient was recently admitted to the hospital with suspected stroke for an episode similar to when he had tonight. However, workup at that time did not show any stroke. Tonight he was with his family waiting to watch some fireworks when he had an episode of possible seizure activity. His son describes the patient's symptoms as a "glazed over look." He states is very similar to the episode he had 2 weeks ago where he was at home and at that time also began to stare with a "glazed look"and was unresponsive, nonverbal. These episodes last for several minutes, after which the patient seems to have a period of confusion. Patient himself states he does not recall the episodes. Telemetry neurologist saw him in the ED here and felt that he should be admitted for EEG and workup for seizure. They recommended not starting any medications at this time. Hospitalists were called for admission  PAST MEDICAL HISTORY:   Past Medical History  Diagnosis Date  . Diabetes mellitus without complication (HCC)   . Hypertension   . Anemia   . Angina pectoris (HCC)   . CAD (coronary artery disease)   . Foot deformities, congenital   . Fatty liver   . GERD (gastroesophageal reflux disease)   . Bilateral diabetic retinopathy (HCC)   . Morbid obesity (HCC)   . Pedal edema   . Sinoatrial node dysfunction (HCC)   . Sleep apnea     not tolerating CPAP    PAST SURGICAL HISTORY:   Past Surgical History  Procedure Laterality Date  . Coronary angioplasty with  stent placement  2006 and 2008  . Cardiac catheterization Left 07/10/2015    Procedure: Coronary Angiography;  Surgeon: Lamar Blinks, MD;  Location: [facility] INVASIVE CV LAB;  Service: Cardiovascular;  Laterality: Left;    SOCIAL HISTORY:   Social History  Substance Use Topics  . Smoking status: Never Smoker   . Smokeless tobacco: Never Used  . Alcohol Use: No    FAMILY HISTORY:   Family History  Problem Relation Age of Onset  . Leukemia Mother   . Cancer Father   . Deep vein thrombosis Father   . Psychiatric Illness Brother   . Heart disease Brother     DRUG ALLERGIES:   Allergies  Allergen Reactions  . Metformin Nausea Only    MEDICATIONS AT HOME:   Prior to Admission medications   Medication Sig Start Date End Date Taking? Authorizing Provider  aspirin 81 MG tablet Take 1 tablet by mouth daily. 12/01/10   Historical Provider, MD  ferrous sulfate 325 (65 FE) MG tablet Take 1 tablet (325 mg total) by mouth 2 (two) times daily. 05/24/15   Duward Hulen Shouts., MD  glimepiride (AMARYL) 4 MG tablet TAKE 2 TABLETS BY MOUTH TWICE DAILY. 02/14/15   Sebastain Hulen Shouts., MD  insulin aspart (NOVOLOG) 100 UNIT/ML injection Inject 8 Units into the skin 3 (three) times daily with meals. 07/27/15   Catarina Hartshorn, MD  insulin detemir (LEVEMIR) 100  UNIT/ML injection Inject 0.5 mLs (50 Units total) into the skin 2 (two) times daily. 07/27/15   Catarina Hartshorn, MD  lactulose (CHRONULAC) 10 GM/15ML solution Take 45 mLs (30 g total) by mouth 3 (three) times daily. 07/27/15   Catarina Hartshorn, MD  loratadine (CLARITIN) 10 MG tablet Take 1 tablet (10 mg total) by mouth daily. 05/30/15   Cortlin Hulen Shouts., MD  meclizine (ANTIVERT) 25 MG tablet TAKE 1 TABLET BY MOUTH 3 TIMES DAILY AS NEEDED. Patient taking differently: TAKE 1 TABLET BY MOUTH 3 TIMES DAILY AS NEEDED FOR DIZZINESS 11/02/14   Maple Hudson., MD  metoprolol tartrate (LOPRESSOR) 25 MG tablet Take 0.5 tablets (12.5 mg total) by mouth 2 (two)  times daily. 07/27/15   Catarina Hartshorn, MD  mometasone (ELOCON) 0.1 % cream Apply 1 application topically daily. For 2 weeks 03/26/14   Historical Provider, MD  pantoprazole (PROTONIX) 40 MG tablet TAKE 1 TABLET BY MOUTH TWICE DAILY. 01/12/15   Glenn Hulen Shouts., MD  promethazine (PHENERGAN) 25 MG tablet Take 1 tablet (25 mg total) by mouth every 4 (four) hours as needed for nausea or vomiting. 07/16/15   Maple Hudson., MD  rosuvastatin (CRESTOR) 10 MG tablet Take 1 tablet (10 mg total) by mouth daily. 02/14/15   Doron Hulen Shouts., MD  tamsulosin (FLOMAX) 0.4 MG CAPS capsule Take 1 capsule (0.4 mg total) by mouth daily. 05/23/15   Travor Hulen Shouts., MD  TRADJENTA 5 MG TABS tablet TAKE 1 TABLET BY MOUTH ONCE DAILY. 03/18/15   Emmauel Hulen Shouts., MD    REVIEW OF SYSTEMS:  Review of Systems  Constitutional: Negative for fever, chills, weight loss and malaise/fatigue.  HENT: Negative for ear pain, hearing loss and tinnitus.   Eyes: Negative for blurred vision, double vision, pain and redness.  Respiratory: Negative for cough, hemoptysis and shortness of breath.   Cardiovascular: Negative for chest pain, palpitations, orthopnea and leg swelling.  Gastrointestinal: Negative for nausea, vomiting, abdominal pain, diarrhea and constipation.  Genitourinary: Negative for dysuria, frequency and hematuria.  Musculoskeletal: Negative for back pain, joint pain and neck pain.  Skin:       No acne, rash, or lesions  Neurological: Positive for seizures. Negative for dizziness, tremors, focal weakness and weakness.  Endo/Heme/Allergies: Negative for polydipsia. Does not bruise/bleed easily.  Psychiatric/Behavioral: Negative for depression. The patient is not nervous/anxious and does not have insomnia.      VITAL SIGNS:   Filed Vitals:   08/05/15 2200 08/05/15 2230 08/05/15 2300 08/05/15 2330  BP: 155/70 126/108 134/64 131/78  Pulse: 97  93 81  Temp:      TempSrc:      Resp: Height:      Weight:      SpO2: 95%  97% 98%   Wt Readings from Last 3 Encounters:  08/05/15 108.863 kg (240 lb)  07/22/15 119.7 kg (263 lb 14.3 oz)  07/23/15 119.296 kg (263 lb)    PHYSICAL EXAMINATION:  Physical Exam  Vitals reviewed. Constitutional: He is oriented to person, place, and time. He appears well-developed and well-nourished. No distress.  HENT:  Head: Normocephalic and atraumatic.  Mouth/Throat: Oropharynx is clear and moist.  Eyes: Conjunctivae and EOM are normal. Pupils are equal, round, and reactive to light. No scleral icterus.  Neck: Normal range of motion. Neck supple. No JVD present. No thyromegaly present.  Cardiovascular: Normal rate, regular rhythm and intact distal pulses.  Exam reveals no gallop and no friction rub.   Murmur heard. Respiratory: Effort normal and breath sounds normal. No respiratory distress. He has no wheezes. He has no rales.  GI: Soft. Bowel sounds are normal. He exhibits no distension. There is no tenderness.  Musculoskeletal: Normal range of motion. He exhibits no edema.  No arthritis, no gout  Lymphadenopathy:    He has no cervical adenopathy.  Neurological: He is alert and oriented to person, place, and time. No cranial nerve deficit.  No dysarthria, no aphasia  Skin: Skin is warm and dry. No rash noted. No erythema.  Psychiatric: He has a normal mood and affect. His behavior is normal. Judgment and thought content normal.    LABORATORY PANEL:   CBC  Recent Labs Lab 08/05/15 2155  WBC 14.8*  HGB 13.0  HCT 37.5*  PLT 229   ------------------------------------------------------------------------------------------------------------------  Chemistries   Recent Labs Lab 08/05/15 2155  NA 131*  K 4.0  CL 94*  CO2 27  GLUCOSE 158*  BUN 20  CREATININE 1.34*  CALCIUM 9.1  AST 45*  ALT 58  ALKPHOS 169*  BILITOT 0.6    ------------------------------------------------------------------------------------------------------------------  Cardiac Enzymes  Recent Labs Lab 08/05/15 2155  TROPONINI <0.03   ------------------------------------------------------------------------------------------------------------------  RADIOLOGY:  Dg Chest 2 View  08/05/2015  CLINICAL DATA:  Acute onset of confusion. Code stroke. Cough. Initial encounter. EXAM: CHEST  2 VIEW COMPARISON:  Chest radiograph performed 07/24/2015 FINDINGS: The lungs are well-aerated. Mild peribronchial thickening is noted. There is no evidence of focal opacification, pleural effusion or pneumothorax. The heart is normal in size; the mediastinal contour is within normal limits. No acute osseous abnormalities are seen. IMPRESSION: Mild peribronchial thickening noted.  Lungs otherwise grossly clear. Electronically Signed   By: Roanna Raider M.D.   On: 08/05/2015 23:24   Ct Head Wo Contrast  08/05/2015  CLINICAL DATA:  Altered mental status beginning 30 minutes ago. Stroke with intubation 2 weeks ago. EXAM: CT HEAD WITHOUT CONTRAST TECHNIQUE: Contiguous axial images were obtained from the base of the skull through the vertex without intravenous contrast. COMPARISON:  MRI 07/23/2015.  CT 07/22/2015. FINDINGS: No acute finding by CT. Mild small-vessel changes affect the pons and the thalami. No sign of cortical or large vessel territory infarction. No abnormal finding relating to the left hippocampus. No mass lesion, hemorrhage, hydrocephalus or extra-axial collection. The calvarium is unremarkable. Sinuses, middle ears and mastoids are clear. There is atherosclerotic calcification of the major vessels at the base of the brain. IMPRESSION: No acute finding by CT. Left hippocampus appears unremarkable on this exam. Mild chronic small-vessel changes affect the pons and thalami . There is atherosclerotic calcification of the major vessels at the base of the brain.  These results were called by telephone at the time of interpretation on 08/05/2015 at 9:51 pm to Dr. Gladstone Pih , who verbally acknowledged these results. Electronically Signed   By: Paulina Fusi M.D.   On: 08/05/2015 21:51    EKG:   Orders placed or performed during the hospital encounter of 08/05/15  . ED EKG  . ED EKG    IMPRESSION AND PLAN:  Principal Problem:   Seizures (HCC) - patient had a similar episode a couple weeks ago and was worked up for stroke, but this workup was negative. After tonight's episode telemetry neurologist recommended he be admitted for seizure workup. EEG ordered, neurology consult ordered Active Problems:   CAD in native artery - continue home meds   Benign essential  HTN - currently at goal, continue home meds   Type 2 diabetes mellitus with hyperglycemia (HCC) - sliding scale insulin with corresponding glucose checks   Depression, major, recurrent (HCC) - continue home meds   HLD (hyperlipidemia) - continue home meds  All the records are reviewed and case discussed with ED provider. Management plans discussed with the patient and/or family.  DVT PROPHYLAXIS: SubQ lovenox  GI PROPHYLAXIS: PPI  ADMISSION STATUS: Inpatient  CODE STATUS: Full Code Status History    Date Active Date Inactive Code Status Order ID Comments User Context   07/22/2015 12:52 AM 07/27/2015  7:56 PM Full Code [id number]  Coralyn Helling, MD Inpatient   07/10/2015  9:09 AM 07/10/2015  1:13 PM Full Code [id number]  Lamar Blinks, MD Inpatient      TOTAL TIME TAKING CARE OF THIS PATIENT: 45 minutes.    Rosealie Reach FIELDING 08/06/2015, 12:31 AM  [facility]  Office  [phone]  CC: Primary care physician; Megan Mans, MD

## 2015-08-06 NOTE — Progress Notes (Signed)
°   08/06/15 0800  Clinical Encounter Type  Visited With Patient  Visit Type Follow-up  Referral From Chaplain  Consult/Referral To Chaplain  Spiritual Encounters  Spiritual Needs Emotional  Stress Factors  Patient Stress Factors Health changes  Family Stress Factors Family relationships  Advance Directives (For Healthcare)  Does patient have an advance directive? No  Conducted follow-up with patient based upon seeing him admitted to ED. Patient has no memory of events subsequent to going to view fireworks with family. Patient's concerns as of now are hunger. Nurse is following-up to educate patient on release to consume food and beverages. Patient also expressed concern over relationship with daughter-in-law. Chaplain advised that his focus right now should be on regaining strength and to be assured of his son's affection for him, as he stayed with him most of the night in the ED.

## 2015-08-06 NOTE — Consult Note (Signed)
Reason for Consult:possible seizures   CC: Possible seizures   HPI: Jay [name] is an 70 y.o. male with hx of DM2, HTN, HLD, CAD s/p stent who presented to [facility] 6/18 with complaints of altered mental status. At that time he was intubated and ultimately received TPAfor suspected brainstem stroke followed by ? GI bleed. Pt is s/p extubation 6/19 followed by d/c few days after  This time presented with glazed type of a look an confusion.  Which was suspected seizure like activity.    Past Medical History  Diagnosis Date  . Diabetes mellitus without complication (HCC)   . Hypertension   . Anemia   . Angina pectoris (HCC)   . CAD (coronary artery disease)   . Foot deformities, congenital   . Fatty liver   . GERD (gastroesophageal reflux disease)   . Bilateral diabetic retinopathy (HCC)   . Morbid obesity (HCC)   . Pedal edema   . Sinoatrial node dysfunction (HCC)   . Sleep apnea     not tolerating CPAP    Past Surgical History  Procedure Laterality Date  . Coronary angioplasty with stent placement  2006 and 2008  . Cardiac catheterization Left 07/10/2015    Procedure: Coronary Angiography;  Surgeon: Lamar Blinks, MD;  Location: [facility] INVASIVE CV LAB;  Service: Cardiovascular;  Laterality: Left;    Family History  Problem Relation Age of Onset  . Leukemia Mother   . Cancer Father   . Deep vein thrombosis Father   . Psychiatric Illness Brother   . Heart disease Brother     Social History:  reports that he has never smoked. He has never used smokeless tobacco. He reports that he does not drink alcohol or use illicit drugs.  Allergies  Allergen Reactions  . Metformin Nausea Only    Medications: I have reviewed the patient's current medications.  ROS: Pt is disoriented and has difficulty providing full history  Physical Examination: Blood pressure 137/77, pulse 73, temperature 98.7 F (37.1 C), temperature source Oral, resp. rate 18, height 5\' 9"  (1.753 m),  weight 108.863 kg (240 lb), SpO2 95 %.    Neurological Examination Mental Status: Alert, oriented to name and location  Cranial Nerves: II: Discs flat bilaterally; Visual fields grossly normal, pupils equal, round, reactive to light and accommodation III,IV, VI: ptosis not present, extra-ocular motions intact bilaterally V,VII: smile symmetric, facial light touch sensation normal bilaterally VIII: hearing normal bilaterally IX,X: gag reflex present XI: bilateral shoulder shrug XII: midline tongue extension Motor: Right : Upper extremity   4+/5    Left:     Upper extremity   4+/5  Lower extremity   4+/5     Lower extremity   4+/5 Tone and bulk:normal tone throughout; no atrophy noted Sensory: Pinprick and light touch intact throughout, bilaterally Deep Tendon Reflexes: 1+ and symmetric throughout Plantars: Right: downgoing   Left: downgoing Cerebellar: Not tested Gait: not tested.       Laboratory Studies:   Basic Metabolic Panel:  Recent Labs Lab 08/05/15 2155 08/06/15 0425  NA 131* 132*  K 4.0 3.9  CL 94* 97*  CO2 27 28  GLUCOSE 158* 85  BUN 20 18  CREATININE 1.34* 0.86  CALCIUM 9.1 8.4*    Liver Function Tests:  Recent Labs Lab 08/05/15 2155  AST 45*  ALT 58  ALKPHOS 169*  BILITOT 0.6  PROT 7.7  ALBUMIN 3.8   No results for input(s): LIPASE, AMYLASE in the last 168 hours.  Recent Labs Lab 08/05/15 2226  AMMONIA 23    CBC:  Recent Labs Lab 08/05/15 2155 08/06/15 0425  WBC 14.8* 12.3*  NEUTROABS 11.5*  --   HGB 13.0 11.4*  HCT 37.5* 33.0*  MCV 85.2 86.0  PLT 229 207    Cardiac Enzymes:  Recent Labs Lab 08/05/15 2155  TROPONINI <0.03    BNP: Invalid input(s): POCBNP  CBG:  Recent Labs Lab 08/05/15 2220 08/06/15 0208 08/06/15 0617 08/06/15 0835  GLUCAP 175* 132* 74 88    Microbiology: Results for orders placed or performed during the hospital encounter of 07/22/15  MRSA PCR Screening     Status: Abnormal    Collection Time: 07/22/15 12:24 AM  Result Value Ref Range Status   MRSA by PCR POSITIVE (A) NEGATIVE Final    Comment:        The GeneXpert MRSA Assay (FDA approved for NASAL specimens only), is one component of a comprehensive MRSA colonization surveillance program. It is not intended to diagnose MRSA infection nor to guide or monitor treatment for MRSA infections. RESULT CALLED TO, READ BACK BY AND VERIFIED WITH: CROFT,S RN [id number] AT 0238 SKEEN,P   Urine culture     Status: None   Collection Time: 07/25/15 10:51 AM  Result Value Ref Range Status   Specimen Description URINE, CATHETERIZED  Final   Special Requests NONE  Final   Culture NO GROWTH  Final   Report Status 07/26/2015 FINAL  Final    Coagulation Studies: No results for input(s): LABPROT, INR in the last 72 hours.  Urinalysis:  Recent Labs Lab 08/06/15 0150  COLORURINE YELLOW*  LABSPEC 1.013  PHURINE 6.0  GLUCOSEU 50*  HGBUR NEGATIVE  BILIRUBINUR NEGATIVE  KETONESUR NEGATIVE  PROTEINUR NEGATIVE  NITRITE NEGATIVE  LEUKOCYTESUR TRACE*    Lipid Panel:     Component Value Date/Time   CHOL 136 07/22/2015 0112   TRIG 123 07/22/2015 0112   TRIG 121 07/22/2015 0112   HDL 53 07/22/2015 0112   CHOLHDL 2.6 07/22/2015 0112   VLDL 25 07/22/2015 0112   LDLCALC 58 07/22/2015 0112    HgbA1C:  Lab Results  Component Value Date   HGBA1C 12.9* 07/22/2015    Urine Drug Screen:     Component Value Date/Time   LABOPIA NONE DETECTED 08/06/2015 0150   COCAINSCRNUR NONE DETECTED 08/06/2015 0150   LABBENZ NONE DETECTED 08/06/2015 0150   AMPHETMU NONE DETECTED 08/06/2015 0150   THCU NONE DETECTED 08/06/2015 0150   LABBARB NONE DETECTED 08/06/2015 0150    Alcohol Level:  Recent Labs Lab 08/05/15 2155  ETH <5    Other results: EKG: normal EKG, normal sinus rhythm, unchanged from previous tracings.  Imaging: Dg Chest 2 View  08/05/2015  CLINICAL DATA:  Acute onset of confusion. Code stroke. Cough.  Initial encounter. EXAM: CHEST  2 VIEW COMPARISON:  Chest radiograph performed 07/24/2015 FINDINGS: The lungs are well-aerated. Mild peribronchial thickening is noted. There is no evidence of focal opacification, pleural effusion or pneumothorax. The heart is normal in size; the mediastinal contour is within normal limits. No acute osseous abnormalities are seen. IMPRESSION: Mild peribronchial thickening noted.  Lungs otherwise grossly clear. Electronically Signed   By: Roanna Raider M.D.   On: 08/05/2015 23:24   Ct Head Wo Contrast  08/05/2015  CLINICAL DATA:  Altered mental status beginning 30 minutes ago. Stroke with intubation 2 weeks ago. EXAM: CT HEAD WITHOUT CONTRAST TECHNIQUE: Contiguous axial images were obtained from the base of the skull through  the vertex without intravenous contrast. COMPARISON:  MRI 07/23/2015.  CT 07/22/2015. FINDINGS: No acute finding by CT. Mild small-vessel changes affect the pons and the thalami. No sign of cortical or large vessel territory infarction. No abnormal finding relating to the left hippocampus. No mass lesion, hemorrhage, hydrocephalus or extra-axial collection. The calvarium is unremarkable. Sinuses, middle ears and mastoids are clear. There is atherosclerotic calcification of the major vessels at the base of the brain. IMPRESSION: No acute finding by CT. Left hippocampus appears unremarkable on this exam. Mild chronic small-vessel changes affect the pons and thalami . There is atherosclerotic calcification of the major vessels at the base of the brain. These results were called by telephone at the time of interpretation on 08/05/2015 at 9:51 pm to Dr. Gladstone Pih , who verbally acknowledged these results. Electronically Signed   By: Paulina Fusi M.D.   On: 08/05/2015 21:51     Assessment/Plan:  70 y.o. male with hx of DM2, HTN, HLD, CAD s/p stent who presented to [facility] 6/18 with complaints of altered mental status. At that time he was intubated and  ultimately received TPAfor suspected brainstem stroke followed by ? GI bleed. Pt is s/p extubation 6/19 followed by d/c few days after  This time presented with glazed type of a look an confusion.  Which was suspected seizure like activity.   MRI 6/20 reviewed with abnormal signal intensity in L temporal lobe/amygdala/hippocampus.    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    Plan: - Would like to re image with MRI w/ and w /out contrast as previous was done w/out contrast.  Do not think this this is encephalitis/HSV at this time as no fever(tmax 37.1)and  WBC trending down as those prefer the temporal lobe - EEG tomorrow - will hold off AED for now.  - If mentation does not improve/ PLEDs on EEG would consider LP to look for HSV after above work up complete   Pauletta Browns   08/06/2015, 12:15 PM

## 2015-08-06 NOTE — ED Provider Notes (Signed)
[facility] Emergency Department Provider Note   ____________________________________________  Time seen: Seen upon arrival to the emergency department  I have reviewed the triage vital signs and the nursing notes.   HISTORY  Chief Complaint Code Stroke   HPI Jay [name] is a 70 y.o. male with a recent history of encephalopathy, intubated and admitted to [facility] about 2 weeks ago who is presenting to the emergency department today for transient loss of consciousness. Per his family, he was watching fireworks and started to have tremors in bilateral upper extremities. He then became unresponsive and looked to the right for about a minute. He had no generalized seizure activity. He has been confused since. He has no recollection of the events or he was prior to this event. At this time the patient is denying any weakness or numbness. He says that he has been taking his medicines ever since being discharged from the hospital. He is taking lactulose now. His sugars have been better controlled. He does report a mild cough but no fever or sputum production.  A stroke alert was called from triage.  Past Medical History  Diagnosis Date  . Diabetes mellitus without complication (HCC)   . Hypertension   . Anemia   . Angina pectoris (HCC)   . CAD (coronary artery disease)   . Foot deformities, congenital   . Fatty liver   . GERD (gastroesophageal reflux disease)   . Bilateral diabetic retinopathy (HCC)   . Morbid obesity (HCC)   . Pedal edema   . Sinoatrial node dysfunction (HCC)   . Sleep apnea     not tolerating CPAP    Patient Active Problem List   Diagnosis Date Noted  . Encephalopathy, hepatic (HCC) 07/26/2015  . Hyperammonemia (HCC) 07/25/2015  . Type 2 diabetes mellitus with hyperglycemia (HCC) 07/25/2015  . Seizures (HCC)   . Hyperglycemia   . Uncontrolled type 2 diabetes mellitus with ketoacidotic coma, with long-term current use of  insulin (HCC)   . Stroke (HCC) 07/22/2015  . Respiratory failure (HCC)   . Acute encephalopathy   . Unstable angina (HCC) 06/24/2015  . Diabetes mellitus (HCC) 08/20/2014  . Wound infection (HCC) 08/17/2014  . Acute kidney failure (HCC) 05/23/2014  . Actinic keratosis 05/23/2014  . Absolute anemia 05/23/2014  . CAD in native artery 05/23/2014  . Depression, major, recurrent (HCC) 05/23/2014  . DM (diabetes mellitus), secondary (HCC) 05/23/2014  . Diabetic retinopathy (HCC) 05/23/2014  . Abnormal LFTs 05/23/2014  . Fatty infiltration of liver 05/23/2014  . Acid reflux 05/23/2014  . BP (high blood pressure) 05/23/2014  . HLD (hyperlipidemia) 05/23/2014  . Anemia, iron deficiency 05/23/2014  . Metal bone fixation hardware in place 05/23/2014  . Adiposity 05/23/2014  . Obstructive apnea 05/23/2014  . Plaque psoriasis 05/23/2014  . 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 AF (paroxysmal atrial fibrillation) (HCC) 03/01/2014  . Aortic heart valve narrowing 01/05/2014  . Chronic kidney disease (CKD), stage IV (severe) (HCC) 01/05/2014  . Benign essential HTN 01/05/2014    Past Surgical History  Procedure Laterality Date  . Coronary angioplasty with stent placement  2006 and 2008  . Cardiac catheterization Left 07/10/2015    Procedure: Coronary Angiography;  Surgeon: Lamar Blinks, MD;  Location: [facility] INVASIVE CV LAB;  Service: Cardiovascular;  Laterality: Left;    Current Outpatient Rx  Name  Route  Sig  Dispense  Refill  . aspirin 81 MG tablet   Oral   Take 1  tablet by mouth daily.         . ferrous sulfate 325 (65 FE) MG tablet   Oral   Take 1 tablet (325 mg total) by mouth 2 (two) times daily.   60 tablet   2   . glimepiride (AMARYL) 4 MG tablet      TAKE 2 TABLETS BY MOUTH TWICE DAILY.   120 tablet   5   . insulin aspart (NOVOLOG) 100 UNIT/ML injection   Subcutaneous   Inject 8 Units into the skin 3 (three) times daily with meals.   10 mL   0   . insulin  detemir (LEVEMIR) 100 UNIT/ML injection   Subcutaneous   Inject 0.5 mLs (50 Units total) into the skin 2 (two) times daily.   10 mL   0   . lactulose (CHRONULAC) 10 GM/15ML solution   Oral   Take 45 mLs (30 g total) by mouth 3 (three) times daily.   946 mL   0   . loratadine (CLARITIN) 10 MG tablet   Oral   Take 1 tablet (10 mg total) by mouth daily.   30 tablet   11   . meclizine (ANTIVERT) 25 MG tablet      TAKE 1 TABLET BY MOUTH 3 TIMES DAILY AS NEEDED. Patient taking differently: TAKE 1 TABLET BY MOUTH 3 TIMES DAILY AS NEEDED FOR DIZZINESS   90 tablet   5   . metoprolol tartrate (LOPRESSOR) 25 MG tablet   Oral   Take 0.5 tablets (12.5 mg total) by mouth 2 (two) times daily.   60 tablet   0   . mometasone (ELOCON) 0.1 % cream   Topical   Apply 1 application topically daily. For 2 weeks         . pantoprazole (PROTONIX) 40 MG tablet      TAKE 1 TABLET BY MOUTH TWICE DAILY.   60 tablet   5   . promethazine (PHENERGAN) 25 MG tablet   Oral   Take 1 tablet (25 mg total) by mouth every 4 (four) hours as needed for nausea or vomiting.   100 tablet   2   . rosuvastatin (CRESTOR) 10 MG tablet   Oral   Take 1 tablet (10 mg total) by mouth daily.   30 tablet   5   . tamsulosin (FLOMAX) 0.4 MG CAPS capsule   Oral   Take 1 capsule (0.4 mg total) by mouth daily.   30 capsule   3   . TRADJENTA 5 MG TABS tablet      TAKE 1 TABLET BY MOUTH ONCE DAILY.   30 tablet   5     Allergies Metformin  Family History  Problem Relation Age of Onset  . Leukemia Mother   . Cancer Father   . Deep vein thrombosis Father   . Psychiatric Illness Brother   . Heart disease Brother     Social History Social History  Substance Use Topics  . Smoking status: Never Smoker   . Smokeless tobacco: Never Used  . Alcohol Use: No    Review of Systems Constitutional: No fever/chills Eyes: No visual changes. ENT: No sore throat. Cardiovascular: Denies chest  pain. Respiratory: Cough Gastrointestinal: No abdominal pain.  No nausea, no vomiting.  No diarrhea.  No constipation. Genitourinary: Negative for dysuria. Musculoskeletal: Negative for back pain. Skin: Negative for rash. Neurological: Negative for headaches, focal weakness or numbness.  10-point ROS otherwise negative.  ____________________________________________   PHYSICAL EXAM:  VITAL SIGNS: ED Triage Vitals  Enc Vitals Group     BP 08/05/15 2149 150/82 mmHg     Pulse Rate 08/05/15 2147 89     Resp 08/05/15 2147 20     Temp 08/05/15 2149 98.1 F (36.7 C)     Temp Source 08/05/15 2149 Oral     SpO2 08/05/15 2152 100 %     Weight 08/05/15 2147 240 lb (108.863 kg)     Height 08/05/15 2147 5\' 9"  (1.753 m)     Head Cir --      Peak Flow --      Pain Score --      Pain Loc --      Pain Edu? --      Excl. in GC? --     Constitutional: Alert and oriented. Well appearing and in no acute distress. Eyes: Conjunctivae are normal. PERRL. EOMI. Head: Atraumatic. Nose: No congestion/rhinnorhea. Mouth/Throat: Mucous membranes are moist.   Neck: No stridor.   Cardiovascular: Normal rate, regular rhythm. Grossly normal heart sounds.   Respiratory: Normal respiratory effort.  No retractions. Lungs CTAB. Gastrointestinal: Soft and nontender. No distention.  Musculoskeletal: No lower extremity tenderness nor edema.  No joint effusions. Neurologic:  Normal speech and language. No gross focal neurologic deficits are appreciated.  Skin:  Skin is warm, dry and intact. No rash noted. Psychiatric: Mood and affect are normal. Speech and behavior are normal.  ____________________________________________   LABS (all labs ordered are listed, but only abnormal results are displayed)  Labs Reviewed  CBC WITH DIFFERENTIAL/PLATELET - Abnormal; Notable for the following:    WBC 14.8 (*)    HCT 37.5 (*)    RDW 18.9 (*)    Neutro Abs 11.5 (*)    Monocytes Absolute 1.5 (*)    All other  components within normal limits  COMPREHENSIVE METABOLIC PANEL - Abnormal; Notable for the following:    Sodium 131 (*)    Chloride 94 (*)    Glucose, Bld 158 (*)    Creatinine, Ser 1.34 (*)    AST 45 (*)    Alkaline Phosphatase 169 (*)    GFR calc non Af Amer 53 (*)    All other components within normal limits  GLUCOSE, CAPILLARY - Abnormal; Notable for the following:    Glucose-Capillary 175 (*)    All other components within normal limits  TROPONIN I  ETHANOL  AMMONIA  URINALYSIS COMPLETEWITH MICROSCOPIC ([facility] ONLY)  CBC WITH DIFFERENTIAL/PLATELET  URINE DRUG SCREEN, QUALITATIVE ([facility] ONLY)   ____________________________________________  EKG  ED ECG REPORT I, Arelia Longest, the attending physician, personally viewed and interpreted this ECG.   Date: 08/06/2015  EKG Time: 2150  Rate: 75  Rhythm: normal sinus rhythm with PVC times one  Axis: Normal axis  Intervals:none  ST&T Change: No ST segment elevation or depression. No abnormal T-wave inversion.  ____________________________________________  RADIOLOGY DG Chest 2 View (Final result) Result time: 08/05/15 23:24:41   Final result by Rad Results In Interface (08/05/15 23:24:41)   Narrative:   CLINICAL DATA: Acute onset of confusion. Code stroke. Cough. Initial encounter.  EXAM: CHEST 2 VIEW  COMPARISON: Chest radiograph performed 07/24/2015  FINDINGS: The lungs are well-aerated. Mild peribronchial thickening is noted. There is no evidence of focal opacification, pleural effusion or pneumothorax.  The heart is normal in size; the mediastinal contour is within normal limits. No acute osseous abnormalities are seen.  IMPRESSION: Mild peribronchial thickening noted. Lungs otherwise grossly clear.  Electronically Signed By: Roanna Raider M.D. On: 08/05/2015 23:24          CT Head Wo Contrast (Final result) Result time: 08/05/15 21:51:48   Final result by Rad Results In Interface  (08/05/15 21:51:48)   Narrative:   CLINICAL DATA: Altered mental status beginning 30 minutes ago. Stroke with intubation 2 weeks ago.  EXAM: CT HEAD WITHOUT CONTRAST  TECHNIQUE: Contiguous axial images were obtained from the base of the skull through the vertex without intravenous contrast.  COMPARISON: MRI 07/23/2015. CT 07/22/2015.  FINDINGS: No acute finding by CT. Mild small-vessel changes affect the pons and the thalami. No sign of cortical or large vessel territory infarction. No abnormal finding relating to the left hippocampus. No mass lesion, hemorrhage, hydrocephalus or extra-axial collection. The calvarium is unremarkable. Sinuses, middle ears and mastoids are clear. There is atherosclerotic calcification of the major vessels at the base of the brain.  IMPRESSION: No acute finding by CT. Left hippocampus appears unremarkable on this exam. Mild chronic small-vessel changes affect the pons and thalami . There is atherosclerotic calcification of the major vessels at the base of the brain.  These results were called by telephone at the time of interpretation on 08/05/2015 at 9:51 pm to Dr. Gladstone Pih , who verbally acknowledged these results.   Electronically Signed By: Paulina Fusi M.D. On: 08/05/2015 21:51     ____________________________________________   PROCEDURES   Procedures ____________________________________________   INITIAL IMPRESSION / ASSESSMENT AND PLAN / ED COURSE  Pertinent labs & imaging results that were available during my care of the patient were reviewed by me and considered in my medical decision making (see chart for details).  ----------------------------------------- 12:19 AM on 08/06/2015 -----------------------------------------  Patient this time appears to be back to his baseline. The specialist on-call neurologist did recommend admission for further EEG and other recommendations. I discussed with the  neurologist, Dr. Alisa Graff that the patient was back to baseline if the patient could be safely discharged on antiepileptic. Says that the last episode that he had that he was admitted and intubated for was likely "provoked seizure" because the patient had hyperglycemia and also hyperammonemia. However this time it appears to be unprovoked and therefore would be considered basically a first-time seizure without medication. I discussed case with the family there when the patient admitted further workup and likely medication because of the 2 episodes of the patient has had recently they did not want to see an episode worsened. Signed out to Dr. Anne Hahn for admission of the hospital. ____________________________________________   FINAL CLINICAL IMPRESSION(S) / ED DIAGNOSES  Seizure.    NEW MEDICATIONS STARTED DURING THIS VISIT:  New Prescriptions   No medications on file     Note:  This document was prepared using Dragon voice recognition software and may include unintentional dictation errors.    Myrna Blazer, MD 08/06/15 [phone]

## 2015-08-07 ENCOUNTER — Telehealth: Payer: Self-pay | Admitting: Family Medicine

## 2015-08-07 ENCOUNTER — Observation Stay: Payer: PPO

## 2015-08-07 DIAGNOSIS — R569 Unspecified convulsions: Secondary | ICD-10-CM | POA: Diagnosis not present

## 2015-08-07 DIAGNOSIS — I10 Essential (primary) hypertension: Secondary | ICD-10-CM | POA: Diagnosis not present

## 2015-08-07 DIAGNOSIS — I2510 Atherosclerotic heart disease of native coronary artery without angina pectoris: Secondary | ICD-10-CM | POA: Diagnosis not present

## 2015-08-07 DIAGNOSIS — E119 Type 2 diabetes mellitus without complications: Secondary | ICD-10-CM | POA: Diagnosis not present

## 2015-08-07 LAB — GLUCOSE, CAPILLARY
GLUCOSE-CAPILLARY: 259 mg/dL — AB (ref 65–99)
GLUCOSE-CAPILLARY: 223 mg/dL — AB (ref 65–99)
Glucose-Capillary: 246 mg/dL — ABNORMAL HIGH (ref 65–99)
Glucose-Capillary: 269 mg/dL — ABNORMAL HIGH (ref 65–99)
Glucose-Capillary: 236 mg/dL — ABNORMAL HIGH (ref 65–99)
Glucose-Capillary: 261 mg/dL — ABNORMAL HIGH (ref 65–99)

## 2015-08-07 MED ORDER — INSULIN GLARGINE 100 UNIT/ML ~~LOC~~ SOLN: 10.0000 [IU] | Freq: Every day | SUBCUTANEOUS | Status: DC

## 2015-08-07 MED ORDER — AMLODIPINE BESYLATE 10 MG PO TABS
5.0000 mg | ORAL_TABLET | Freq: Every day | ORAL | Status: DC
  Administered 2015-08-07 – 2015-08-08 (×2): 5 mg via ORAL
  Filled 2015-08-07 (×2): qty 1

## 2015-08-07 MED ORDER — INSULIN GLARGINE 100 UNIT/ML ~~LOC~~ SOLN
15.0000 [IU] | Freq: Every day | SUBCUTANEOUS | Status: DC
  Filled 2015-08-07: qty 0.15

## 2015-08-07 MED ORDER — INSULIN ASPART 100 UNIT/ML ~~LOC~~ SOLN
0.0000 [IU] | Freq: Three times a day (TID) | SUBCUTANEOUS | Status: DC
  Administered 2015-08-07 – 2015-08-08 (×3): 8 [IU] via SUBCUTANEOUS
  Administered 2015-08-08: 17:00:00 11 [IU] via SUBCUTANEOUS
  Administered 2015-08-08: 5 [IU] via SUBCUTANEOUS
  Filled 2015-08-07: qty 8
  Filled 2015-08-07: qty 11
  Filled 2015-08-07 (×2): qty 8
  Filled 2015-08-07: qty 5

## 2015-08-07 MED ORDER — NYSTATIN 100000 UNIT/GM EX POWD
Freq: Two times a day (BID) | CUTANEOUS | Status: DC
Start: 2015-08-07 — End: 2015-08-08
  Administered 2015-08-07 – 2015-08-08 (×3): via TOPICAL
  Filled 2015-08-07: qty 15

## 2015-08-07 MED ORDER — GADOBENATE DIMEGLUMINE 529 MG/ML IV SOLN
20.0000 mL | Freq: Once | INTRAVENOUS | Status: AC | PRN
  Administered 2015-08-07: 20 mL via INTRAVENOUS

## 2015-08-07 MED ORDER — INSULIN GLARGINE 100 UNIT/ML ~~LOC~~ SOLN
15.0000 [IU] | Freq: Every day | SUBCUTANEOUS | Status: DC
Start: 2015-08-07 — End: 2015-08-08
  Administered 2015-08-07 – 2015-08-08 (×2): 15 [IU] via SUBCUTANEOUS
  Filled 2015-08-07 (×2): qty 0.15

## 2015-08-07 MED ORDER — LORAZEPAM 2 MG/ML IJ SOLN
2.0000 mg | Freq: Once | INTRAMUSCULAR | Status: AC
  Administered 2015-08-07: 14:00:00 2 mg via INTRAVENOUS
  Filled 2015-08-07: qty 1

## 2015-08-07 MED ORDER — INSULIN ASPART 100 UNIT/ML ~~LOC~~ SOLN
0.0000 [IU] | Freq: Every day | SUBCUTANEOUS | Status: DC
  Administered 2015-08-08: 4 [IU] via SUBCUTANEOUS
  Filled 2015-08-07: qty 4

## 2015-08-07 MED ORDER — LORATADINE 10 MG PO TABS
10.0000 mg | ORAL_TABLET | Freq: Every day | ORAL | Status: DC
  Administered 2015-08-07 – 2015-08-08 (×2): 10 mg via ORAL
  Filled 2015-08-07 (×2): qty 1

## 2015-08-07 NOTE — Telephone Encounter (Signed)
Arline Asp stated that the physical therapist saw pt and thinks pt would benefit with a nurse coming in to visit with pt to assist with medications. Arline Asp would like verbal orders to ok the nurse visits. Please advise. Thanks TNP

## 2015-08-07 NOTE — Care Management (Addendum)
Admitted to [facility] with the diagnosis of seizures. Discharged from [facility] to [facility] in [place] 07/26/15. States he was in [place] about a week. Wife is in [facility] in [place] now.  States he would like her to be closer to home. Encouraged Mr. Vallis to discuss this plan with social worker at [facility]. States he is currently living with his son, Tannar. [phone] States he is currently followed by [facility], but can't remember if they have been to his home yet. States he uses no aids for ambulation. Good appetite. Takes care of all basic activities of daily living himself. Son helps with errands. Scheduled for EEG and MRI today. Gwenette Greet RN MSN CCM Care Management [phone]

## 2015-08-07 NOTE — Telephone Encounter (Signed)
Jay [name] with [facility] is requesting a verbal order for Physical therapy 2 times a week for 1 week, 3 times a week for 1 week and 2 times a week for 4 weeks.  She is also requesting a verbal order for skilled nursing to go into the home to help with medication management.  ID#[phone]/WE

## 2015-08-07 NOTE — Telephone Encounter (Signed)
Authorization for below left via VM.

## 2015-08-07 NOTE — Telephone Encounter (Signed)
Please review. Thanks!  

## 2015-08-07 NOTE — Progress Notes (Signed)
[facility] - [place] at [facility]   PATIENT NAME: Jay [name]    MR#:  [id number]  DATE OF BIRTH:  February 28, 1945  SUBJECTIVE:  CHIEF COMPLAINT:   Chief Complaint  Patient presents with  . Code Stroke   - complains of anxiety with MRI, has a dry cough and post nasal drip today - No further blacking out episodes - pending MRI, EEG and neuro follow up  REVIEW OF SYSTEMS:  Review of Systems  Constitutional: Positive for malaise/fatigue. Negative for fever and chills.  HENT: Positive for congestion. Negative for ear discharge and ear pain.   Respiratory: Positive for cough. Negative for shortness of breath and wheezing.   Cardiovascular: Negative for chest pain, palpitations and leg swelling.  Gastrointestinal: Negative for nausea, vomiting, abdominal pain, diarrhea and constipation.  Genitourinary: Negative for dysuria and urgency.  Musculoskeletal: Negative for myalgias.  Neurological: Negative for dizziness, sensory change, speech change, focal weakness, seizures and headaches.  Psychiatric/Behavioral: Negative for depression.    DRUG ALLERGIES:   Allergies  Allergen Reactions  . Metformin Nausea Only    VITALS:  Blood pressure 151/79, pulse 92, temperature 98.4 F (36.9 C), temperature source Oral, resp. rate 18, height  (1.753 m), weight 108.863 kg (240 lb), SpO2 97 %.  PHYSICAL EXAMINATION:  Physical Exam  GENERAL:  70 y.o.-year-old obese patient lying in the bed with no acute distress.  EYES: Pupils equal, round, reactive to light and accommodation. No scleral icterus. Extraocular muscles intact.  HEENT: Head atraumatic, normocephalic. Oropharynx and nasopharynx clear.  NECK:  Supple, no jugular venous distention. No thyroid enlargement, no tenderness.  LUNGS: Normal breath sounds bilaterally, no wheezing, rales,rhonchi or crepitation. No use of accessory muscles of respiration. Decreased bibasilar breath sounds CARDIOVASCULAR: S1,  S2 normal. No murmurs, rubs, or gallops.  ABDOMEN: Soft, obese, nontender, nondistended. Bowel sounds present. No organomegaly or mass.  EXTREMITIES: No pedal edema, cyanosis, or clubbing.  NEUROLOGIC: Cranial nerves II through XII are intact. Muscle strength 5/5 in all extremities. Sensation intact. Gait not checked.  PSYCHIATRIC: The patient is alert and oriented x 3.  SKIN: No obvious rash, lesion, or ulcer.    LABORATORY PANEL:   CBC  Recent Labs Lab 08/06/15 0425  WBC 12.3*  HGB 11.4*  HCT 33.0*  PLT 207   ------------------------------------------------------------------------------------------------------------------  Chemistries   Recent Labs Lab 08/05/15 2155 08/06/15 0425  NA 131* 132*  K 4.0 3.9  CL 94* 97*  CO2 27 28  GLUCOSE 158* 85  BUN 20 18  CREATININE 1.34* 0.86  CALCIUM 9.1 8.4*  AST 45*  --   ALT 58  --   ALKPHOS 169*  --   BILITOT 0.6  --    ------------------------------------------------------------------------------------------------------------------  Cardiac Enzymes  Recent Labs Lab 08/05/15 2155  TROPONINI <0.03   ------------------------------------------------------------------------------------------------------------------  RADIOLOGY:  Dg Chest 2 View  08/05/2015  CLINICAL DATA:  Acute onset of confusion. Code stroke. Cough. Initial encounter. EXAM: CHEST  2 VIEW COMPARISON:  Chest radiograph performed 07/24/2015 FINDINGS: The lungs are well-aerated. Mild peribronchial thickening is noted. There is no evidence of focal opacification, pleural effusion or pneumothorax. The heart is normal in size; the mediastinal contour is within normal limits. No acute osseous abnormalities are seen. IMPRESSION: Mild peribronchial thickening noted.  Lungs otherwise grossly clear. Electronically Signed   By: Roanna Raider M.D.   On: 08/05/2015 23:24   Ct Head Wo Contrast  08/05/2015  CLINICAL DATA:  Altered mental status beginning 30 minutes  ago.  Stroke with intubation 2 weeks ago. EXAM: CT HEAD WITHOUT CONTRAST TECHNIQUE: Contiguous axial images were obtained from the base of the skull through the vertex without intravenous contrast. COMPARISON:  MRI 07/23/2015.  CT 07/22/2015. FINDINGS: No acute finding by CT. Mild small-vessel changes affect the pons and the thalami. No sign of cortical or large vessel territory infarction. No abnormal finding relating to the left hippocampus. No mass lesion, hemorrhage, hydrocephalus or extra-axial collection. The calvarium is unremarkable. Sinuses, middle ears and mastoids are clear. There is atherosclerotic calcification of the major vessels at the base of the brain. IMPRESSION: No acute finding by CT. Left hippocampus appears unremarkable on this exam. Mild chronic small-vessel changes affect the pons and thalami . There is atherosclerotic calcification of the major vessels at the base of the brain. These results were called by telephone at the time of interpretation on 08/05/2015 at 9:51 pm to Dr. Gladstone Pih , who verbally acknowledged these results. Electronically Signed   By: Paulina Fusi M.D.   On: 08/05/2015 21:51    EKG:   Orders placed or performed during the hospital encounter of 08/05/15  . ED EKG  . ED EKG    ASSESSMENT AND PLAN:   Jay [name] is a 70 y.o. male who presents with Possible seizure activity. Patient was recently admitted to the hospital at [facility] with suspected stroke for an episode similar to this admission- glazed look.  1. Altered mental status- Possible Seizures - simple partial?  - patient had a similar episode a couple weeks ago and was worked up for stroke, was a questionable signal in the left hippocampus area unsure from seizures or any encephalitis. -Remote tiny infarcts were noted as well. - Appreciate neurology consult. Repeat MRI of the brain with and without contrast has been ordered. -EEG today and follow-up neurology consult. Afebrile workup is  negative, might need a lumbar puncture as well. -No further episodes in the hospital. Not started on antiepileptics at this time. -Mental status is at baseline now.  2. CAD in native artery - stable. Recent cardiac catheter done about a month ago showing minimal three-vessel disease and occlusion is less than 30%. That was done for unstable angina pain. -continue home meds-on aspirin, statin and metoprolol  3 Benign essential HTN - currently at goal, continue home meds-on low-dose metoprolol only   4. Type 2 diabetes mellitus with hyperglycemia (HCC) - sliding scale insulin with corresponding glucose checks -Amaryl is on hold as well as Tradjenta is on hold. Also his Levemir and NovoLog are on hold here. -Check A1c   5.BPH-continue Flomax   6.HLD (hyperlipidemia) - on Crestor  7. DVT prophylaxis-on Lovenox   All the records are reviewed and case discussed with Care Management/Social Workerr. Management plans discussed with the patient, family and they are in agreement.  CODE STATUS: Full Code  TOTAL TIME TAKING CARE OF THIS PATIENT:  37 minutes.   POSSIBLE D/C IN 1-2 DAYS, DEPENDING ON CLINICAL CONDITION.   Taleah Bellantoni M.D on 08/07/2015 at 8:52 AM  Between 7am to 6pm - Pager - [phone]  After 6pm go to [email or web address] - password [email or web address] [facility]  [facility]  Office  [phone]  CC: Primary care physician; Megan Mans, MD

## 2015-08-07 NOTE — Telephone Encounter (Signed)
OK==also please set up f/u appt from hospital if he is now home.

## 2015-08-07 NOTE — Progress Notes (Signed)
Inpatient Diabetes Program Recommendations  AACE/ADA: New Consensus Statement on Inpatient Glycemic Control (2015)  Target Ranges:  Prepandial:   less than 140 mg/dL      Peak postprandial:   less than 180 mg/dL (1-2 hours)      Critically ill patients:  140 - 180 mg/dL   Lab Results  Component Value Date   GLUCAP 236* 08/07/2015   HGBA1C 12.4* 08/06/2015    Review of Glycemic Control  Results for BREXTEN, Jay [name] (MRN [id number]) as of 08/07/2015 09:16  Ref. Range 08/06/2015 19:57 08/06/2015 23:34 08/07/2015 04:00 08/07/2015 06:15 08/07/2015 07:54  Glucose-Capillary Latest Ref Range: 65-99 mg/dL 789 (H) 784 (H) 784 (H) 269 (H) 236 (H)    Diabetes history: Type 2 with A1C 12.4%  Outpatient Diabetes medications: Amaryl 4 mg- 2 tablets daily, Novolog 8 units tid, Levemir 50 units bid Current orders for Inpatient glycemic control: Lantus 15 units qhs, Novolog 0-9 units q6h  Inpatient Diabetes Program Recommendations:  Please change diet to heart healthy/carb modified.  Please increase Lantus to 22 units qhs (0.2units/kg). Consider changing current order for Novolog correction to moderate correction scale (0-15 units)tid and hs Novolog correction to 0-5 units qhs Susette Racer, RN, [place], [place], CDE Diabetes Coordinator Inpatient Diabetes Program  [phone] (Team Pager) [phone] [facility] Office) 08/07/2015 9:24 AM

## 2015-08-07 NOTE — Progress Notes (Signed)
Subjective: No further episodes of confusion.  Patient reports some memory problems since stroke admission.  EEG at that time was only significant for slowing.    Objective: Current vital signs: BP 167/66 mmHg  Pulse 86  Temp(Src) 97.8 F (36.6 C) (Oral)  Resp 20  Ht 5\' 9"  (1.753 m)  Wt 108.863 kg (240 lb)  BMI 35.43 kg/m2  SpO2 96% Vital signs in last 24 hours: Temp:  [97.8 F (36.6 C)-98.4 F (36.9 C)] 97.8 F (36.6 C) (07/05 1121) Pulse Rate:  [68-92] 86 (07/05 1121) Resp:  [18-20] 20 (07/05 1121) BP: (128-167)/(62-79) 167/66 mmHg (07/05 1121) SpO2:  [94 %-97 %] 96 % (07/05 1121)  Intake/Output from previous day: 07/04 0701 - 07/05 0700 In: -  Out: 2100 [Urine:2100] Intake/Output this shift:   Nutritional status: Diet Carb Modified Fluid consistency:: Thin; Room service appropriate?: Yes  Neurologic Exam: Mental Status: Alert, oriented to month, date but not year.  Knows he is in the hospital but not which hospital.  Speech fluent without evidence of aphasia.  Able to follow 3 step commands without difficulty. Cranial Nerves: II: Discs flat bilaterally; Visual fields grossly normal, pupils equal, round, reactive to light and accommodation III,IV, VI: ptosis not present, extra-ocular motions intact bilaterally V,VII: smile symmetric, facial light touch sensation normal bilaterally VIII: hearing normal bilaterally IX,X: gag reflex present XI: bilateral shoulder shrug XII: midline tongue extension Motor: Right : Upper extremity   5/5    Left:     Upper extremity   5/5  Lower extremity   5/5     Lower extremity   5/5 Tone and bulk:normal tone throughout; no atrophy noted Sensory: Pinprick and light touch intact throughout, bilaterally Deep Tendon Reflexes: 2+ and symmetric with absent AJ's bilaterally Plantars: Right: downgoing   Left: downgoing Cerebellar: normal finger-to-nose and normal heel-to-shin testing bilaterally   Lab Results: Basic Metabolic  Panel:  Recent Labs Lab 08/05/15 2155 08/06/15 0425  NA 131* 132*  K 4.0 3.9  CL 94* 97*  CO2 27 28  GLUCOSE 158* 85  BUN 20 18  CREATININE 1.34* 0.86  CALCIUM 9.1 8.4*    Liver Function Tests:  Recent Labs Lab 08/05/15 2155  AST 45*  ALT 58  ALKPHOS 169*  BILITOT 0.6  PROT 7.7  ALBUMIN 3.8   No results for input(s): LIPASE, AMYLASE in the last 168 hours.  Recent Labs Lab 08/05/15 2226  AMMONIA 23    CBC:  Recent Labs Lab 08/05/15 2155 08/06/15 0425  WBC 14.8* 12.3*  NEUTROABS 11.5*  --   HGB 13.0 11.4*  HCT 37.5* 33.0*  MCV 85.2 86.0  PLT 229 207    Cardiac Enzymes:  Recent Labs Lab 08/05/15 2155  TROPONINI <0.03    Lipid Panel: No results for input(s): CHOL, TRIG, HDL, CHOLHDL, VLDL, LDLCALC in the last 168 hours.  CBG:  Recent Labs Lab 08/06/15 1957 08/06/15 2334 08/07/15 0400 08/07/15 0615 08/07/15 0754  GLUCAP 300* 230* 246* 269* 236*    Microbiology: Results for orders placed or performed during the hospital encounter of 07/22/15  MRSA PCR Screening     Status: Abnormal   Collection Time: 07/22/15 12:24 AM  Result Value Ref Range Status   MRSA by PCR POSITIVE (A) NEGATIVE Final    Comment:        The GeneXpert MRSA Assay (FDA approved for NASAL specimens only), is one component of a comprehensive MRSA colonization surveillance program. It is not intended to diagnose MRSA infection  nor to guide or monitor treatment for MRSA infections. RESULT CALLED TO, READ BACK BY AND VERIFIED WITH: CROFT,S RN [id number] AT 0238 SKEEN,P   Urine culture     Status: None   Collection Time: 07/25/15 10:51 AM  Result Value Ref Range Status   Specimen Description URINE, CATHETERIZED  Final   Special Requests NONE  Final   Culture NO GROWTH  Final   Report Status 07/26/2015 FINAL  Final    Coagulation Studies: No results for input(s): LABPROT, INR in the last 72 hours.  Imaging: Dg Chest 2 View  08/05/2015  CLINICAL DATA:  Acute  onset of confusion. Code stroke. Cough. Initial encounter. EXAM: CHEST  2 VIEW COMPARISON:  Chest radiograph performed 07/24/2015 FINDINGS: The lungs are well-aerated. Mild peribronchial thickening is noted. There is no evidence of focal opacification, pleural effusion or pneumothorax. The heart is normal in size; the mediastinal contour is within normal limits. No acute osseous abnormalities are seen. IMPRESSION: Mild peribronchial thickening noted.  Lungs otherwise grossly clear. Electronically Signed   By: Roanna Raider M.D.   On: 08/05/2015 23:24   Ct Head Wo Contrast  08/05/2015  CLINICAL DATA:  Altered mental status beginning 30 minutes ago. Stroke with intubation 2 weeks ago. EXAM: CT HEAD WITHOUT CONTRAST TECHNIQUE: Contiguous axial images were obtained from the base of the skull through the vertex without intravenous contrast. COMPARISON:  MRI 07/23/2015.  CT 07/22/2015. FINDINGS: No acute finding by CT. Mild small-vessel changes affect the pons and the thalami. No sign of cortical or large vessel territory infarction. No abnormal finding relating to the left hippocampus. No mass lesion, hemorrhage, hydrocephalus or extra-axial collection. The calvarium is unremarkable. Sinuses, middle ears and mastoids are clear. There is atherosclerotic calcification of the major vessels at the base of the brain. IMPRESSION: No acute finding by CT. Left hippocampus appears unremarkable on this exam. Mild chronic small-vessel changes affect the pons and thalami . There is atherosclerotic calcification of the major vessels at the base of the brain. These results were called by telephone at the time of interpretation on 08/05/2015 at 9:51 pm to Dr. Gladstone Pih , who verbally acknowledged these results. Electronically Signed   By: Paulina Fusi M.D.   On: 08/05/2015 21:51    Medications:  I have reviewed the patient's current medications. Scheduled: . aspirin EC  81 mg Oral Daily  . Chlorhexidine Gluconate Cloth  6  each Topical Q0600  . enoxaparin (LOVENOX) injection  40 mg Subcutaneous Q24H  . insulin aspart  0-15 Units Subcutaneous TID WC  . insulin aspart  0-5 Units Subcutaneous QHS  . insulin glargine  15 Units Subcutaneous Daily  . loratadine  10 mg Oral Daily  . LORazepam  2 mg Intravenous Once  . metoprolol tartrate  12.5 mg Oral BID  . mupirocin ointment  1 application Nasal BID  . nystatin   Topical BID  . pantoprazole  40 mg Oral BID  . rosuvastatin  10 mg Oral Daily  . sodium chloride flush  3 mL Intravenous Q12H  . tamsulosin  0.4 mg Oral Daily    Assessment/Plan: Etiology of episode of confusion remains unclear.  Patient with similar  Episode about a weeks ago.  Work up remarkable for hyperglycemia and left temporal lobe findings.  Although HSV encephalitis was on the differential, patient was not treated and has not continued to decline therefore not likely.  Seizure does remain on the differential.  MRI and EEG pending.  LOS: 1 day   Thana Farr, MD Neurology [phone] 08/07/2015  11:48 AM

## 2015-08-07 NOTE — Telephone Encounter (Signed)
Please advise 

## 2015-08-08 DIAGNOSIS — I10 Essential (primary) hypertension: Secondary | ICD-10-CM | POA: Diagnosis not present

## 2015-08-08 DIAGNOSIS — E119 Type 2 diabetes mellitus without complications: Secondary | ICD-10-CM | POA: Diagnosis not present

## 2015-08-08 DIAGNOSIS — I2510 Atherosclerotic heart disease of native coronary artery without angina pectoris: Secondary | ICD-10-CM | POA: Diagnosis not present

## 2015-08-08 DIAGNOSIS — R569 Unspecified convulsions: Secondary | ICD-10-CM | POA: Diagnosis not present

## 2015-08-08 LAB — BASIC METABOLIC PANEL
Anion gap: 6 (ref 5–15)
BUN: 24 mg/dL — ABNORMAL HIGH (ref 6–20)
CHLORIDE: 96 mmol/L — AB (ref 101–111)
CO2: 31 mmol/L (ref 22–32)
Calcium: 8.6 mg/dL — ABNORMAL LOW (ref 8.9–10.3)
Creatinine, Ser: 1.1 mg/dL (ref 0.61–1.24)
Glucose, Bld: 204 mg/dL — ABNORMAL HIGH (ref 65–99)
POTASSIUM: 4.6 mmol/L (ref 3.5–5.1)
SODIUM: 133 mmol/L — AB (ref 135–145)

## 2015-08-08 LAB — GLUCOSE, CAPILLARY
Glucose-Capillary: 218 mg/dL — ABNORMAL HIGH (ref 65–99)
Glucose-Capillary: 298 mg/dL — ABNORMAL HIGH (ref 65–99)
Glucose-Capillary: 319 mg/dL — ABNORMAL HIGH (ref 65–99)
Glucose-Capillary: 325 mg/dL — ABNORMAL HIGH (ref 65–99)

## 2015-08-08 MED ORDER — INSULIN DETEMIR 100 UNIT/ML ~~LOC~~ SOLN: 35.0000 [IU] | Freq: Two times a day (BID) | SUBCUTANEOUS | Status: DC

## 2015-08-08 MED ORDER — INSULIN ASPART 100 UNIT/ML ~~LOC~~ SOLN
5.0000 [IU] | Freq: Three times a day (TID) | SUBCUTANEOUS | Status: DC
  Administered 2015-08-08 (×2): 5 [IU] via SUBCUTANEOUS
  Filled 2015-08-08 (×2): qty 5

## 2015-08-08 MED ORDER — LACTULOSE 10 GM/15ML PO SOLN: 20.0000 g | Freq: Two times a day (BID) | ORAL | Status: DC

## 2015-08-08 MED ORDER — LACOSAMIDE 50 MG PO TABS
50.0000 mg | ORAL_TABLET | Freq: Two times a day (BID) | ORAL | Status: DC
  Administered 2015-08-08: 13:00:00 50 mg via ORAL
  Filled 2015-08-08: qty 1

## 2015-08-08 MED ORDER — MECLIZINE HCL 25 MG PO TABS: ORAL_TABLET | ORAL | Status: AC

## 2015-08-08 MED ORDER — LACOSAMIDE 50 MG PO TABS: 50.0000 mg | ORAL_TABLET | Freq: Two times a day (BID) | ORAL | Status: AC

## 2015-08-08 MED ORDER — AMLODIPINE BESYLATE 5 MG PO TABS: 5.0000 mg | ORAL_TABLET | Freq: Every day | ORAL | Status: DC

## 2015-08-08 MED ORDER — INSULIN GLARGINE 100 UNIT/ML ~~LOC~~ SOLN
7.0000 [IU] | Freq: Once | SUBCUTANEOUS | Status: AC
  Administered 2015-08-08: 7 [IU] via SUBCUTANEOUS
  Filled 2015-08-08: qty 0.07

## 2015-08-08 MED ORDER — INSULIN GLARGINE 100 UNIT/ML ~~LOC~~ SOLN
22.0000 [IU] | Freq: Every day | SUBCUTANEOUS | Status: DC
  Filled 2015-08-08: qty 0.22

## 2015-08-08 NOTE — Progress Notes (Addendum)
Inpatient Diabetes Program Recommendations  AACE/ADA: New Consensus Statement on Inpatient Glycemic Control (2015)  Target Ranges:  Prepandial:   less than 140 mg/dL      Peak postprandial:   less than 180 mg/dL (1-2 hours)      Critically ill patients:  140 - 180 mg/dL   Lab Results  Component Value Date   GLUCAP 218* 08/08/2015   HGBA1C 12.4* 08/06/2015    Review of Glycemic Control   Results for CRU, BORDAS (MRN [id number]) as of 08/08/2015 08:47  Ref. Range 08/07/2015 13:02 08/07/2015 16:28 08/07/2015 20:45 08/08/2015 00:06 08/08/2015 07:35  Glucose-Capillary Latest Ref Range: 65-99 mg/dL 336 (H) 122 (H) 449 (H) 325 (H) 218 (H)    Diabetes history: Type 2 with A1C 12.4%  Outpatient Diabetes medications: Amaryl 4 mg- 2 tablets daily, Novolog 8 units tid, Levemir 50 units bid Current orders for Inpatient glycemic control: Lantus 15 units qhs, Novolog 0-15 units tid, Novolog 0-5 units qhs  Inpatient Diabetes Program Recommendations:   Fasting blood sugar remains high- please increase Lantus to 22 units qhs (0.2units/kg).   If he's eating his meals, consider starting Novolog 8 units tid with meals- continue Novolog correction as ordered.   If he's not eating well, consider increasing Novolog to resistant scale tid.  Susette Racer, RN, BA, MHA, CDE Diabetes Coordinator Inpatient Diabetes Program  [phone] (Team Pager) [phone] [facility] Office) 08/08/2015 8:51 AM

## 2015-08-08 NOTE — Progress Notes (Signed)
Subjective: Patient continues to have memory problems but has not been noted to have any further starring spells.    Objective: Current vital signs: BP 122/67 mmHg  Pulse 87  Temp(Src) 97.5 F (36.4 C) (Oral)  Resp 18  Ht 5\' 9"  (1.753 m)  Wt 108.863 kg (240 lb)  BMI 35.43 kg/m2  SpO2 95% Vital signs in last 24 hours: Temp:  [97.5 F (36.4 C)-98.4 F (36.9 C)] 97.5 F (36.4 C) (07/06 1027) Pulse Rate:  [76-108] 87 (07/06 1027) Resp:  [16-20] 18 (07/06 1027) BP: (103-153)/(53-68) 122/67 mmHg (07/06 1027) SpO2:  [93 %-97 %] 95 % (07/06 1027)  Intake/Output from previous day: 07/05 0701 - 07/06 0700 In: -  Out: 400 [Urine:400] Intake/Output this shift:   Nutritional status: Diet Carb Modified Fluid consistency:: Thin; Room service appropriate?: Yes  Neurologic Exam: Mental Status: Alert, oriented to month, date but not year. Knows he is in the hospital but not which hospital. Speech fluent without evidence of aphasia. Able to follow 3 step commands without difficulty. Cranial Nerves: II: Discs flat bilaterally; Visual fields grossly normal, pupils equal, round, reactive to light and accommodation III,IV, VI: ptosis not present, extra-ocular motions intact bilaterally V,VII: smile symmetric, facial light touch sensation normal bilaterally VIII: hearing normal bilaterally IX,X: gag reflex present XI: bilateral shoulder shrug XII: midline tongue extension Motor: Right :Upper extremity 5/5Left: Upper extremity 5/5 Lower extremity 5/5Lower extremity 5/5 Tone and bulk:normal tone throughout; no atrophy noted Sensory: Pinprick and light touch intact throughout, bilaterally Deep Tendon Reflexes: 2+ and symmetric with absent AJ's bilaterally Plantars: Right: downgoingLeft: downgoing Cerebellar: normal finger-to-nose and normal  heel-to-shin testing bilaterally  Lab Results: Basic Metabolic Panel:  Recent Labs Lab 08/05/15 2155 08/06/15 0425 08/08/15 0637  NA 131* 132* 133*  K 4.0 3.9 4.6  CL 94* 97* 96*  CO2 27 28 31   GLUCOSE 158* 85 204*  BUN 20 18 24*  CREATININE 1.34* 0.86 1.10  CALCIUM 9.1 8.4* 8.6*    Liver Function Tests:  Recent Labs Lab 08/05/15 2155  AST 45*  ALT 58  ALKPHOS 169*  BILITOT 0.6  PROT 7.7  ALBUMIN 3.8   No results for input(s): LIPASE, AMYLASE in the last 168 hours.  Recent Labs Lab 08/05/15 2226  AMMONIA 23    CBC:  Recent Labs Lab 08/05/15 2155 08/06/15 0425  WBC 14.8* 12.3*  NEUTROABS 11.5*  --   HGB 13.0 11.4*  HCT 37.5* 33.0*  MCV 85.2 86.0  PLT 229 207    Cardiac Enzymes:  Recent Labs Lab 08/05/15 2155  TROPONINI <0.03    Lipid Panel: No results for input(s): CHOL, TRIG, HDL, CHOLHDL, VLDL, LDLCALC in the last 168 hours.  CBG:  Recent Labs Lab 08/07/15 1302 08/07/15 1628 08/07/15 2045 08/08/15 0006 08/08/15 0735  GLUCAP 261* 259* 223* 325* 218*    Microbiology: Results for orders placed or performed during the hospital encounter of 07/22/15  MRSA PCR Screening     Status: Abnormal   Collection Time: 07/22/15 12:24 AM  Result Value Ref Range Status   MRSA by PCR POSITIVE (A) NEGATIVE Final    Comment:        The GeneXpert MRSA Assay (FDA approved for NASAL specimens only), is one component of a comprehensive MRSA colonization surveillance program. It is not intended to diagnose MRSA infection nor to guide or monitor treatment for MRSA infections. RESULT CALLED TO, READ BACK BY AND VERIFIED WITH: CROFT,S RN [id number] AT 0238 SKEEN,P   Urine  culture     Status: None   Collection Time: 07/25/15 10:51 AM  Result Value Ref Range Status   Specimen Description URINE, CATHETERIZED  Final   Special Requests NONE  Final   Culture NO GROWTH  Final   Report Status 07/26/2015 FINAL  Final    Coagulation Studies: No results  for input(s): LABPROT, INR in the last 72 hours.  Imaging: Mr Laqueta Jean HQ Contrast  08/07/2015  CLINICAL DATA:  Seizure. Episode of confusion with similar episode 1 week prior as well. EXAM: MRI HEAD WITHOUT AND WITH CONTRAST TECHNIQUE: Multiplanar, multiecho pulse sequences of the brain and surrounding structures were obtained without and with intravenous contrast. CONTRAST:  20mL MULTIHANCE GADOBENATE DIMEGLUMINE 529 MG/ML IV SOLN COMPARISON:  Head CT 08/05/2015 and MRI 07/23/2015 FINDINGS: Multiple sequences are moderately motion degraded, including dedicated coronal oblique temporal lobe imaging which precludes detailed evaluation of the hippocampi. Mild T2 hyperintensity involving the left hippocampus is grossly unchanged and is without abnormal enhancement. There is no evidence of acute infarct, intracranial hemorrhage, mass, midline shift, or extra-axial fluid collection. There is mild generalized cerebral atrophy. Chronic lacunar infarcts are again seen in the pons and thalami. Scattered, small foci of T2 hyperintensity in the cerebral white matter are grossly unchanged and nonspecific but compatible with very mild chronic small vessel ischemic disease. No abnormal enhancement is identified. Prior bilateral cataract extraction is noted. At most minimal paranasal sinus mucosal thickening and trace mastoid fluid. Major intracranial vascular flow voids are preserved. IMPRESSION: 1. Moderately motion degraded examination without evidence of acute abnormality. 2. Mild T2 signal abnormality in the left hippocampus, grossly unchanged and nonspecific though not consistent with herpes encephalitis given lack of interval change and clinical presentation. Electronically Signed   By: Sebastian Ache M.D.   On: 08/07/2015 15:23    Medications:  I have reviewed the patient's current medications. Scheduled: . amLODipine  5 mg Oral Daily  . aspirin EC  81 mg Oral Daily  . Chlorhexidine Gluconate Cloth  6 each Topical  Q0600  . enoxaparin (LOVENOX) injection  40 mg Subcutaneous Q24H  . insulin aspart  0-15 Units Subcutaneous TID WC  . insulin aspart  0-5 Units Subcutaneous QHS  . insulin aspart  5 Units Subcutaneous TID WC  . [START ON 08/09/2015] insulin glargine  22 Units Subcutaneous Daily  . insulin glargine  7 Units Subcutaneous Once  . lacosamide  50 mg Oral BID  . loratadine  10 mg Oral Daily  . metoprolol tartrate  12.5 mg Oral BID  . mupirocin ointment  1 application Nasal BID  . nystatin   Topical BID  . pantoprazole  40 mg Oral BID  . rosuvastatin  10 mg Oral Daily  . sodium chloride flush  3 mL Intravenous Q12H  . tamsulosin  0.4 mg Oral Daily    Assessment/Plan: Patient stable.  MRI of the brain personally reviewed and continues to show left hippocampal T2 abnormalities.  Concern is for a seizure focus.  EEG unremarkable but likelihood remains high.  Recommend diagnostic trial of an anticonvulsant.    Recommendations: 1.  Vimpat  BID.  No load indicated.   2.  Patient to follow up with neurology on an outpatient basis.     LOS: 2 days   Thana Farr, MD Neurology [phone] 08/08/2015  12:12 PM

## 2015-08-08 NOTE — Progress Notes (Signed)
Pt to be discharged home today. His son Antiono Noland) contacted via phone to inform him of pt's pending discharge. Son expressed safety concerns at home, specifically he is concerned that the patient will try to drive or may be unsafe cooking food. I spoke to [place] (CCM) and she confirmed that the pt will not qualify for insurance to pay for placement at a SNF at this time. I updated the son and he is aware that he has the option to privately pay for have his father to be placed if he believes it necessary.

## 2015-08-08 NOTE — Evaluation (Signed)
Physical Therapy Evaluation Patient Details Name: Jay [name] MRN: [id number] DOB: 08-26-1945 Today's Date: 08/08/2015   History of Present Illness  70 yo M presented to ED with son who reported pt was confusion and had a "glazed over look" like his previous CVA. Pt is s/p CVA 2 weeks ago. He has now been admitted for possible seizures. PMH includes DM, angina, CAD, cardiac cath.  Clinical Impression  Pt demonstrated generalized weakness and difficulty walking during evaluation. His LE strength is grossly 4+/5. Standing balance is good including reaching out of BOS. He required min guard for bed mobility. Transfers completed without AD with mod I. Pt ambulated up to 225 ft without AD with supervision. Steady with fair to good speed. Pt will benefit from skilled PT to address functional deficits in preparation of returning home after discharge. He needs to practice steps. Pt will benefit from skilled PT services to increase functional I and mobility for safe discharge.     Follow Up Recommendations No PT follow up;Supervision - Intermittent    Equipment Recommendations  None recommended by PT    Recommendations for Other Services       Precautions / Restrictions Precautions Precautions: None Restrictions Weight Bearing Restrictions: No      Mobility  Bed Mobility Overal bed mobility: Needs Assistance Bed Mobility: Supine to Sit     Supine to sit: Min guard     General bed mobility comments: some assistance for HHA to pull trunk upright  Transfers Overall transfer level: Modified independent Equipment used: None Transfers: Sit to/from [employer] Sit to Stand: Modified independent (Device/Increase time) Stand pivot transfers: Modified independent (Device/Increase time)       General transfer comment:  (steady with no LOB)  Ambulation/Gait Ambulation/Gait assistance: Supervision Ambulation Distance (Feet): 225 Feet Assistive device: None   Gait  velocity: 1.7 ft/sec Gait velocity interpretation: <1.8 ft/sec, indicative of risk for recurrent falls General Gait Details: steady with no LOB, fair to good speed  Stairs            Wheelchair Mobility    Modified Rankin (Stroke Patients Only)       Balance Overall balance assessment: Modified Independent;No apparent balance deficits (not formally assessed) Sitting-balance support: No upper extremity supported Sitting balance-Leahy Scale: Good     Standing balance support: No upper extremity supported Standing balance-Leahy Scale: Good Standing balance comment: pt able to perform toileting tasks, reaching out of BOS with no LOB                             Pertinent Vitals/Pain Pain Assessment: No/denies pain    Home Living Family/patient expects to be discharged to:: Private residence Living Arrangements: Children Available Help at Discharge: Family;Available PRN/intermittently Type of Home: House Home Access: Stairs to enter Entrance Stairs-Rails: Right;Left;Can reach both Entrance Stairs-Number of Steps: 5 Home Layout: One level Home Equipment: None Additional Comments: Pt's wife is at the [facility]. Pt living with son.    Prior Function Level of Independence: Independent               Hand Dominance   Dominant Hand: Right    Extremity/Trunk Assessment   Upper Extremity Assessment: Overall WFL for tasks assessed           Lower Extremity Assessment: Overall WFL for tasks assessed (grossly 4+/5)         Communication   Communication: No difficulties  Cognition Arousal/Alertness:  Awake/alert Behavior During Therapy: WFL for tasks assessed/performed Overall Cognitive Status: Within Functional Limits for tasks assessed                      General Comments      Exercises Other Exercises Other Exercises: Pt ambulated 25 ft x2, 225 ft, and 150 ft without AD with supervision. Steady with fair to good speed. No LOB  or buckling.       Assessment/Plan    PT Assessment Patient needs continued PT services  PT Diagnosis Difficulty walking;Generalized weakness   PT Problem List Decreased strength;Decreased activity tolerance  PT Treatment Interventions Gait training;Stair training;Therapeutic activities;Therapeutic exercise;Balance training;Neuromuscular re-education;Patient/family education   PT Goals (Current goals can be found in the Care Plan section) Acute Rehab PT Goals Patient Stated Goal: go home PT Goal Formulation: With patient Time For Goal Achievement: 08/22/15 Potential to Achieve Goals: Good    Frequency Min 2X/week   Barriers to discharge Inaccessible home environment;Decreased caregiver support steps to enter, alone during the day    Co-evaluation               End of Session Equipment Utilized During Treatment: Gait belt Activity Tolerance: Patient tolerated treatment well Patient left: in chair;with call bell/phone within reach;with chair alarm set Nurse Communication: Mobility status    Functional Assessment Tool Used: clinical judgement, gait speed Functional Limitation: Mobility: Walking and moving around Mobility: Walking and Moving Around Current Status [phone]): At least 1 percent but less than 20 percent impaired, limited or restricted Mobility: Walking and Moving Around Goal Status [phone]): 0 percent impaired, limited or restricted    Time: 0902-0930 PT Time Calculation (min) (ACUTE ONLY): 28 min   Charges:   PT Evaluation $PT Eval Low Complexity: 1 Procedure PT Treatments $Gait Training: 8-22 mins   PT G Codes:   PT G-Codes **NOT FOR INPATIENT CLASS** Functional Assessment Tool Used: clinical judgement, gait speed Functional Limitation: Mobility: Walking and moving around Mobility: Walking and Moving Around Current Status (Q4696): At least 1 percent but less than 20 percent impaired, limited or restricted Mobility: Walking and Moving Around Goal  Status [phone]): 0 percent impaired, limited or restricted    Adelene Idler, PT, DPT  08/08/2015, 11:02 AM [phone]

## 2015-08-08 NOTE — Progress Notes (Signed)
Patient is to be discharged home today. Patient is in no acute distress at this time, and assessment is unchanged from this morning. Patient's IV is out, discharge paperwork has been discussed with patient/family and there are no questions or concerns at this time. Patient will be accompanied downstairs by staff and family via wheelchair.   

## 2015-08-08 NOTE — Discharge Summary (Signed)
[facility] [place] [facility]   PATIENT NAME: Jay [name]    MR#:  [id number]  DATE OF BIRTH:  [birth year]  DATE OF ADMISSION:  08/05/2015 ADMITTING PHYSICIAN: Oralia Manis, MD  DATE OF DISCHARGE: 08/08/15  PRIMARY CARE PHYSICIAN: Megan Mans, MD    ADMISSION DIAGNOSIS:  Seizure (HCC) [R56.9]  DISCHARGE DIAGNOSIS:  Principal Problem:   Seizures (HCC) Active Problems:   CAD in native artery   Depression, major, recurrent (HCC)   HLD (hyperlipidemia)   Benign essential HTN   Type 2 diabetes mellitus with hyperglycemia (HCC)   SECONDARY DIAGNOSIS:   Past Medical History  Diagnosis Date  . Diabetes mellitus without complication (HCC)   . Hypertension   . Anemia   . Angina pectoris (HCC)   . CAD (coronary artery disease)   . Foot deformities, congenital   . Fatty liver   . GERD (gastroesophageal reflux disease)   . Bilateral diabetic retinopathy (HCC)   . Morbid obesity (HCC)   . Pedal edema   . Sinoatrial node dysfunction (HCC)   . Sleep apnea     not tolerating CPAP    HOSPITAL COURSE:   Jay [name] is a 70 y.o. male who presents with Possible seizure activity. Patient was recently admitted to the hospital at [facility] with suspected stroke for an episode similar to this admission- glazed look.  1. Altered mental status- Seizures - simple partial? - patient had a similar episode a couple weeks ago and was worked up for stroke, was a questionable signal in the left hippocampus area unsure from seizures or any encephalitis. -Remote tiny infarcts were noted as well. - Appreciate neurology consult. Repeat MRI of the brain with and without contrast showing the same focus in left hippocampus- unchanged from recent MRI- less likely to be encephalitis as patient has not been febrile since discharge without meds and no change on MRI. -EEG normal - started on vimpat- driving restrictions until seen by outpatient neurology -  baseline mental status, some occasional short term memory loss.  2. CAD in native artery - stable. Recent cardiac catheter done about a month ago showing minimal three-vessel disease and occlusion is less than 30%. That was done for unstable angina pain. - no symptoms now -continue home meds-on aspirin, statin and metoprolol  3 Benign essential HTN - currently at goal, continue home meds-on metoprolol and norvasc   4. Type 2 diabetes mellitus with hyperglycemia (HCC) - sugars were in 200 range in the hospital while on lantus 22 units- change to home levemir 35units BID, novolog tid,  -A1c of 12.4   5.BPH-continue Flomax   6.NASH- continue lactulose bid  PT recommended home- discharge today and resume home health services  DISCHARGE CONDITIONS:   Stable  CONSULTS OBTAINED:  Treatment Team:  Pauletta Browns, MD  DRUG ALLERGIES:   Allergies  Allergen Reactions  . Metformin Nausea Only    DISCHARGE MEDICATIONS:   Current Discharge Medication List    START taking these medications   Details  amLODipine (NORVASC) 5 MG tablet Take 1 tablet (5 mg total) by mouth daily. Qty: 30 tablet, Refills: 2    lacosamide (VIMPAT) 50 MG TABS tablet Take 1 tablet (50 mg total) by mouth 2 (two) times daily. Qty: 60 tablet, Refills: 2      CONTINUE these medications which have CHANGED   Details  insulin detemir (LEVEMIR) 100 UNIT/ML injection Inject 0.35 mLs (35 Units total) into the skin 2 (two) times  daily. Qty: 10 mL, Refills: 0    lactulose (CHRONULAC) 10 GM/15ML solution Take 30 mLs (20 g total) by mouth 2 (two) times daily. Qty: 946 mL, Refills: 0    meclizine (ANTIVERT) 25 MG tablet TAKE 1 TABLET BY MOUTH 3 TIMES DAILY AS NEEDED FOR DIZZINESS Qty: 90 tablet, Refills: 5      CONTINUE these medications which have NOT CHANGED   Details  aspirin 81 MG tablet Take 1 tablet by mouth daily.    ferrous sulfate 325 (65 FE) MG tablet Take 1 tablet (325 mg total) by mouth 2  (two) times daily. Qty: 60 tablet, Refills: 2    insulin aspart (NOVOLOG) 100 UNIT/ML injection Inject 8 Units into the skin 3 (three) times daily with meals. Qty: 10 mL, Refills: 0    loratadine (CLARITIN) 10 MG tablet Take 1 tablet (10 mg total) by mouth daily. Qty: 30 tablet, Refills: 11   Associated Diagnoses: Allergic rhinitis, unspecified allergic rhinitis type    metoprolol tartrate (LOPRESSOR) 25 MG tablet Take 0.5 tablets (12.5 mg total) by mouth 2 (two) times daily. Qty: 60 tablet, Refills: 0    mometasone (ELOCON) 0.1 % cream Apply 1 application topically daily. For 2 weeks    pantoprazole (PROTONIX) 40 MG tablet TAKE 1 TABLET BY MOUTH TWICE DAILY. Qty: 60 tablet, Refills: 5   Associated Diagnoses: Gastroesophageal reflux disease without esophagitis    promethazine (PHENERGAN) 25 MG tablet Take 1 tablet (25 mg total) by mouth every 4 (four) hours as needed for nausea or vomiting. Qty: 100 tablet, Refills: 2   Associated Diagnoses: Dizziness    rosuvastatin (CRESTOR) 10 MG tablet Take 1 tablet (10 mg total) by mouth daily. Qty: 30 tablet, Refills: 5    tamsulosin (FLOMAX) 0.4 MG CAPS capsule Take 1 capsule (0.4 mg total) by mouth daily. Qty: 30 capsule, Refills: 3   Associated Diagnoses: BPH (benign prostatic hyperplasia)      STOP taking these medications     glimepiride (AMARYL) 4 MG tablet      TRADJENTA 5 MG TABS tablet          DISCHARGE INSTRUCTIONS:   1. PCP f/u in 2 weeks 2. Neurology f/u in 2 weeks 3. NO DRIVING until cleared by outpatient neurology  If you experience worsening of your admission symptoms, develop shortness of breath, life threatening emergency, suicidal or homicidal thoughts you must seek medical attention immediately by calling 911 or calling your MD immediately  if symptoms less severe.  You Must read complete instructions/literature along with all the possible adverse reactions/side effects for all the Medicines you take and that  have been prescribed to you. Take any new Medicines after you have completely understood and accept all the possible adverse reactions/side effects.   Please note  You were cared for by a hospitalist during your hospital stay. If you have any questions about your discharge medications or the care you received while you were in the hospital after you are discharged, you can call the unit and asked to speak with the hospitalist on call if the hospitalist that took care of you is not available. Once you are discharged, your primary care physician will handle any further medical issues. Please note that NO REFILLS for any discharge medications will be authorized once you are discharged, as it is imperative that you return to your primary care physician (or establish a relationship with a primary care physician if you do not have one) for your aftercare needs so  that they can reassess your need for medications and monitor your lab values.    Today   CHIEF COMPLAINT:   Chief Complaint  Patient presents with  . Code Stroke    VITAL SIGNS:  Blood pressure 134/69, pulse 73, temperature 97.6 F (36.4 C), temperature source Oral, resp. rate 18, height  (1.753 m), weight 108.863 kg (240 lb), SpO2 97 %.  I/O:   Intake/Output Summary (Last 24 hours) at 08/08/15 1328 Last data filed at 08/07/15 1738  Gross per 24 hour  Intake      0 ml  Output    400 ml  Net   -400 ml    PHYSICAL EXAMINATION:   Physical Exam  GENERAL: 70 y.o.-year-old obese patient lying in the bed with no acute distress.  EYES: Pupils equal, round, reactive to light and accommodation. No scleral icterus. Extraocular muscles intact.  HEENT: Head atraumatic, normocephalic. Oropharynx and nasopharynx clear.  NECK: Supple, no jugular venous distention. No thyroid enlargement, no tenderness.  LUNGS: Normal breath sounds bilaterally, no wheezing, rales,rhonchi or crepitation. No use of accessory muscles of respiration.  Decreased bibasilar breath sounds CARDIOVASCULAR: S1, S2 normal. No murmurs, rubs, or gallops.  ABDOMEN: Soft, obese, nontender, nondistended. Bowel sounds present. No organomegaly or mass.  EXTREMITIES: No pedal edema, cyanosis, or clubbing.  NEUROLOGIC: Cranial nerves II through XII are intact. Muscle strength 5/5 in all extremities. Sensation intact. Gait not checked.  PSYCHIATRIC: The patient is alert and oriented x 3. occasional memory gaps SKIN: No obvious rash, lesion, or ulcer.   DATA REVIEW:   CBC  Recent Labs Lab 08/06/15 0425  WBC 12.3*  HGB 11.4*  HCT 33.0*  PLT 207    Chemistries   Recent Labs Lab 08/05/15 2155  08/08/15 0637  NA 131*  < > 133*  K 4.0  < > 4.6  CL 94*  < > 96*  CO2 27  < > 31  GLUCOSE 158*  < > 204*  BUN 20  < > 24*  CREATININE 1.34*  < > 1.10  CALCIUM 9.1  < > 8.6*  AST 45*  --   --   ALT 58  --   --   ALKPHOS 169*  --   --   BILITOT 0.6  --   --   < > = values in this interval not displayed.  Cardiac Enzymes  Recent Labs Lab 08/05/15 2155  TROPONINI <0.03    Microbiology Results  Results for orders placed or performed during the hospital encounter of 07/22/15  MRSA PCR Screening     Status: Abnormal   Collection Time: 07/22/15 12:24 AM  Result Value Ref Range Status   MRSA by PCR POSITIVE (A) NEGATIVE Final    Comment:        The GeneXpert MRSA Assay (FDA approved for NASAL specimens only), is one component of a comprehensive MRSA colonization surveillance program. It is not intended to diagnose MRSA infection nor to guide or monitor treatment for MRSA infections. RESULT CALLED TO, READ BACK BY AND VERIFIED WITH: CROFT,S RN [id number] AT 0238 SKEEN,P   Urine culture     Status: None   Collection Time: 07/25/15 10:51 AM  Result Value Ref Range Status   Specimen Description URINE, CATHETERIZED  Final   Special Requests NONE  Final   Culture NO GROWTH  Final   Report Status 07/26/2015 FINAL  Final    RADIOLOGY:   Mr Lodema Pilot Contrast  08/07/2015  CLINICAL  DATA:  Seizure. Episode of confusion with similar episode 1 week prior as well. EXAM: MRI HEAD WITHOUT AND WITH CONTRAST TECHNIQUE: Multiplanar, multiecho pulse sequences of the brain and surrounding structures were obtained without and with intravenous contrast. CONTRAST:  35mL MULTIHANCE GADOBENATE DIMEGLUMINE 529 MG/ML IV SOLN COMPARISON:  Head CT 08/05/2015 and MRI 07/23/2015 FINDINGS: Multiple sequences are moderately motion degraded, including dedicated coronal oblique temporal lobe imaging which precludes detailed evaluation of the hippocampi. Mild T2 hyperintensity involving the left hippocampus is grossly unchanged and is without abnormal enhancement. There is no evidence of acute infarct, intracranial hemorrhage, mass, midline shift, or extra-axial fluid collection. There is mild generalized cerebral atrophy. Chronic lacunar infarcts are again seen in the pons and thalami. Scattered, small foci of T2 hyperintensity in the cerebral white matter are grossly unchanged and nonspecific but compatible with very mild chronic small vessel ischemic disease. No abnormal enhancement is identified. Prior bilateral cataract extraction is noted. At most minimal paranasal sinus mucosal thickening and trace mastoid fluid. Major intracranial vascular flow voids are preserved. IMPRESSION: 1. Moderately motion degraded examination without evidence of acute abnormality. 2. Mild T2 signal abnormality in the left hippocampus, grossly unchanged and nonspecific though not consistent with herpes encephalitis given lack of interval change and clinical presentation. Electronically Signed   By: Sebastian Ache M.D.   On: 08/07/2015 15:23    EKG:   Orders placed or performed during the hospital encounter of 08/05/15  . ED EKG  . ED EKG      Management plans discussed with the patient, family and they are in agreement.  CODE STATUS:     Code Status Orders        Start      Ordered   08/06/15 0133  Full code   Continuous     08/06/15 0132    Code Status History    Date Active Date Inactive Code Status Order ID Comments User Context   07/22/2015 12:52 AM 07/27/2015  7:56 PM Full Code [id number]  Coralyn Helling, MD Inpatient   07/10/2015  9:09 AM 07/10/2015  1:13 PM Full Code [id number]  Lamar Blinks, MD Inpatient      TOTAL TIME TAKING CARE OF THIS PATIENT: 37 minutes.    Kaylum Shrum M.D on 08/08/2015 at 1:28 PM  Between 7am to 6pm - Pager - [phone]  After 6pm go to [email or web address] - password [email or web address] [facility]  [facility]  Office  [phone]  CC: Primary care physician; Megan Mans, MD

## 2015-08-08 NOTE — Care Management (Signed)
Discharge to home today per Dr. Nemiah Commander. Will resume services through [facility]. Feliberto Gottron [facility] representative updated. Family will transport. Gwenette Greet RN MSN CCM Care Management [phone]

## 2015-08-08 NOTE — Care Management Obs Status (Signed)
MEDICARE OBSERVATION STATUS NOTIFICATION   Patient Details  Name: BIRT BIEDRZYCKI MRN: [id number] Date of Birth: [birth year]   Medicare Observation Status Notification Given:  Yes (Code 28)    Gwenette Greet, RN 08/08/2015, 8:07 AM

## 2015-08-10 DIAGNOSIS — I129 Hypertensive chronic kidney disease with stage 1 through stage 4 chronic kidney disease, or unspecified chronic kidney disease: Secondary | ICD-10-CM | POA: Diagnosis not present

## 2015-08-10 DIAGNOSIS — M6281 Muscle weakness (generalized): Secondary | ICD-10-CM | POA: Diagnosis not present

## 2015-08-10 DIAGNOSIS — R2681 Unsteadiness on feet: Secondary | ICD-10-CM | POA: Diagnosis not present

## 2015-08-10 DIAGNOSIS — N182 Chronic kidney disease, stage 2 (mild): Secondary | ICD-10-CM | POA: Diagnosis not present

## 2015-08-10 DIAGNOSIS — E1165 Type 2 diabetes mellitus with hyperglycemia: Secondary | ICD-10-CM | POA: Diagnosis not present

## 2015-08-10 DIAGNOSIS — Z7982 Long term (current) use of aspirin: Secondary | ICD-10-CM | POA: Diagnosis not present

## 2015-08-10 DIAGNOSIS — I2510 Atherosclerotic heart disease of native coronary artery without angina pectoris: Secondary | ICD-10-CM | POA: Diagnosis not present

## 2015-08-10 DIAGNOSIS — Z8673 Personal history of transient ischemic attack (TIA), and cerebral infarction without residual deficits: Secondary | ICD-10-CM | POA: Diagnosis not present

## 2015-08-10 DIAGNOSIS — K7460 Unspecified cirrhosis of liver: Secondary | ICD-10-CM | POA: Diagnosis not present

## 2015-08-10 DIAGNOSIS — Z794 Long term (current) use of insulin: Secondary | ICD-10-CM | POA: Diagnosis not present

## 2015-08-10 DIAGNOSIS — Z955 Presence of coronary angioplasty implant and graft: Secondary | ICD-10-CM | POA: Diagnosis not present

## 2015-08-10 DIAGNOSIS — E1122 Type 2 diabetes mellitus with diabetic chronic kidney disease: Secondary | ICD-10-CM | POA: Diagnosis not present

## 2015-08-14 ENCOUNTER — Inpatient Hospital Stay: Payer: Self-pay | Admitting: Family Medicine

## 2015-08-15 NOTE — Telephone Encounter (Signed)
Okay. Patient also needs follow-up visit here at some point in time.

## 2015-08-15 NOTE — Telephone Encounter (Signed)
Cindy advised on voicemail, pt has f/u appt on 17th-aa

## 2015-08-16 DIAGNOSIS — Z7982 Long term (current) use of aspirin: Secondary | ICD-10-CM | POA: Diagnosis not present

## 2015-08-16 DIAGNOSIS — N182 Chronic kidney disease, stage 2 (mild): Secondary | ICD-10-CM | POA: Diagnosis not present

## 2015-08-16 DIAGNOSIS — M6281 Muscle weakness (generalized): Secondary | ICD-10-CM | POA: Diagnosis not present

## 2015-08-16 DIAGNOSIS — Z794 Long term (current) use of insulin: Secondary | ICD-10-CM | POA: Diagnosis not present

## 2015-08-16 DIAGNOSIS — Z8673 Personal history of transient ischemic attack (TIA), and cerebral infarction without residual deficits: Secondary | ICD-10-CM | POA: Diagnosis not present

## 2015-08-16 DIAGNOSIS — I129 Hypertensive chronic kidney disease with stage 1 through stage 4 chronic kidney disease, or unspecified chronic kidney disease: Secondary | ICD-10-CM | POA: Diagnosis not present

## 2015-08-16 DIAGNOSIS — E1122 Type 2 diabetes mellitus with diabetic chronic kidney disease: Secondary | ICD-10-CM | POA: Diagnosis not present

## 2015-08-16 DIAGNOSIS — I2510 Atherosclerotic heart disease of native coronary artery without angina pectoris: Secondary | ICD-10-CM | POA: Diagnosis not present

## 2015-08-16 DIAGNOSIS — E1165 Type 2 diabetes mellitus with hyperglycemia: Secondary | ICD-10-CM | POA: Diagnosis not present

## 2015-08-16 DIAGNOSIS — Z955 Presence of coronary angioplasty implant and graft: Secondary | ICD-10-CM | POA: Diagnosis not present

## 2015-08-16 DIAGNOSIS — R2681 Unsteadiness on feet: Secondary | ICD-10-CM | POA: Diagnosis not present

## 2015-08-16 DIAGNOSIS — K7460 Unspecified cirrhosis of liver: Secondary | ICD-10-CM | POA: Diagnosis not present

## 2015-08-19 ENCOUNTER — Encounter: Payer: Self-pay | Admitting: Family Medicine

## 2015-08-19 ENCOUNTER — Ambulatory Visit: Payer: PPO | Admitting: *Deleted

## 2015-08-19 ENCOUNTER — Other Ambulatory Visit: Payer: Self-pay | Admitting: *Deleted

## 2015-08-19 ENCOUNTER — Ambulatory Visit (INDEPENDENT_AMBULATORY_CARE_PROVIDER_SITE_OTHER): Payer: PPO | Admitting: Family Medicine

## 2015-08-19 VITALS — BP 124/62 | HR 84 | Temp 98.0°F | Resp 20 | Wt 273.0 lb

## 2015-08-19 DIAGNOSIS — E1311 Other specified diabetes mellitus with ketoacidosis with coma: Secondary | ICD-10-CM | POA: Diagnosis not present

## 2015-08-19 DIAGNOSIS — R569 Unspecified convulsions: Secondary | ICD-10-CM | POA: Diagnosis not present

## 2015-08-19 DIAGNOSIS — Z794 Long term (current) use of insulin: Secondary | ICD-10-CM | POA: Diagnosis not present

## 2015-08-19 DIAGNOSIS — E1111 Type 2 diabetes mellitus with ketoacidosis with coma: Secondary | ICD-10-CM

## 2015-08-19 MED ORDER — INSULIN DETEMIR 100 UNIT/ML ~~LOC~~ SOLN: 35.0000 [IU] | Freq: Two times a day (BID) | SUBCUTANEOUS | Status: DC

## 2015-08-19 MED ORDER — INSULIN ASPART 100 UNIT/ML ~~LOC~~ SOLN: SUBCUTANEOUS | Status: DC

## 2015-08-19 NOTE — Patient Outreach (Signed)
[facility]) Care Management  08/19/2015  Jay [name] 23-Dec-1945 [id number]  Subjective: Telephone call to patient's home number, no answer, left HIPAA compliant voicemail message, and requested call back.  Objective: Per chart review: Patient hospitalized  08/05/15  -  08/08/15 with seizure.   Hospitalized  07/22/15 - 07/27/15 with possible stroke and acute encephalopathy.    Patient also has a history of : diabetes, hyperlipidemia, hypertension, and CAD.   Patient receiving home health physical therapy through [facility].   [facility] requested home health RN for medication management.   Assessment: Received [facility] Referral on  08/12/15.   Referral source: Jay [name].    Referral reason: Disease and symptom management.  Trigger 08/09/15, high risk for readmission, observation admit for seizure, had a recent inpatient and skilled nursing facility stay.    Services requested: [facility] RNCM.   Telephone screen pending, patient contact.    Plan: RNCM will call patient for 2nd attempt telephone outreach, telephone screen, within 10 business days, if no return call.   Jay Dowson H. Jay [name], BSN, CCM [facility] Care Management [facility] Telephonic CM Phone: [phone] Fax: [phone]

## 2015-08-19 NOTE — Progress Notes (Addendum)
Patient: Jay [name] Male    DOB: [birth year]   70 y.o.   MRN: [id number] Visit Date: 08/19/2015  Today's Provider: Megan Mans, MD   Chief Complaint  Patient presents with  . Hospitalization Follow-up   Subjective:    HPI     Follow up Hospitalization  Patient was admitted to [facility] on 08/05/2015 and discharged on 08/08/2015 from [facility]. He was treated for seizures. Treatment for this included EEG (normal) and referral to neurology. A1C was 12.4%. Changed Lantus to Levemir 35 u and Novolog TID. He reports good compliance with treatment. Has run out of Novolog. He reports this condition is Improved.  ------------------------------------------------------------------------------------   Allergies  Allergen Reactions  . Metformin Nausea Only   Current Meds  Medication Sig  . amLODipine (NORVASC) 5 MG tablet Take 1 tablet (5 mg total) by mouth daily.  Marland Kitchen aspirin 81 MG tablet Take 1 tablet by mouth daily.  . ferrous sulfate 325 (65 FE) MG tablet Take 1 tablet (325 mg total) by mouth 2 (two) times daily.  . insulin aspart (NOVOLOG) 100 UNIT/ML injection Inject 8 Units into the skin 3 (three) times daily with meals.  . insulin detemir (LEVEMIR) 100 UNIT/ML injection Inject 0.35 mLs (35 Units total) into the skin 2 (two) times daily.  Marland Kitchen lacosamide (VIMPAT) 50 MG TABS tablet Take 1 tablet (50 mg total) by mouth 2 (two) times daily.  Marland Kitchen lactulose (CHRONULAC) 10 GM/15ML solution Take 30 mLs (20 g total) by mouth 2 (two) times daily.  Marland Kitchen loratadine (CLARITIN) 10 MG tablet Take 1 tablet (10 mg total) by mouth daily.  . meclizine (ANTIVERT) 25 MG tablet TAKE 1 TABLET BY MOUTH 3 TIMES DAILY AS NEEDED FOR DIZZINESS  . metoprolol tartrate (LOPRESSOR) 25 MG tablet Take 0.5 tablets (12.5 mg total) by mouth 2 (two) times daily.  . mometasone (ELOCON) 0.1 % cream Apply 1 application topically daily. For 2 weeks  . pantoprazole (PROTONIX) 40 MG tablet TAKE 1 TABLET BY MOUTH  TWICE DAILY.  Marland Kitchen promethazine (PHENERGAN) 25 MG tablet Take 1 tablet (25 mg total) by mouth every 4 (four) hours as needed for nausea or vomiting.  . rosuvastatin (CRESTOR) 10 MG tablet Take 1 tablet (10 mg total) by mouth daily.  . tamsulosin (FLOMAX) 0.4 MG CAPS capsule Take 1 capsule (0.4 mg total) by mouth daily.    Review of Systems  Constitutional: Positive for fatigue and unexpected weight change. Negative for fever, chills, diaphoresis, activity change and appetite change.  Eyes: Negative.   Respiratory: Negative.   Cardiovascular: Negative for chest pain, palpitations and leg swelling.  Gastrointestinal: Negative.   Endocrine: Negative.   Allergic/Immunologic: Negative.   Neurological: Positive for seizures (has not had seizure since being hospitalized) and weakness.       Dizziness from 2 monthjs ago completely resolved. "I just dont feel right."  Psychiatric/Behavioral: Positive for confusion and decreased concentration.       Not as alert--no specific issues.    Social History  Substance Use Topics  . Smoking status: Never Smoker   . Smokeless tobacco: Never Used  . Alcohol Use: No   Objective:   BP 124/62 mmHg  Pulse 84  Temp(Src) 98 F (36.7 C) (Oral)  Resp 20  Wt 273 lb (123.832 kg)  Physical Exam  Constitutional: He appears well-developed and well-nourished.  Pleasant morbidly obese WMNAD.  HENT:  Head: Normocephalic and atraumatic.  Right Ear: External ear normal.  Left  Ear: External ear normal.  Nose: Nose normal.  Eyes: Conjunctivae are normal. No scleral icterus.  Neck: No thyromegaly present.  Cardiovascular: Normal rate and regular rhythm.   Murmur (Harsh 2/6 systolic murmur) heard. Pulmonary/Chest: Effort normal and breath sounds normal. No respiratory distress.  Abdominal: Soft.  Lymphadenopathy:    He has no cervical adenopathy.  Neurological: He is alert. No cranial nerve deficit. He exhibits normal muscle tone.  Skin: Skin is warm and dry.   Psychiatric: He has a normal mood and affect. His behavior is normal.        Assessment & Plan:     1. Uncontrolled type 2 diabetes mellitus with ketoacidotic coma, with long-term current use of insulin (HCC) A1C today is 12.4.Patient has very poor control of his diabetes but he does not follow any type of diet at all and gets no exercise whatsoever. We will increase his insulin slowly to avoid hypoglycemia but dietary changes would greatly help this patient. Refills provided. Will recheck in 3 months. - insulin aspart (NOVOLOG) 100 UNIT/ML injection; Up to 10 units with each meal  Dispense: 10 mL; Refill: 11 - insulin detemir (LEVEMIR) 100 UNIT/ML injection; Inject 0.35 mLs (35 Units total) into the skin 2 (two) times daily.  Dispense: 10 mL; Refill: 11 Discussed with patient that we need to really try to get his blood sugars down without causing hypoglycemia. The issue is that I am not sure that he is motivated at all to make any changes with his lifestyle. 2. Seizures (HCC) Stable. FU with neurology as scheduled.  3.Altered mental status Initially I thought this was a posterior fossa stroke. This is not the case. Clinically this most visits with an encephalopathy. Likely due to inflammation from years of diabetes and vascular disease. Patient has neurology follow-up. 4. Ischemic cardiomyopathy Presently stable and all risk factors treated. 5. Morbid obesity I have done the exam and reviewed the above chart and it is accurate to the best of my knowledge.     Patient seen and examined by Julieanne Manson, MD, and note scribed by Allene Dillon, CMA.   Jay Wendelyn Breslow, MD  [facility]

## 2015-08-20 ENCOUNTER — Other Ambulatory Visit: Payer: Self-pay | Admitting: *Deleted

## 2015-08-20 DIAGNOSIS — I129 Hypertensive chronic kidney disease with stage 1 through stage 4 chronic kidney disease, or unspecified chronic kidney disease: Secondary | ICD-10-CM | POA: Diagnosis not present

## 2015-08-20 DIAGNOSIS — Z8673 Personal history of transient ischemic attack (TIA), and cerebral infarction without residual deficits: Secondary | ICD-10-CM | POA: Diagnosis not present

## 2015-08-20 DIAGNOSIS — Z955 Presence of coronary angioplasty implant and graft: Secondary | ICD-10-CM | POA: Diagnosis not present

## 2015-08-20 DIAGNOSIS — K7460 Unspecified cirrhosis of liver: Secondary | ICD-10-CM | POA: Diagnosis not present

## 2015-08-20 DIAGNOSIS — E1122 Type 2 diabetes mellitus with diabetic chronic kidney disease: Secondary | ICD-10-CM | POA: Diagnosis not present

## 2015-08-20 DIAGNOSIS — N182 Chronic kidney disease, stage 2 (mild): Secondary | ICD-10-CM | POA: Diagnosis not present

## 2015-08-20 DIAGNOSIS — M6281 Muscle weakness (generalized): Secondary | ICD-10-CM | POA: Diagnosis not present

## 2015-08-20 DIAGNOSIS — Z794 Long term (current) use of insulin: Secondary | ICD-10-CM | POA: Diagnosis not present

## 2015-08-20 DIAGNOSIS — E1165 Type 2 diabetes mellitus with hyperglycemia: Secondary | ICD-10-CM | POA: Diagnosis not present

## 2015-08-20 DIAGNOSIS — I2510 Atherosclerotic heart disease of native coronary artery without angina pectoris: Secondary | ICD-10-CM | POA: Diagnosis not present

## 2015-08-20 DIAGNOSIS — R2681 Unsteadiness on feet: Secondary | ICD-10-CM | POA: Diagnosis not present

## 2015-08-20 DIAGNOSIS — Z7982 Long term (current) use of aspirin: Secondary | ICD-10-CM | POA: Diagnosis not present

## 2015-08-20 NOTE — Patient Outreach (Addendum)
[facility]) Care Management  08/20/2015  Jay [name] [birth year] [id number]  Subjective: Telephone call to patient's home number [phone]), spoke with patient's son Jay [name], left HIPAA compliant message with son for patient, and son states number called is son's mobile number.   Son states RNCM may be able to reach patient at other chart listed home number [phone]) after 3:00pm today.   Objective: Per chart review: Patient hospitalized 08/05/15 - 08/08/15 with seizure. Hospitalized 07/22/15 - 07/27/15 with possible stroke and acute encephalopathy. Patient also has a history of : diabetes, hyperlipidemia, hypertension, and CAD. Patient receiving home health physical therapy through [facility]. [facility] requested home health RN for medication management.   Assessment: Received [facility] Referral on 08/12/15. Referral source: Jay [name]. Referral reason: Disease and symptom management. Trigger 08/09/15, high risk for readmission, observation admit for seizure, had a recent inpatient and skilled nursing facility stay. Services requested: [facility] Care Management Community RNCM. Telephone screen pending, patient contact.   Plan: RNCM will call patient for 3rd attempt telephone outreach, telephone screen, within 10 business days, if no return call. RNCM will send request to update in chart patient's home number [phone] to patient's son Jay [name])  mobile number to Jay [name] at [name].   Other home number [phone]) listed in chart is patient's home number per patient's son Jay [name].    Jay [name] H. [name], BSN, CCM [facility] Care Management [name] Telephonic CM Phone: [phone] Fax: [phone]

## 2015-08-21 ENCOUNTER — Encounter: Payer: Self-pay | Admitting: *Deleted

## 2015-08-21 ENCOUNTER — Other Ambulatory Visit: Payer: Self-pay | Admitting: *Deleted

## 2015-08-21 NOTE — Patient Outreach (Signed)
[facility]) Care Management  08/21/2015  Jay [name] [birth year] [id number]   Subjective: Telephone call to patient's home number [phone]), no answer, no answering machine / voicemail set up, and unable to leave message.    Objective: Per chart review: Patient hospitalized 08/05/15 - 08/08/15 with seizure. Hospitalized 07/22/15 - 07/27/15 with possible stroke and acute encephalopathy. Patient also has a history of : diabetes, hyperlipidemia, hypertension, and CAD. Patient receiving home health physical therapy through [facility]. [facility] requested home health RN for medication management.   Assessment: Received [facility] Referral on 08/12/15. Referral source: Kathrine Haddock. Referral reason: Disease and symptom management. Trigger 08/09/15, high risk for readmission, observation admit for seizure, had a recent inpatient and skilled nursing facility stay. Services requested: [facility] Care Management Community RNCM. Telephone screen pending, patient contact.   Plan: RNCM will send patient unsuccessful outreach letter, [facility] pamphlet, and will proceed with case closure if no return call from patient, within 10 business days.    Oksana Deberry H. [name], BSN, CCM [facility] Care Management [facility] Telephonic CM Phone: [phone] Fax: [phone]

## 2015-08-22 DIAGNOSIS — E1165 Type 2 diabetes mellitus with hyperglycemia: Secondary | ICD-10-CM | POA: Diagnosis not present

## 2015-08-22 DIAGNOSIS — E1122 Type 2 diabetes mellitus with diabetic chronic kidney disease: Secondary | ICD-10-CM | POA: Diagnosis not present

## 2015-08-22 DIAGNOSIS — Z8673 Personal history of transient ischemic attack (TIA), and cerebral infarction without residual deficits: Secondary | ICD-10-CM | POA: Diagnosis not present

## 2015-08-22 DIAGNOSIS — Z7982 Long term (current) use of aspirin: Secondary | ICD-10-CM | POA: Diagnosis not present

## 2015-08-22 DIAGNOSIS — I2510 Atherosclerotic heart disease of native coronary artery without angina pectoris: Secondary | ICD-10-CM | POA: Diagnosis not present

## 2015-08-22 DIAGNOSIS — Z955 Presence of coronary angioplasty implant and graft: Secondary | ICD-10-CM | POA: Diagnosis not present

## 2015-08-22 DIAGNOSIS — M6281 Muscle weakness (generalized): Secondary | ICD-10-CM | POA: Diagnosis not present

## 2015-08-22 DIAGNOSIS — Z794 Long term (current) use of insulin: Secondary | ICD-10-CM | POA: Diagnosis not present

## 2015-08-22 DIAGNOSIS — N182 Chronic kidney disease, stage 2 (mild): Secondary | ICD-10-CM | POA: Diagnosis not present

## 2015-08-22 DIAGNOSIS — K7460 Unspecified cirrhosis of liver: Secondary | ICD-10-CM | POA: Diagnosis not present

## 2015-08-22 DIAGNOSIS — I129 Hypertensive chronic kidney disease with stage 1 through stage 4 chronic kidney disease, or unspecified chronic kidney disease: Secondary | ICD-10-CM | POA: Diagnosis not present

## 2015-08-22 DIAGNOSIS — R2681 Unsteadiness on feet: Secondary | ICD-10-CM | POA: Diagnosis not present

## 2015-08-25 DIAGNOSIS — Z794 Long term (current) use of insulin: Secondary | ICD-10-CM | POA: Diagnosis not present

## 2015-08-25 DIAGNOSIS — R2681 Unsteadiness on feet: Secondary | ICD-10-CM | POA: Diagnosis not present

## 2015-08-25 DIAGNOSIS — Z955 Presence of coronary angioplasty implant and graft: Secondary | ICD-10-CM | POA: Diagnosis not present

## 2015-08-25 DIAGNOSIS — N182 Chronic kidney disease, stage 2 (mild): Secondary | ICD-10-CM | POA: Diagnosis not present

## 2015-08-25 DIAGNOSIS — Z7982 Long term (current) use of aspirin: Secondary | ICD-10-CM | POA: Diagnosis not present

## 2015-08-25 DIAGNOSIS — Z8673 Personal history of transient ischemic attack (TIA), and cerebral infarction without residual deficits: Secondary | ICD-10-CM | POA: Diagnosis not present

## 2015-08-25 DIAGNOSIS — E1165 Type 2 diabetes mellitus with hyperglycemia: Secondary | ICD-10-CM | POA: Diagnosis not present

## 2015-08-25 DIAGNOSIS — I129 Hypertensive chronic kidney disease with stage 1 through stage 4 chronic kidney disease, or unspecified chronic kidney disease: Secondary | ICD-10-CM | POA: Diagnosis not present

## 2015-08-25 DIAGNOSIS — E1122 Type 2 diabetes mellitus with diabetic chronic kidney disease: Secondary | ICD-10-CM | POA: Diagnosis not present

## 2015-08-25 DIAGNOSIS — I2510 Atherosclerotic heart disease of native coronary artery without angina pectoris: Secondary | ICD-10-CM | POA: Diagnosis not present

## 2015-08-25 DIAGNOSIS — M6281 Muscle weakness (generalized): Secondary | ICD-10-CM | POA: Diagnosis not present

## 2015-08-25 DIAGNOSIS — K7460 Unspecified cirrhosis of liver: Secondary | ICD-10-CM | POA: Diagnosis not present

## 2015-08-27 ENCOUNTER — Telehealth: Payer: Self-pay | Admitting: Emergency Medicine

## 2015-08-27 DIAGNOSIS — E1122 Type 2 diabetes mellitus with diabetic chronic kidney disease: Secondary | ICD-10-CM | POA: Diagnosis not present

## 2015-08-27 DIAGNOSIS — N182 Chronic kidney disease, stage 2 (mild): Secondary | ICD-10-CM | POA: Diagnosis not present

## 2015-08-27 DIAGNOSIS — E1165 Type 2 diabetes mellitus with hyperglycemia: Secondary | ICD-10-CM | POA: Diagnosis not present

## 2015-08-27 DIAGNOSIS — I2510 Atherosclerotic heart disease of native coronary artery without angina pectoris: Secondary | ICD-10-CM | POA: Diagnosis not present

## 2015-08-27 DIAGNOSIS — Z7982 Long term (current) use of aspirin: Secondary | ICD-10-CM | POA: Diagnosis not present

## 2015-08-27 DIAGNOSIS — R2681 Unsteadiness on feet: Secondary | ICD-10-CM | POA: Diagnosis not present

## 2015-08-27 DIAGNOSIS — K7460 Unspecified cirrhosis of liver: Secondary | ICD-10-CM | POA: Diagnosis not present

## 2015-08-27 DIAGNOSIS — Z794 Long term (current) use of insulin: Secondary | ICD-10-CM | POA: Diagnosis not present

## 2015-08-27 DIAGNOSIS — Z955 Presence of coronary angioplasty implant and graft: Secondary | ICD-10-CM | POA: Diagnosis not present

## 2015-08-27 DIAGNOSIS — M6281 Muscle weakness (generalized): Secondary | ICD-10-CM | POA: Diagnosis not present

## 2015-08-27 DIAGNOSIS — I129 Hypertensive chronic kidney disease with stage 1 through stage 4 chronic kidney disease, or unspecified chronic kidney disease: Secondary | ICD-10-CM | POA: Diagnosis not present

## 2015-08-27 DIAGNOSIS — Z8673 Personal history of transient ischemic attack (TIA), and cerebral infarction without residual deficits: Secondary | ICD-10-CM | POA: Diagnosis not present

## 2015-08-27 NOTE — Telephone Encounter (Signed)
Jay [name] from [facility] home health called requesting verbal orders for OT for once a week for 3 weeks.    CB# [phone]

## 2015-09-02 NOTE — Telephone Encounter (Signed)
ok 

## 2015-09-03 ENCOUNTER — Telehealth: Payer: Self-pay | Admitting: Family Medicine

## 2015-09-03 DIAGNOSIS — E1122 Type 2 diabetes mellitus with diabetic chronic kidney disease: Secondary | ICD-10-CM | POA: Diagnosis not present

## 2015-09-03 DIAGNOSIS — I2510 Atherosclerotic heart disease of native coronary artery without angina pectoris: Secondary | ICD-10-CM | POA: Diagnosis not present

## 2015-09-03 DIAGNOSIS — Z955 Presence of coronary angioplasty implant and graft: Secondary | ICD-10-CM | POA: Diagnosis not present

## 2015-09-03 DIAGNOSIS — Z794 Long term (current) use of insulin: Secondary | ICD-10-CM | POA: Diagnosis not present

## 2015-09-03 DIAGNOSIS — M6281 Muscle weakness (generalized): Secondary | ICD-10-CM | POA: Diagnosis not present

## 2015-09-03 DIAGNOSIS — Z8673 Personal history of transient ischemic attack (TIA), and cerebral infarction without residual deficits: Secondary | ICD-10-CM | POA: Diagnosis not present

## 2015-09-03 DIAGNOSIS — R2681 Unsteadiness on feet: Secondary | ICD-10-CM | POA: Diagnosis not present

## 2015-09-03 DIAGNOSIS — I129 Hypertensive chronic kidney disease with stage 1 through stage 4 chronic kidney disease, or unspecified chronic kidney disease: Secondary | ICD-10-CM | POA: Diagnosis not present

## 2015-09-03 DIAGNOSIS — Z7982 Long term (current) use of aspirin: Secondary | ICD-10-CM | POA: Diagnosis not present

## 2015-09-03 DIAGNOSIS — E1165 Type 2 diabetes mellitus with hyperglycemia: Secondary | ICD-10-CM | POA: Diagnosis not present

## 2015-09-03 DIAGNOSIS — K7460 Unspecified cirrhosis of liver: Secondary | ICD-10-CM | POA: Diagnosis not present

## 2015-09-03 DIAGNOSIS — N182 Chronic kidney disease, stage 2 (mild): Secondary | ICD-10-CM | POA: Diagnosis not present

## 2015-09-03 NOTE — Telephone Encounter (Signed)
Arline Asp wilth [facility] is asking if pt should be taking the Rx metoprolol tartrate (LOPRESSOR) 25 MG tablet.  She states this is listed on his medication list but pt does not have this medication in the home.  BB#[phone]

## 2015-09-03 NOTE — Telephone Encounter (Signed)
Please review-aa 

## 2015-09-04 ENCOUNTER — Encounter: Payer: Self-pay | Admitting: *Deleted

## 2015-09-04 ENCOUNTER — Other Ambulatory Visit: Payer: Self-pay | Admitting: *Deleted

## 2015-09-04 NOTE — Patient Outreach (Addendum)
[facility]) Care Management  09/04/2015  Sipriano Cancel [facility] September 25, 1945 [id number]   No response from patient outreach attempts, will proceed with case closure.   Objective: Per chart review: Patient hospitalized 08/05/15 - 08/08/15 with seizure. Hospitalized 07/22/15 - 07/27/15 with possible stroke and acute encephalopathy. Patient also has a history of : diabetes, hyperlipidemia, hypertension, and CAD. Patient receiving home health physical therapy through [facility]. [facility] requested home health RN for medication management.   Assessment: Received [facility] Referral on 08/12/15. Referral source: Kathrine Haddock. Referral reason: Disease and symptom management. Trigger 08/09/15, high risk for readmission, observation admit for seizure, had a recent inpatient and skilled nursing facility stay. Services requested: [facility] Care Management Community RNCM. No response from patient outreach attempts, will proceed with case closure.    Plan: RNCM will send patient's primary MD case closure letter due unable to contact. RNCM will send case closure due to unable to contact request to [name] at [facility] Care Management.     Daltin Crist H. [name], BSN, CCM [facility] Care Management [facility] Telephonic CM Phone: [phone] Fax: [phone]

## 2015-09-04 NOTE — Telephone Encounter (Signed)
Yes,if he has been taking it.

## 2015-09-05 DIAGNOSIS — R200 Anesthesia of skin: Secondary | ICD-10-CM | POA: Diagnosis not present

## 2015-09-05 DIAGNOSIS — R569 Unspecified convulsions: Secondary | ICD-10-CM | POA: Diagnosis not present

## 2015-09-05 DIAGNOSIS — R202 Paresthesia of skin: Secondary | ICD-10-CM | POA: Diagnosis not present

## 2015-09-05 DIAGNOSIS — M79609 Pain in unspecified limb: Secondary | ICD-10-CM | POA: Diagnosis not present

## 2015-09-05 NOTE — Telephone Encounter (Signed)
Jay [name] is calling back and request a call back to discuss medication.  [id number]#[phone]

## 2015-09-08 ENCOUNTER — Other Ambulatory Visit: Payer: Self-pay | Admitting: Family Medicine

## 2015-10-08 ENCOUNTER — Ambulatory Visit (INDEPENDENT_AMBULATORY_CARE_PROVIDER_SITE_OTHER): Payer: PPO | Admitting: Family Medicine

## 2015-10-08 ENCOUNTER — Other Ambulatory Visit: Payer: Self-pay | Admitting: Family Medicine

## 2015-10-08 VITALS — BP 104/62 | HR 110 | Temp 97.4°F | Resp 18 | Wt 264.0 lb

## 2015-10-08 DIAGNOSIS — R4189 Other symptoms and signs involving cognitive functions and awareness: Secondary | ICD-10-CM | POA: Diagnosis not present

## 2015-10-08 DIAGNOSIS — R4689 Other symptoms and signs involving appearance and behavior: Secondary | ICD-10-CM

## 2015-10-08 DIAGNOSIS — R600 Localized edema: Secondary | ICD-10-CM | POA: Diagnosis not present

## 2015-10-08 DIAGNOSIS — R410 Disorientation, unspecified: Secondary | ICD-10-CM

## 2015-10-08 DIAGNOSIS — E139 Other specified diabetes mellitus without complications: Secondary | ICD-10-CM

## 2015-10-08 DIAGNOSIS — F919 Conduct disorder, unspecified: Secondary | ICD-10-CM | POA: Diagnosis not present

## 2015-10-08 LAB — POCT URINALYSIS DIPSTICK
BILIRUBIN UA: NEGATIVE
KETONES UA: NEGATIVE
LEUKOCYTES UA: NEGATIVE
NITRITE UA: NEGATIVE
PH UA: 6
PROTEIN UA: NEGATIVE
RBC UA: NEGATIVE
Spec Grav, UA: 1.01
Urobilinogen, UA: NEGATIVE

## 2015-10-08 LAB — GLUCOSE, POCT (MANUAL RESULT ENTRY): POC GLUCOSE: 409 mg/dL — AB (ref 70–99)

## 2015-10-08 LAB — CBG MONITORING, ED

## 2015-10-08 MED ORDER — DONEPEZIL HCL 5 MG PO TABS: 5.0000 mg | ORAL_TABLET | Freq: Every day | ORAL | 12 refills | Status: AC

## 2015-10-08 NOTE — Progress Notes (Signed)
Jay [name]  MRN: [id number] DOB: 1945-05-02  Subjective:  HPI   The patient is a 70 year old male who presented to the office without an appointment but stating that he thinks he has an appointment somewhere.  He complains that he has been feeling weak and short of breath.  He thinks he has an appointment with Dr. Malvin Johns today.  He has repeated several times that he was in the hospital about a month ago after having a stroke and he thinks that is why he is to see Dr. Malvin Johns.  Our office has called Dr. Malvin Johns and they do not have him down for an appointment at all.  The patient does not appear to be short of breath but he is tremulous, sluggish and somewhat disoriented.  He states he can't remember what medications he is on.  He thinks his glucose this morning was in the 300's He states he fell while outside yesterday and thinks it was because his knee went out.  He said he was on the ground for about 15 minutes before his son saw him and was able to help.  He does not have any injuries from that fall that he is aware.    He denies any pain and says he is just weak, has a tremor and is unsure of a lot of the things I have asked him about today. He states he did drive himself here today.  He thought that his appoimmtent with Dr Malvin Johns was in this building somewhere and when he couldn't find it he decided he would come see [place].  Patient Active Problem List   Diagnosis Date Noted  . Encephalopathy, hepatic (HCC) 07/26/2015  . Hyperammonemia (HCC) 07/25/2015  . Type 2 diabetes mellitus with hyperglycemia (HCC) 07/25/2015  . Seizures (HCC)   . Hyperglycemia   . Uncontrolled type 2 diabetes mellitus with ketoacidotic coma, with long-term current use of insulin (HCC)   . Stroke (HCC) 07/22/2015  . Respiratory failure (HCC)   . Acute encephalopathy   . Unstable angina (HCC) 06/24/2015  . Diabetes mellitus (HCC) 08/20/2014  . Wound infection (HCC) 08/17/2014  . Acute kidney failure (HCC)  05/23/2014  . Actinic keratosis 05/23/2014  . Absolute anemia 05/23/2014  . CAD in native artery 05/23/2014  . Depression, major, recurrent (HCC) 05/23/2014  . DM (diabetes mellitus), secondary (HCC) 05/23/2014  . Diabetic retinopathy (HCC) 05/23/2014  . Abnormal LFTs 05/23/2014  . Fatty infiltration of liver 05/23/2014  . Acid reflux 05/23/2014  . BP (high blood pressure) 05/23/2014  . HLD (hyperlipidemia) 05/23/2014  . Anemia, iron deficiency 05/23/2014  . Metal bone fixation hardware in place 05/23/2014  . Adiposity 05/23/2014  . Obstructive apnea 05/23/2014  . Plaque psoriasis 05/23/2014  . 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 AF (paroxysmal atrial fibrillation) (HCC) 03/01/2014  . Aortic heart valve narrowing 01/05/2014  . Chronic kidney disease (CKD), stage IV (severe) (HCC) 01/05/2014  . Benign essential HTN 01/05/2014    Past Medical History:  Diagnosis Date  . Anemia   . Angina pectoris (HCC)   . Bilateral diabetic retinopathy (HCC)   . CAD (coronary artery disease)   . Diabetes mellitus without complication (HCC)   . Fatty liver   . Foot deformities, congenital   . GERD (gastroesophageal reflux disease)   . Hypertension   . Morbid obesity (HCC)   . Pedal edema   . Sinoatrial node dysfunction (HCC)   . Sleep apnea    not tolerating CPAP  Social History   Social History  . Marital status: Married    Spouse name: N/A  . Number of children: N/A  . Years of education: N/A   Occupational History  . Not on file.   Social History Main Topics  . Smoking status: Never Smoker  . Smokeless tobacco: Never Used  . Alcohol use No  . Drug use: No  . Sexual activity: No   Other Topics Concern  . Not on file   Social History Narrative  . No narrative on file    Outpatient Encounter Prescriptions as of 10/08/2015  Medication Sig  . amLODipine (NORVASC) 5 MG tablet Take 1 tablet (5 mg total) by mouth daily.  Marland Kitchen. aspirin 81 MG tablet Take 1 tablet by mouth  daily.  . ferrous sulfate 325 (65 FE) MG tablet Take 1 tablet (325 mg total) by mouth 2 (two) times daily.  . insulin aspart (NOVOLOG) 100 UNIT/ML injection Up to 10 units with each meal  . insulin detemir (LEVEMIR) 100 UNIT/ML injection Inject 0.35 mLs (35 Units total) into the skin 2 (two) times daily.  Marland Kitchen. lacosamide (VIMPAT) 50 MG TABS tablet Take 1 tablet (50 mg total) by mouth 2 (two) times daily.  Marland Kitchen. lactulose (CHRONULAC) 10 GM/15ML solution Take 30 mLs (20 g total) by mouth 2 (two) times daily.  Marland Kitchen. loratadine (CLARITIN) 10 MG tablet Take 1 tablet (10 mg total) by mouth daily.  . meclizine (ANTIVERT) 25 MG tablet TAKE 1 TABLET BY MOUTH 3 TIMES DAILY AS NEEDED FOR DIZZINESS  . metoprolol tartrate (LOPRESSOR) 25 MG tablet Take 0.5 tablets (12.5 mg total) by mouth 2 (two) times daily.  . mometasone (ELOCON) 0.1 % cream Apply 1 application topically daily. For 2 weeks  . pantoprazole (PROTONIX) 40 MG tablet TAKE 1 TABLET BY MOUTH TWICE DAILY.  Marland Kitchen. promethazine (PHENERGAN) 25 MG tablet Take 1 tablet (25 mg total) by mouth every 4 (four) hours as needed for nausea or vomiting.  . rosuvastatin (CRESTOR) 10 MG tablet TAKE 1 TABLET BY MOUTH AT BEDTIME.  . tamsulosin (FLOMAX) 0.4 MG CAPS capsule Take 1 capsule (0.4 mg total) by mouth daily.   No facility-administered encounter medications on file as of 10/08/2015.     Medications were not verified with the patient as he is unable to confirm what he takes.  He is unable to remember a lot of things today.  He does state that he thinks he took his insulin today but said it was not the amount he was supposed to take.  He said he was unable to get one of his insulins so he is taking a little bit more ot the one he does have.  When asked which insulin he took he kept saying his Glyburide.  He did not understand that this was not his insulin.  **  Random Fingerstick blood glucose today is 409 mg/dl.   Allergies  Allergen Reactions  . Metformin Nausea Only     Review of Systems  Constitutional: Positive for malaise/fatigue. Negative for chills and fever.  Eyes: Negative.   Respiratory: Negative for cough, shortness of breath and wheezing.   Cardiovascular: Positive for chest pain and leg swelling. Negative for palpitations and orthopnea.  Gastrointestinal: Negative.   Musculoskeletal: Positive for falls (Patient had a fall yesterday out in his yard with no injuries reported by the patient./).  Neurological: Positive for dizziness, tremors and weakness. Negative for headaches.       Numbness in his right hand Tremor  worse on the left  Psychiatric/Behavioral: Positive for memory loss.   Objective:  BP 104/62   Pulse (!) 110   Temp 97.4 F (36.3 C) (Oral)   Resp 18   Wt 264 lb (119.7 kg)   SpO2 96%   BMI 38.99 kg/m   Physical Exam  Constitutional: He is well-developed, well-nourished, and in no distress.  Morbidly obese white male in no acute distress. He is obviously confused today. He is cooperative and pleasant and is trying to react appropriately.  HENT:  Head: Normocephalic and atraumatic.  Right Ear: External ear normal.  Left Ear: External ear normal.  Nose: Nose normal.  Eyes: Conjunctivae are normal. Pupils are equal, round, and reactive to light. No scleral icterus.  Neck: Normal range of motion. Neck supple.  Cardiovascular: Regular rhythm and normal heart sounds.   Tachycardic  Pulmonary/Chest: Effort normal and breath sounds normal.  Abdominal: Soft. Bowel sounds are normal.  Musculoskeletal: He exhibits edema (1+ bilaterally) and tenderness.  Lymphadenopathy:    He has no cervical adenopathy.  Neurological: He is alert. No cranial nerve deficit. He exhibits normal muscle tone.  Skin: Skin is warm and dry.  Psychiatric: Mood and affect normal.    Assessment and Plan :  1. Confusion/Delirium Multifactorial. Likely due to out-of-control diabetes for years. Metabolic encephalopathy of uncertain etiology. I do not  think the patient drinks alcohol to excess - POCT Glucose (CBG) - POCT urinalysis dipstick - Urine culture - B Nat Peptide - Comprehensive metabolic panel - CBC with Differential/Platelet - TSH - Ambulatory referral to Neurology  2. DM (diabetes mellitus), secondary (HCC) Very poor control this patient is completely noncompliant with any diet or exercise. He has difficulty financially affording his medications. His son is here today and we'll try to help him with compliance. The patient now lives with a son due to financial issues. - POCT Glucose (CBG)  3. Bilateral edema of lower extremity  - B Nat Peptide - Comprehensive metabolic panel - CBC with Differential/Platelet - TSH  4. Cognitive and behavioral changes  - donepezil (ARICEPT) 5 MG tablet; Take 1 tablet (5 mg total) by mouth at bedtime.  Dispense: 30 tablet; Refill: 12 5. CAD All risk factors treated 6. Noncompliance with diet and exercise changes required for diabetes control 6. Morbid obesity

## 2015-10-08 NOTE — Patient Instructions (Signed)
Patient is to have labs done when he leaves the office today.  Prescription for Donepezil has been sent to the pharmacy.  Son will call tomorrow with his list of medications and dosing of his insulin.  Referral is going to be made for him to see neurology.  Urine culture has been sent tot the lab.  Check his fasting sugar daily until stable.  Patient is to discontinue driving today.  He is to give his son the keys.  Follow up in one month

## 2015-10-09 ENCOUNTER — Telehealth: Payer: Self-pay

## 2015-10-09 LAB — COMPREHENSIVE METABOLIC PANEL
A/G RATIO: 1.2 (ref 1.2–2.2)
ALT: 52 IU/L — ABNORMAL HIGH (ref 0–44)
AST: 43 IU/L — AB (ref 0–40)
Albumin: 3.7 g/dL (ref 3.6–4.8)
Alkaline Phosphatase: 186 IU/L — ABNORMAL HIGH (ref 39–117)
BILIRUBIN TOTAL: 0.7 mg/dL (ref 0.0–1.2)
BUN/Creatinine Ratio: 19 (ref 10–24)
BUN: 21 mg/dL (ref 8–27)
CALCIUM: 9.2 mg/dL (ref 8.6–10.2)
CHLORIDE: 93 mmol/L — AB (ref 96–106)
CO2: 26 mmol/L (ref 18–29)
Creatinine, Ser: 1.13 mg/dL (ref 0.76–1.27)
GFR, EST AFRICAN AMERICAN: 77 mL/min/{1.73_m2} (ref >59)
GFR, EST NON AFRICAN AMERICAN: 66 mL/min/{1.73_m2} (ref >59)
GLOBULIN, TOTAL: 3 g/dL (ref 1.5–4.5)
Glucose: 361 mg/dL — ABNORMAL HIGH (ref 65–99)
POTASSIUM: 4.6 mmol/L (ref 3.5–5.2)
SODIUM: 135 mmol/L (ref 134–144)
TOTAL PROTEIN: 6.7 g/dL (ref 6.0–8.5)

## 2015-10-09 LAB — CBC WITH DIFFERENTIAL/PLATELET
BASOS: 0 %
Basophils Absolute: 0 10*3/uL (ref 0.0–0.2)
EOS (ABSOLUTE): 0.1 10*3/uL (ref 0.0–0.4)
Eos: 2 %
Hematocrit: 43.8 % (ref 37.5–51.0)
Hemoglobin: 14.4 g/dL (ref 12.6–17.7)
Immature Grans (Abs): 0 10*3/uL (ref 0.0–0.1)
Immature Granulocytes: 0 %
Lymphocytes Absolute: 1.6 10*3/uL (ref 0.7–3.1)
Lymphs: 23 %
MCH: 30.7 pg (ref 26.6–33.0)
MCHC: 32.9 g/dL (ref 31.5–35.7)
MCV: 93 fL (ref 79–97)
MONOS ABS: 1 10*3/uL — AB (ref 0.1–0.9)
Monocytes: 13 %
NEUTROS ABS: 4.4 10*3/uL (ref 1.4–7.0)
Neutrophils: 62 %
PLATELETS: 206 10*3/uL (ref 150–379)
RBC: 4.69 x10E6/uL (ref 4.14–5.80)
RDW: 15.1 % (ref 12.3–15.4)
WBC: 7.1 10*3/uL (ref 3.4–10.8)

## 2015-10-09 LAB — BRAIN NATRIURETIC PEPTIDE: BNP: 25 pg/mL (ref 0.0–100.0)

## 2015-10-09 LAB — TSH: TSH: 0.726 u[IU]/mL (ref 0.450–4.500)

## 2015-10-09 NOTE — Telephone Encounter (Signed)
-----   Message from Maple Hudson., MD sent at 10/09/2015 11:21 AM EDT ----- Labs stable.

## 2015-10-09 NOTE — Telephone Encounter (Signed)
Advised pt of lab results. Pt verbally acknowledges understanding. Emily Drozdowski, CMA   

## 2015-10-10 LAB — URINE CULTURE

## 2015-10-10 LAB — PLEASE NOTE

## 2015-10-11 ENCOUNTER — Other Ambulatory Visit: Payer: Self-pay | Admitting: Family Medicine

## 2015-10-11 DIAGNOSIS — K219 Gastro-esophageal reflux disease without esophagitis: Secondary | ICD-10-CM

## 2015-10-12 ENCOUNTER — Telehealth: Payer: Self-pay

## 2015-10-12 DIAGNOSIS — N390 Urinary tract infection, site not specified: Secondary | ICD-10-CM

## 2015-10-12 MED ORDER — AMPICILLIN 500 MG PO CAPS: 500.0000 mg | ORAL_CAPSULE | Freq: Two times a day (BID) | ORAL | 0 refills | Status: DC

## 2015-10-12 NOTE — Telephone Encounter (Signed)
Patient advised as directed below. Ampicillin 500 MG BID for one week called to [facility].

## 2015-10-12 NOTE — Telephone Encounter (Signed)
-----   Message from Maple Hudson., MD sent at 10/12/2015  8:14 AM EDT ----- UTI apparently present.: Ampicillin 500 mg twice a day for one week.

## 2015-10-31 ENCOUNTER — Emergency Department
Admission: EM | Admit: 2015-10-31 | Discharge: 2015-10-31 | Disposition: A | Discharge status: Home / Self Care | Payer: PPO | Attending: Emergency Medicine | Admitting: Emergency Medicine

## 2015-10-31 ENCOUNTER — Encounter: Payer: Self-pay | Admitting: *Deleted

## 2015-10-31 DIAGNOSIS — I129 Hypertensive chronic kidney disease with stage 1 through stage 4 chronic kidney disease, or unspecified chronic kidney disease: Secondary | ICD-10-CM | POA: Insufficient documentation

## 2015-10-31 DIAGNOSIS — Z8673 Personal history of transient ischemic attack (TIA), and cerebral infarction without residual deficits: Secondary | ICD-10-CM | POA: Diagnosis not present

## 2015-10-31 DIAGNOSIS — E1122 Type 2 diabetes mellitus with diabetic chronic kidney disease: Secondary | ICD-10-CM | POA: Diagnosis not present

## 2015-10-31 DIAGNOSIS — N184 Chronic kidney disease, stage 4 (severe): Secondary | ICD-10-CM | POA: Insufficient documentation

## 2015-10-31 DIAGNOSIS — R5381 Other malaise: Secondary | ICD-10-CM

## 2015-10-31 DIAGNOSIS — Z79899 Other long term (current) drug therapy: Secondary | ICD-10-CM | POA: Diagnosis not present

## 2015-10-31 DIAGNOSIS — I2510 Atherosclerotic heart disease of native coronary artery without angina pectoris: Secondary | ICD-10-CM | POA: Insufficient documentation

## 2015-10-31 DIAGNOSIS — E1165 Type 2 diabetes mellitus with hyperglycemia: Secondary | ICD-10-CM | POA: Diagnosis not present

## 2015-10-31 DIAGNOSIS — Z7982 Long term (current) use of aspirin: Secondary | ICD-10-CM | POA: Insufficient documentation

## 2015-10-31 DIAGNOSIS — Z794 Long term (current) use of insulin: Secondary | ICD-10-CM | POA: Insufficient documentation

## 2015-10-31 DIAGNOSIS — R739 Hyperglycemia, unspecified: Secondary | ICD-10-CM

## 2015-10-31 LAB — COMPREHENSIVE METABOLIC PANEL
ALBUMIN: 3.5 g/dL (ref 3.5–5.0)
ALT: 58 U/L (ref 17–63)
AST: 51 U/L — AB (ref 15–41)
Alkaline Phosphatase: 146 U/L — ABNORMAL HIGH (ref 38–126)
Anion gap: 8 (ref 5–15)
BUN: 16 mg/dL (ref 6–20)
CHLORIDE: 96 mmol/L — AB (ref 101–111)
CO2: 26 mmol/L (ref 22–32)
Calcium: 8.8 mg/dL — ABNORMAL LOW (ref 8.9–10.3)
Creatinine, Ser: 0.81 mg/dL (ref 0.61–1.24)
GFR calc Af Amer: >60 mL/min (ref >60)
GFR calc non Af Amer: >60 mL/min (ref >60)
GLUCOSE: 307 mg/dL — AB (ref 65–99)
POTASSIUM: 4 mmol/L (ref 3.5–5.1)
SODIUM: 130 mmol/L — AB (ref 135–145)
Total Bilirubin: 1.1 mg/dL (ref 0.3–1.2)
Total Protein: 7.3 g/dL (ref 6.5–8.1)

## 2015-10-31 LAB — CBC
HEMATOCRIT: 45.4 % (ref 40.0–52.0)
HEMOGLOBIN: 15.6 g/dL (ref 13.0–18.0)
MCH: 30.6 pg (ref 26.0–34.0)
MCHC: 34.3 g/dL (ref 32.0–36.0)
MCV: 89.1 fL (ref 80.0–100.0)
Platelets: 190 10*3/uL (ref 150–440)
RBC: 5.1 MIL/uL (ref 4.40–5.90)
RDW: 14.7 % — ABNORMAL HIGH (ref 11.5–14.5)
WBC: 10.1 10*3/uL (ref 3.8–10.6)

## 2015-10-31 LAB — GLUCOSE, CAPILLARY: Glucose-Capillary: 292 mg/dL — ABNORMAL HIGH (ref 65–99)

## 2015-10-31 LAB — LIPASE, BLOOD: Lipase: 23 U/L (ref 11–51)

## 2015-10-31 MED ORDER — ONDANSETRON HCL 4 MG/2ML IJ SOLN
4.0000 mg | Freq: Once | INTRAMUSCULAR | Status: AC
  Administered 2015-10-31: 4 mg via INTRAVENOUS
  Filled 2015-10-31: qty 2

## 2015-10-31 MED ORDER — ONDANSETRON 8 MG PO TBDP: 8.0000 mg | ORAL_TABLET | Freq: Three times a day (TID) | ORAL | 0 refills | Status: AC | PRN

## 2015-10-31 MED ORDER — SODIUM CHLORIDE 0.9 % IV BOLUS (SEPSIS)
1000.0000 mL | Freq: Once | INTRAVENOUS | Status: AC
  Administered 2015-10-31: 1000 mL via INTRAVENOUS

## 2015-10-31 NOTE — ED Notes (Signed)
Pt CBG rechecked prior to discharge  292 , Dr Scotty Court aware, pt instructed to follow up with PCP , drink plenty of fluid .

## 2015-10-31 NOTE — ED Triage Notes (Signed)
Pt reports "not feeling well" since Thursday, pt reports blood sugar this morning was 300, pt has and a few episodes of nausea/vomiting,

## 2015-10-31 NOTE — ED Provider Notes (Signed)
[facility] Emergency Department Provider Note  ____________________________________________  Time seen: Approximately 7:52 AM  I have reviewed the triage vital signs and the nursing notes.   HISTORY  Chief Complaint Hyperglycemia  Level 5 caveat:  Portions of the history and physical were unable to be obtained due to the patient's poor historian   HPI Jay [name] is a 70 y.o. male who complains of not feeling well for the past week. Gradual onset. Unable to relate any specific symptoms. No chest pain shortness of breath abdominal pain vomiting diarrhea. He does endorse some nausea. Says his appetite is "fair" and that he is drinking lots of water. His twin sister is at the bedside with him and offers that she thinks he is depressed. He denies any SI HI or hallucinations. He recently suffered a stroke 2 months ago, and his sister describes his residual deficit as memory loss. He remains ambulatory     Past Medical History:  Diagnosis Date  . Anemia   . Angina pectoris (HCC)   . Bilateral diabetic retinopathy (HCC)   . CAD (coronary artery disease)   . Diabetes mellitus without complication (HCC)   . Fatty liver   . Foot deformities, congenital   . GERD (gastroesophageal reflux disease)   . Hypertension   . Morbid obesity (HCC)   . Pedal edema   . Sinoatrial node dysfunction (HCC)   . Sleep apnea    not tolerating CPAP     Patient Active Problem List   Diagnosis Date Noted  . Encephalopathy, hepatic (HCC) 07/26/2015  . Hyperammonemia (HCC) 07/25/2015  . Type 2 diabetes mellitus with hyperglycemia (HCC) 07/25/2015  . Seizures (HCC)   . Hyperglycemia   . Uncontrolled type 2 diabetes mellitus with ketoacidotic coma, with long-term current use of insulin (HCC)   . Stroke (HCC) 07/22/2015  . Respiratory failure (HCC)   . Acute encephalopathy   . Unstable angina (HCC) 06/24/2015  . Diabetes mellitus (HCC) 08/20/2014  . Wound infection (HCC)  08/17/2014  . Acute kidney failure (HCC) 05/23/2014  . Actinic keratosis 05/23/2014  . Absolute anemia 05/23/2014  . CAD in native artery 05/23/2014  . Depression, major, recurrent (HCC) 05/23/2014  . DM (diabetes mellitus), secondary (HCC) 05/23/2014  . Diabetic retinopathy (HCC) 05/23/2014  . Abnormal LFTs 05/23/2014  . Fatty infiltration of liver 05/23/2014  . Acid reflux 05/23/2014  . BP (high blood pressure) 05/23/2014  . HLD (hyperlipidemia) 05/23/2014  . Anemia, iron deficiency 05/23/2014  . Metal bone fixation hardware in place 05/23/2014  . Adiposity 05/23/2014  . Obstructive apnea 05/23/2014  . Plaque psoriasis 05/23/2014  . 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 AF (paroxysmal atrial fibrillation) (HCC) 03/01/2014  . Aortic heart valve narrowing 01/05/2014  . Chronic kidney disease (CKD), stage IV (severe) (HCC) 01/05/2014  . Benign essential HTN 01/05/2014     Past Surgical History:  Procedure Laterality Date  . CARDIAC CATHETERIZATION Left 07/10/2015   Procedure: Coronary Angiography;  Surgeon: Lamar BlinksBruce J Kowalski, MD;  Location: [facility] INVASIVE CV LAB;  Service: Cardiovascular;  Laterality: Left;  . CORONARY ANGIOPLASTY WITH STENT PLACEMENT  2006 and 2008     Prior to Admission medications   Medication Sig Start Date End Date Taking? Authorizing Provider  amLODipine (NORVASC) 5 MG tablet Take 1 tablet (5 mg total) by mouth daily. 08/08/15   Enid Baasadhika Kalisetti, MD  ampicillin (PRINCIPEN) 500 MG capsule Take 1 capsule (500 mg total) by mouth 2 (two) times daily. 10/12/15  Cordae Hulen Shouts., MD  aspirin 81 MG tablet Take 1 tablet by mouth daily. 12/01/10   Historical Provider, MD  donepezil (ARICEPT) 5 MG tablet Take 1 tablet (5 mg total) by mouth at bedtime. 10/08/15   Izac Hulen Shouts., MD  ferrous sulfate 325 (65 FE) MG tablet Take 1 tablet (325 mg total) by mouth 2 (two) times daily. 05/24/15   Kline Hulen Shouts., MD  insulin aspart (NOVOLOG) 100 UNIT/ML injection Up  to 10 units with each meal 08/19/15   Pookela Hulen Shouts., MD  insulin detemir (LEVEMIR) 100 UNIT/ML injection Inject 0.35 mLs (35 Units total) into the skin 2 (two) times daily. 08/19/15   Tyrin Hulen Shouts., MD  lacosamide (VIMPAT) 50 MG TABS tablet Take 1 tablet (50 mg total) by mouth 2 (two) times daily. 08/08/15   Enid Baas, MD  lactulose (CHRONULAC) 10 GM/15ML solution Take 30 mLs (20 g total) by mouth 2 (two) times daily. 08/08/15   Enid Baas, MD  loratadine (CLARITIN) 10 MG tablet Take 1 tablet (10 mg total) by mouth daily. 05/30/15   Skyler Hulen Shouts., MD  meclizine (ANTIVERT) 25 MG tablet TAKE 1 TABLET BY MOUTH 3 TIMES DAILY AS NEEDED FOR DIZZINESS 08/08/15   Enid Baas, MD  metoprolol tartrate (LOPRESSOR) 25 MG tablet Take 0.5 tablets (12.5 mg total) by mouth 2 (two) times daily. 07/27/15   Catarina Hartshorn, MD  mometasone (ELOCON) 0.1 % cream Apply 1 application topically daily. For 2 weeks 03/26/14   Historical Provider, MD  ondansetron (ZOFRAN ODT) 8 MG disintegrating tablet Take 1 tablet (8 mg total) by mouth every 8 (eight) hours as needed for nausea or vomiting. 10/31/15   Sharman Cheek, MD  pantoprazole (PROTONIX) 40 MG tablet TAKE 1 TABLET BY MOUTH TWICE DAILY. 10/11/15   Reiley Hulen Shouts., MD  promethazine (PHENERGAN) 25 MG tablet Take 1 tablet (25 mg total) by mouth every 4 (four) hours as needed for nausea or vomiting. 07/16/15   Maple Hudson., MD  rosuvastatin (CRESTOR) 10 MG tablet TAKE 1 TABLET BY MOUTH AT BEDTIME 10/11/15   Tahjir Hulen Shouts., MD  tamsulosin (FLOMAX) 0.4 MG CAPS capsule Take 1 capsule (0.4 mg total) by mouth daily. 05/23/15   Daundre Hulen Shouts., MD     Allergies Metformin   Family History  Problem Relation Age of Onset  . Leukemia Mother   . Cancer Father   . Deep vein thrombosis Father   . Psychiatric Illness Brother   . Heart disease Brother     Social History Social History  Substance Use Topics  . Smoking  status: Never Smoker  . Smokeless tobacco: Never Used  . Alcohol use No    Review of Systems  Constitutional:   No fever or chills. Positive malaise ENT:   No sore throat. No rhinorrhea. Cardiovascular:   No chest pain. Respiratory:   No dyspnea or cough. Gastrointestinal:   Negative for abdominal pain, vomiting and diarrhea.  Genitourinary:   Negative for dysuria or difficulty urinating. Musculoskeletal:   Negative for focal pain or swelling Neurological:   Negative for headaches, weakness, paresthesia 10-point ROS otherwise negative.  ____________________________________________   PHYSICAL EXAM:  VITAL SIGNS: ED Triage Vitals  Enc Vitals Group     BP 10/31/15 0715 (!) 129/95     Pulse Rate 10/31/15 0715 90     Resp 10/31/15 0715 20     Temp 10/31/15 0715 97.6 F (36.4  C)     Temp Source 10/31/15 0715 Oral     SpO2 10/31/15 0715 99 %     Weight 10/31/15 0716 250 lb (113.4 kg)     Height 10/31/15 0716 5\' 9"  (1.753 m)     Head Circumference --      Peak Flow --      Pain Score --      Pain Loc --      Pain Edu? --      Excl. in GC? --     Vital signs reviewed, nursing assessments reviewed.   Constitutional:   Alert and oriented. Well appearing and in no distress. Eyes:   No scleral icterus. No conjunctival pallor. PERRL. EOMI.  No nystagmus. ENT   Head:   Normocephalic and atraumatic.   Nose:   No congestion/rhinnorhea. No septal hematoma   Mouth/Throat:   MMM, no pharyngeal erythema. No peritonsillar mass.    Neck:   No stridor. No SubQ emphysema. No meningismus. Hematological/Lymphatic/Immunilogical:   No cervical lymphadenopathy. Cardiovascular:   RRR. Symmetric bilateral radial and DP pulses.  No murmurs.  Respiratory:   Normal respiratory effort without tachypnea nor retractions. Breath sounds are clear and equal bilaterally. No wheezes/rales/rhonchi. Gastrointestinal:   Soft and nontender. Non distended. There is no CVA tenderness.  No rebound,  rigidity, or guarding. Genitourinary:   deferred Musculoskeletal:   Nontender with normal range of motion in all extremities. No joint effusions.  No lower extremity tenderness.  No edema. Neurologic:   Normal speech , restricted language.  Flat affect CN 2-10 normal. Motor grossly intact. Cerebellar testing normal No gross focal neurologic deficits are appreciated.  Skin:    Skin is warm, dry and intact. No rash noted.  No petechiae, purpura, or bullae.  ____________________________________________    LABS (pertinent positives/negatives) (all labs ordered are listed, but only abnormal results are displayed) Labs Reviewed  COMPREHENSIVE METABOLIC PANEL - Abnormal; Notable for the following:       Result Value   Sodium 130 (*)    Chloride 96 (*)    Glucose, Bld 307 (*)    Calcium 8.8 (*)    AST 51 (*)    Alkaline Phosphatase 146 (*)    All other components within normal limits  CBC - Abnormal; Notable for the following:    RDW 14.7 (*)    All other components within normal limits  LIPASE, BLOOD  URINALYSIS COMPLETEWITH MICROSCOPIC ([facility] ONLY)  CBG MONITORING, ED   ____________________________________________   EKG  Interpreted by me Sinus rhythm rate of 90, normal axis, normal intervals QRS and ST segments. Isolated T-wave inversion in lead 3 which is nonspecific  ____________________________________________    RADIOLOGY    ____________________________________________   PROCEDURES Procedures  ____________________________________________   INITIAL IMPRESSION / ASSESSMENT AND PLAN / ED COURSE  Pertinent labs & imaging results that were available during my care of the patient were reviewed by me and considered in my medical decision making (see chart for details).  Patient presents with vague constitutional symptoms, no localizing symptoms, exam is benign and nonfocal. He is well-appearing with unremarkable vital signs. EKG is nonischemic. Due to his morbid  obesity, diabetes, and poor history, we'll check labs as a screening measure and give IV fluids and Zofran for or some relief and comfort. Anticipate discharge home with outpatient follow-up.   Clinical Course  Labs unremarkable. Remains stable. Follow-up with Dr. Sullivan Lone tomorrow. ____________________________________________   FINAL CLINICAL IMPRESSION(S) / ED DIAGNOSES  Final diagnoses:  Hyperglycemia  Malaise       Portions of this note were generated with dragon dictation software. Dictation errors may occur despite best attempts at proofreading.    Sharman Cheek, MD 10/31/15 1001

## 2015-10-31 NOTE — ED Notes (Signed)
Pt repositioned for comfort

## 2015-10-31 NOTE — ED Notes (Signed)
MD at bedside. 

## 2015-11-01 ENCOUNTER — Other Ambulatory Visit: Payer: Self-pay

## 2015-11-01 NOTE — Patient Outreach (Signed)
Patient is eligible for [facility] services. Provided [facility] 24-hour nurse line for future needs.    Wynona Canes  [facility] Care Management Assistant

## 2015-11-01 NOTE — Patient Outreach (Signed)
Telephone outreach to patient to obtain mRS was successfully completed. mRS = 2  Nicole Jonaven Hilgers, B.A.  [facility] Care Management Assistant   

## 2015-11-05 ENCOUNTER — Ambulatory Visit (INDEPENDENT_AMBULATORY_CARE_PROVIDER_SITE_OTHER): Payer: PPO | Admitting: Family Medicine

## 2015-11-05 VITALS — BP 138/66 | HR 74 | Temp 98.1°F | Wt 269.0 lb

## 2015-11-05 DIAGNOSIS — Z794 Long term (current) use of insulin: Secondary | ICD-10-CM | POA: Diagnosis not present

## 2015-11-05 DIAGNOSIS — F32A Depression, unspecified: Secondary | ICD-10-CM

## 2015-11-05 DIAGNOSIS — E139 Other specified diabetes mellitus without complications: Secondary | ICD-10-CM

## 2015-11-05 DIAGNOSIS — R569 Unspecified convulsions: Secondary | ICD-10-CM

## 2015-11-05 DIAGNOSIS — I639 Cerebral infarction, unspecified: Secondary | ICD-10-CM

## 2015-11-05 DIAGNOSIS — E1165 Type 2 diabetes mellitus with hyperglycemia: Secondary | ICD-10-CM

## 2015-11-05 DIAGNOSIS — R410 Disorientation, unspecified: Secondary | ICD-10-CM

## 2015-11-05 DIAGNOSIS — F329 Major depressive disorder, single episode, unspecified: Secondary | ICD-10-CM

## 2015-11-05 DIAGNOSIS — R739 Hyperglycemia, unspecified: Secondary | ICD-10-CM | POA: Diagnosis not present

## 2015-11-05 DIAGNOSIS — G9340 Encephalopathy, unspecified: Secondary | ICD-10-CM

## 2015-11-05 DIAGNOSIS — K5900 Constipation, unspecified: Secondary | ICD-10-CM

## 2015-11-05 MED ORDER — LACTULOSE 10 GM/15ML PO SOLN: 20.0000 g | Freq: Two times a day (BID) | ORAL | 6 refills | Status: DC

## 2015-11-05 MED ORDER — SERTRALINE HCL 50 MG PO TABS: 50.0000 mg | ORAL_TABLET | Freq: Every day | ORAL | 3 refills | Status: DC

## 2015-11-05 NOTE — Progress Notes (Signed)
Subjective:  HPI  The patient is a 70 year old male who presents today for follow up.  He was seen in the ED on 10/31/15 with elevated glucose. When seen in the ED his glucose was in the 300's.   The patient continues to be confused.  He states that he had an appointment with his doctor over at the ED and that is why he presented there.  He is unable to answer most of my questions and repeatedly complains that he thinks his medicines are making him worse.  He was unable to tell me where his son lives despite the fact that he has been living there for at least 2 months.   The patient has had an increase in his weight of 19 pounds since being seen in the ED on 10/31/15. Patient did at one point admit to being very depressed.  He has not expressed being depressed in the past.  His wife is still not in the home and this appears to be stressful on the patient.  He has chronic progressive confusion/delirium  Prior to Admission medications   Medication Sig Start Date End Date Taking? Authorizing Provider  amLODipine (NORVASC) 5 MG tablet Take 1 tablet (5 mg total) by mouth daily. 08/08/15   Enid Baasadhika Kalisetti, MD  ampicillin (PRINCIPEN) 500 MG capsule Take 1 capsule (500 mg total) by mouth 2 (two) times daily. 10/12/15   Maple Hudsonichard L Gilbert Jr., MD  aspirin 81 MG tablet Take 1 tablet by mouth daily. 12/01/10   Historical Provider, MD  donepezil (ARICEPT) 5 MG tablet Take 1 tablet (5 mg total) by mouth at bedtime. 10/08/15   Johari Hulen ShoutsL Gilbert Jr., MD  ferrous sulfate 325 (65 FE) MG tablet Take 1 tablet (325 mg total) by mouth 2 (two) times daily. 05/24/15   Christina Hulen ShoutsL Gilbert Jr., MD  insulin aspart (NOVOLOG) 100 UNIT/ML injection Up to 10 units with each meal 08/19/15   Khyrie Hulen ShoutsL Gilbert Jr., MD  insulin detemir (LEVEMIR) 100 UNIT/ML injection Inject 0.35 mLs (35 Units total) into the skin 2 (two) times daily. 08/19/15   Arvil Hulen ShoutsL Gilbert Jr., MD  lacosamide (VIMPAT) 50 MG TABS tablet Take 1 tablet (50 mg total)  by mouth 2 (two) times daily. 08/08/15   Enid Baasadhika Kalisetti, MD  lactulose (CHRONULAC) 10 GM/15ML solution Take 30 mLs (20 g total) by mouth 2 (two) times daily. 08/08/15   Enid Baasadhika Kalisetti, MD  loratadine (CLARITIN) 10 MG tablet Take 1 tablet (10 mg total) by mouth daily. 05/30/15   Becky Hulen ShoutsL Gilbert Jr., MD  meclizine (ANTIVERT) 25 MG tablet TAKE 1 TABLET BY MOUTH 3 TIMES DAILY AS NEEDED FOR DIZZINESS 08/08/15   Enid Baasadhika Kalisetti, MD  metoprolol tartrate (LOPRESSOR) 25 MG tablet Take 0.5 tablets (12.5 mg total) by mouth 2 (two) times daily. 07/27/15   Catarina Hartshornavid Tat, MD  mometasone (ELOCON) 0.1 % cream Apply 1 application topically daily. For 2 weeks 03/26/14   Historical Provider, MD  ondansetron (ZOFRAN ODT) 8 MG disintegrating tablet Take 1 tablet (8 mg total) by mouth every 8 (eight) hours as needed for nausea or vomiting. 10/31/15   Sharman CheekPhillip Stafford, MD  pantoprazole (PROTONIX) 40 MG tablet TAKE 1 TABLET BY MOUTH TWICE DAILY. 10/11/15   Uvaldo Hulen ShoutsL Gilbert Jr., MD  promethazine (PHENERGAN) 25 MG tablet Take 1 tablet (25 mg total) by mouth every 4 (four) hours as needed for nausea or vomiting. 07/16/15   Maple Hudsonichard L Gilbert Jr., MD  rosuvastatin (CRESTOR) 10 MG  tablet TAKE 1 TABLET BY MOUTH AT BEDTIME 10/11/15   Gianlucca Hulen Shouts., MD  tamsulosin (FLOMAX) 0.4 MG CAPS capsule Take 1 capsule (0.4 mg total) by mouth daily. 05/23/15   Isiah Hulen Shouts., MD    Patient Active Problem List   Diagnosis Date Noted  . Encephalopathy, hepatic (HCC) 07/26/2015  . Hyperammonemia (HCC) 07/25/2015  . Type 2 diabetes mellitus with hyperglycemia (HCC) 07/25/2015  . Seizures (HCC)   . Hyperglycemia   . Uncontrolled type 2 diabetes mellitus with ketoacidotic coma, with long-term current use of insulin (HCC)   . Stroke (HCC) 07/22/2015  . Respiratory failure (HCC)   . Acute encephalopathy   . Unstable angina (HCC) 06/24/2015  . Diabetes mellitus (HCC) 08/20/2014  . Wound infection 08/17/2014  . Acute kidney failure (HCC)  05/23/2014  . Actinic keratosis 05/23/2014  . Absolute anemia 05/23/2014  . CAD in native artery 05/23/2014  . Depression, major, recurrent (HCC) 05/23/2014  . DM (diabetes mellitus), secondary (HCC) 05/23/2014  . Diabetic retinopathy (HCC) 05/23/2014  . Abnormal LFTs 05/23/2014  . Fatty infiltration of liver 05/23/2014  . Acid reflux 05/23/2014  . BP (high blood pressure) 05/23/2014  . HLD (hyperlipidemia) 05/23/2014  . Anemia, iron deficiency 05/23/2014  . Metal bone fixation hardware in place 05/23/2014  . Adiposity 05/23/2014  . Obstructive apnea 05/23/2014  . Plaque psoriasis 05/23/2014  . 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 AF (paroxysmal atrial fibrillation) (HCC) 03/01/2014  . Aortic heart valve narrowing 01/05/2014  . Chronic kidney disease (CKD), stage IV (severe) (HCC) 01/05/2014  . Benign essential HTN 01/05/2014    Past Medical History:  Diagnosis Date  . Anemia   . Angina pectoris (HCC)   . Bilateral diabetic retinopathy (HCC)   . CAD (coronary artery disease)   . Diabetes mellitus without complication (HCC)   . Fatty liver   . Foot deformities, congenital   . GERD (gastroesophageal reflux disease)   . Hypertension   . Morbid obesity (HCC)   . Pedal edema   . Sinoatrial node dysfunction (HCC)   . Sleep apnea    not tolerating CPAP    Social History   Social History  . Marital status: Married    Spouse name: N/A  . Number of children: N/A  . Years of education: N/A   Occupational History  . Not on file.   Social History Main Topics  . Smoking status: Never Smoker  . Smokeless tobacco: Never Used  . Alcohol use No  . Drug use: No  . Sexual activity: No   Other Topics Concern  . Not on file   Social History Narrative  . No narrative on file    Allergies  Allergen Reactions  . Metformin Nausea Only    Review of Systems  Constitutional: Negative for fever and malaise/fatigue.  HENT: Positive for congestion (head) and tinnitus (chronic,  unchanged). Negative for ear discharge, ear pain and sore throat (slight sore throat, possibly from his sinuses).   Eyes: Negative.   Respiratory: Negative for cough, shortness of breath and wheezing.   Cardiovascular: Negative for chest pain, palpitations, orthopnea, leg swelling and PND.  Gastrointestinal: Negative.   Skin: Negative.   Neurological: Positive for dizziness (Medicine is makeing it worse per the patient) and weakness. Negative for headaches.  Psychiatric/Behavioral: Positive for depression and memory loss.    Immunization History  Administered Date(s) Administered  . Td 11/26/2000   Objective:  BP 138/66   Pulse 74  Temp 98.1 F (36.7 C) (Oral)   Wt 269 lb (122 kg)   BMI 39.72 kg/m   Physical Exam  Constitutional: He is oriented to person, place, and time and well-developed, well-nourished, and in no distress.  HENT:  Head: Normocephalic and atraumatic.  Right Ear: External ear normal.  Left Ear: External ear normal.  Nose: Nose normal.  Mouth/Throat: Oropharynx is clear and moist.  Eyes: Conjunctivae are normal. Pupils are equal, round, and reactive to light.  Neck: Normal range of motion. Neck supple. No thyromegaly present.  Cardiovascular: Normal rate, regular rhythm and normal heart sounds.   Pulmonary/Chest: Effort normal and breath sounds normal.  II/VI Soft systolic murmur  Abdominal: Soft.  Musculoskeletal: He exhibits edema (1+).  Lymphadenopathy:    He has no cervical adenopathy.  Neurological: He is alert and oriented to person, place, and time.  Skin: Skin is warm and dry.  Psychiatric: Mood, memory, affect and judgment normal.    Lab Results  Component Value Date   WBC 10.1 10/31/2015   HGB 15.6 10/31/2015   HCT 45.4 10/31/2015   PLT 190 10/31/2015   GLUCOSE 307 (H) 10/31/2015   CHOL 136 07/22/2015   TRIG 123 07/22/2015   TRIG 121 07/22/2015   HDL 53 07/22/2015   LDLCALC 58 07/22/2015   TSH 0.726 10/08/2015   PSA 0.3 03/31/2012     INR 1.29 07/22/2015   HGBA1C 12.4 (H) 08/06/2015   MICROALBUR 20 09/04/2014    CMP     Component Value Date/Time   NA 130 (L) 10/31/2015 0732   NA 135 10/08/2015 1633   NA 136 01/02/2014 0439   K 4.0 10/31/2015 0732   K 4.2 01/02/2014 0439   CL 96 (L) 10/31/2015 0732   CL 101 01/02/2014 0439   CO2 26 10/31/2015 0732   CO2 27 01/02/2014 0439   GLUCOSE 307 (H) 10/31/2015 0732   GLUCOSE 152 (H) 01/02/2014 0439   BUN 16 10/31/2015 0732   BUN 21 10/08/2015 1633   BUN 62 (H) 01/02/2014 0439   CREATININE 0.81 10/31/2015 0732   CREATININE 1.96 (H) 01/02/2014 0439   CALCIUM 8.8 (L) 10/31/2015 0732   CALCIUM 7.9 (L) 01/02/2014 0439   PROT 7.3 10/31/2015 0732   PROT 6.7 10/08/2015 1633   PROT 6.6 12/30/2013 1800   ALBUMIN 3.5 10/31/2015 0732   ALBUMIN 3.7 10/08/2015 1633   ALBUMIN 2.7 (L) 12/30/2013 1800   AST 51 (H) 10/31/2015 0732   AST 28 12/30/2013 1800   ALT 58 10/31/2015 0732   ALT 29 12/30/2013 1800   ALKPHOS 146 (H) 10/31/2015 0732   ALKPHOS 102 12/30/2013 1800   BILITOT 1.1 10/31/2015 0732   BILITOT 0.7 10/08/2015 1633   BILITOT 0.5 12/30/2013 1800   GFRNONAA >60 10/31/2015 0732   GFRNONAA 36 (L) 01/02/2014 0439   GFRNONAA 59 (L) 06/07/2012 0527   GFRAA >60 10/31/2015 0732   GFRAA 44 (L) 01/02/2014 0439   GFRAA >60 06/07/2012 0527    Assessment and Plan :  1. DM (diabetes mellitus), secondary (HCC) Patient not taking any of his  medications as directed. Patient to call us back with his insulin regimen.  2. Hyperglycemia   3. Type 2 diabetes mellitus with hyperglycemia, with long-term current use of insulin (HCC)  - Home Health  4. Cerebrovascular accident (CVA), unspecified mechanism (HCC)  - Home Health  5. Confusion  - Home Health  6. Constipation, unspecified constipation type   7. Acute/chronic encephalopathy  - Ambulatory referral  to Neurology  8. Seizures (HCC)  - Ambulatory referral to Neurology  9. Depression, unspecified  depression type  - sertraline (ZOLOFT) 50 MG tablet; Take 1 tablet (50 mg total) by mouth daily.  Dispense: 30 tablet; Refill: 3 10. Known CAD with preserved ejection fraction Medical management. I have done the exam and reviewed the above chart and it is accurate to the best of my knowledge.  Julieanne Manson MD [facility] 11/05/2015 2:19 PM

## 2015-11-05 NOTE — Patient Instructions (Addendum)
Patient is to discontinue his Ferrous sulfate and Loratadine  Patient is to check if he is taking his lactulose

## 2015-11-07 ENCOUNTER — Ambulatory Visit: Payer: PPO | Admitting: Family Medicine

## 2015-11-14 ENCOUNTER — Encounter: Payer: Self-pay | Admitting: Family Medicine

## 2015-11-14 ENCOUNTER — Ambulatory Visit (INDEPENDENT_AMBULATORY_CARE_PROVIDER_SITE_OTHER): Payer: PPO | Admitting: Family Medicine

## 2015-11-14 VITALS — BP 128/72 | HR 76 | Temp 98.4°F | Resp 18 | Wt 262.0 lb

## 2015-11-14 DIAGNOSIS — I10 Essential (primary) hypertension: Secondary | ICD-10-CM

## 2015-11-14 DIAGNOSIS — N400 Enlarged prostate without lower urinary tract symptoms: Secondary | ICD-10-CM

## 2015-11-14 DIAGNOSIS — R569 Unspecified convulsions: Secondary | ICD-10-CM | POA: Diagnosis not present

## 2015-11-14 DIAGNOSIS — Z794 Long term (current) use of insulin: Secondary | ICD-10-CM

## 2015-11-14 DIAGNOSIS — E1111 Type 2 diabetes mellitus with ketoacidosis with coma: Secondary | ICD-10-CM | POA: Diagnosis not present

## 2015-11-14 DIAGNOSIS — I639 Cerebral infarction, unspecified: Secondary | ICD-10-CM

## 2015-11-14 DIAGNOSIS — E118 Type 2 diabetes mellitus with unspecified complications: Secondary | ICD-10-CM | POA: Diagnosis not present

## 2015-11-14 DIAGNOSIS — R410 Disorientation, unspecified: Secondary | ICD-10-CM

## 2015-11-14 DIAGNOSIS — Z2821 Immunization not carried out because of patient refusal: Secondary | ICD-10-CM

## 2015-11-14 LAB — POCT GLYCOSYLATED HEMOGLOBIN (HGB A1C): Hemoglobin A1C: 12.9

## 2015-11-14 NOTE — Progress Notes (Signed)
Jay FallRichard C Vanderveen  MRN: [id number] DOB: 1945-10-31  Subjective:  HPI  Patient is here for follow up after visit on 11/05/15. Depression: he was started on Zoloft and can not tell a difference since starting medication in how he feels.  Diabetes: he is only using levemir insulin. No other insulins or oral medications. He cehcks his sugar fasting and sometimes after breakfast and readings have been around 250-300 or higher. He does have tingling in his feet. He is taking lactulose. Lab Results  Component Value Date   HGBA1C 12.4 (H) 08/06/2015    Patient has been out of his Crestor, Protonix, amlodipine, ampicillin for the past 2 days. He has also stopped Iron and Loratadine as per instructions on his last visit. He states he feels so much better been off the medication, he is thinking more clearly he feels, 75% better overall.  Order for Home health was placed on his last visit but patient states he has not heard anything from anyone and that he does not feel like he needs any help. Patient Active Problem List   Diagnosis Date Noted  . Encephalopathy, hepatic (HCC) 07/26/2015  . Hyperammonemia (HCC) 07/25/2015  . Type 2 diabetes mellitus with hyperglycemia (HCC) 07/25/2015  . Seizures (HCC)   . Hyperglycemia   . Uncontrolled type 2 diabetes mellitus with ketoacidotic coma, with long-term current use of insulin (HCC)   . Stroke (HCC) 07/22/2015  . Respiratory failure (HCC)   . Acute encephalopathy   . Unstable angina (HCC) 06/24/2015  . Diabetes mellitus (HCC) 08/20/2014  . Wound infection 08/17/2014  . Acute kidney failure (HCC) 05/23/2014  . Actinic keratosis 05/23/2014  . Absolute anemia 05/23/2014  . CAD in native artery 05/23/2014  . Depression, major, recurrent (HCC) 05/23/2014  . DM (diabetes mellitus), secondary (HCC) 05/23/2014  . Diabetic retinopathy (HCC) 05/23/2014  . Abnormal LFTs 05/23/2014  . Fatty infiltration of liver 05/23/2014  . Acid reflux 05/23/2014  .  BP (high blood pressure) 05/23/2014  . HLD (hyperlipidemia) 05/23/2014  . Anemia, iron deficiency 05/23/2014  . Metal bone fixation hardware in place 05/23/2014  . Adiposity 05/23/2014  . Obstructive apnea 05/23/2014  . Plaque psoriasis 05/23/2014  . 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 AF (paroxysmal atrial fibrillation) (HCC) 03/01/2014  . Aortic heart valve narrowing 01/05/2014  . Chronic kidney disease (CKD), stage IV (severe) (HCC) 01/05/2014  . Benign essential HTN 01/05/2014    Past Medical History:  Diagnosis Date  . Anemia   . Angina pectoris (HCC)   . Bilateral diabetic retinopathy (HCC)   . CAD (coronary artery disease)   . Diabetes mellitus without complication (HCC)   . Fatty liver   . Foot deformities, congenital   . GERD (gastroesophageal reflux disease)   . Hypertension   . Morbid obesity (HCC)   . Pedal edema   . Sinoatrial node dysfunction (HCC)   . Sleep apnea    not tolerating CPAP    Social History   Social History  . Marital status: Married    Spouse name: N/A  . Number of children: N/A  . Years of education: N/A   Occupational History  . Not on file.   Social History Main Topics  . Smoking status: Never Smoker  . Smokeless tobacco: Never Used  . Alcohol use No  . Drug use: No  . Sexual activity: No   Other Topics Concern  . Not on file   Social History Narrative  . No narrative on file  Outpatient Encounter Prescriptions as of 11/14/2015  Medication Sig  . aspirin 81 MG tablet Take 1 tablet by mouth daily.  Marland Kitchen donepezil (ARICEPT) 5 MG tablet Take 1 tablet (5 mg total) by mouth at bedtime.  . insulin detemir (LEVEMIR) 100 UNIT/ML injection Inject 0.35 mLs (35 Units total) into the skin 2 (two) times daily.  Marland Kitchen lactulose (CHRONULAC) 10 GM/15ML solution Take 30 mLs (20 g total) by mouth 2 (two) times daily.  . meclizine (ANTIVERT) 25 MG tablet TAKE 1 TABLET BY MOUTH 3 TIMES DAILY AS NEEDED FOR DIZZINESS  . mometasone (ELOCON) 0.1 %  cream Apply 1 application topically daily. For 2 weeks  . ondansetron (ZOFRAN ODT) 8 MG disintegrating tablet Take 1 tablet (8 mg total) by mouth every 8 (eight) hours as needed for nausea or vomiting.  . sertraline (ZOLOFT) 50 MG tablet Take 1 tablet (50 mg total) by mouth daily.  Marland Kitchen amLODipine (NORVASC) 5 MG tablet Take 1 tablet (5 mg total) by mouth daily. (Patient not taking: Reported on 11/14/2015)  . ampicillin (PRINCIPEN) 500 MG capsule Take 1 capsule (500 mg total) by mouth 2 (two) times daily. (Patient not taking: Reported on 11/14/2015)  . ferrous sulfate 325 (65 FE) MG tablet Take 1 tablet (325 mg total) by mouth 2 (two) times daily. (Patient not taking: Reported on 11/14/2015)  . insulin aspart (NOVOLOG) 100 UNIT/ML injection Up to 10 units with each meal (Patient not taking: Reported on 11/14/2015)  . lacosamide (VIMPAT) 50 MG TABS tablet Take 1 tablet (50 mg total) by mouth 2 (two) times daily. (Patient not taking: Reported on 11/14/2015)  . loratadine (CLARITIN) 10 MG tablet Take 1 tablet (10 mg total) by mouth daily. (Patient not taking: Reported on 11/14/2015)  . metoprolol tartrate (LOPRESSOR) 25 MG tablet Take 0.5 tablets (12.5 mg total) by mouth 2 (two) times daily. (Patient not taking: Reported on 11/14/2015)  . pantoprazole (PROTONIX) 40 MG tablet TAKE 1 TABLET BY MOUTH TWICE DAILY. (Patient not taking: Reported on 11/14/2015)  . promethazine (PHENERGAN) 25 MG tablet Take 1 tablet (25 mg total) by mouth every 4 (four) hours as needed for nausea or vomiting. (Patient not taking: Reported on 11/14/2015)  . rosuvastatin (CRESTOR) 10 MG tablet TAKE 1 TABLET BY MOUTH AT BEDTIME (Patient not taking: Reported on 11/14/2015)  . tamsulosin (FLOMAX) 0.4 MG CAPS capsule Take 1 capsule (0.4 mg total) by mouth daily. (Patient not taking: Reported on 11/14/2015)   No facility-administered encounter medications on file as of 11/14/2015.     Allergies  Allergen Reactions  . Metformin Nausea  Only    Review of Systems  Constitutional: Positive for malaise/fatigue.  Eyes: Negative.   Respiratory: Negative.   Cardiovascular: Negative.   Gastrointestinal: Negative.   Musculoskeletal: Positive for joint pain and myalgias.  Skin: Negative.   Neurological: Positive for tingling and tremors. Negative for dizziness and weakness.  Endo/Heme/Allergies: Negative.   Psychiatric/Behavioral: Positive for depression.   Objective:  BP 128/72   Pulse 76   Temp 98.4 F (36.9 C)   Resp 18   Wt 262 lb (118.8 kg)   BMI 38.69 kg/m   Physical Exam  Constitutional: He is well-developed, well-nourished, and in no distress.  HENT:  Head: Normocephalic and atraumatic.  Cardiovascular: Normal rate, regular rhythm, normal heart sounds and intact distal pulses.   No murmur heard. Pulmonary/Chest: Effort normal and breath sounds normal. No respiratory distress. He has no wheezes.  Neurological: He is alert.  Skin: Skin is warm  and dry.  Psychiatric: Mood and affect normal.    Assessment and Plan :  1. Uncontrolled type 2 diabetes mellitus with ketoacidotic coma, with long-term current use of insulin (HCC) A1C 12.9 worse. Advised patient to use Levemir 40 units twice daily. - POCT HgB A1C  2. Cerebrovascular accident (CVA), unspecified mechanism (HCC)  3. Confusion Patient feels 75% better today since been off Crestor, Amlodipine, Protonix and Keppra for 2 days. I think it is fine if he stays off the medications but I would like for patient to see neurologist.  4. Essential hypertension Stable off the medication.  5. Benign prostatic hyperplasia, unspecified whether lower urinary tract symptoms present stable  6. Influenza vaccination declined 7. Pneumococcal vaccination declined Patient declined all immunizations.  8. Seizures (HCC) Patient did not feel well "in his head" on Keppra. Ok to be off the medication for now. Advised patient he needs to follow up with neurologist. I  am concerned that patient may not be taking all the necessary medications he should be on. Advised patient he had seizure per Dr Daisy Blossom recent notes and patient should not be driving until he is cleared.  HPI, Exam and A&P transcribed under direction and in the presence of Julieanne Manson, MD. I have done the exam and reviewed the chart and it is accurate to the best of my knowledge. Julieanne Manson M.D. [facility]

## 2015-11-14 NOTE — Patient Instructions (Signed)
Use Levemir 40 units twice daily. We will call you with appointment to Dr Malvin Johns, and we need you to see him please.

## 2015-11-18 ENCOUNTER — Ambulatory Visit: Payer: PPO | Admitting: Family Medicine

## 2015-11-20 DIAGNOSIS — G5603 Carpal tunnel syndrome, bilateral upper limbs: Secondary | ICD-10-CM | POA: Insufficient documentation

## 2015-11-20 DIAGNOSIS — R569 Unspecified convulsions: Secondary | ICD-10-CM | POA: Diagnosis not present

## 2015-11-22 DIAGNOSIS — R569 Unspecified convulsions: Secondary | ICD-10-CM | POA: Diagnosis not present

## 2015-12-11 ENCOUNTER — Ambulatory Visit (INDEPENDENT_AMBULATORY_CARE_PROVIDER_SITE_OTHER): Payer: PPO | Admitting: Family Medicine

## 2015-12-11 ENCOUNTER — Encounter: Payer: Self-pay | Admitting: Family Medicine

## 2015-12-11 VITALS — BP 134/52 | HR 82 | Temp 97.6°F | Resp 16 | Wt 277.0 lb

## 2015-12-11 DIAGNOSIS — R410 Disorientation, unspecified: Secondary | ICD-10-CM

## 2015-12-11 DIAGNOSIS — Z794 Long term (current) use of insulin: Secondary | ICD-10-CM | POA: Diagnosis not present

## 2015-12-11 DIAGNOSIS — R413 Other amnesia: Secondary | ICD-10-CM | POA: Diagnosis not present

## 2015-12-11 DIAGNOSIS — I639 Cerebral infarction, unspecified: Secondary | ICD-10-CM

## 2015-12-11 DIAGNOSIS — E1111 Type 2 diabetes mellitus with ketoacidosis with coma: Secondary | ICD-10-CM

## 2015-12-11 NOTE — Patient Instructions (Signed)
Please bring in medications to your next office visit.

## 2015-12-11 NOTE — Progress Notes (Signed)
Subjective:  HPI Pt is here for a 4 week follow up for diabetes. He reports that his blood sugars have been running from 150-300. It was 300 fasting this morning. But he reports that he ate too many snacks last night. Pt reports that he is feeling better but he sometimes has problems getting out of chair. He reports that he is trying to get his strength back.   He is still having problems with memory and having trouble writing.  He comes alone to his visit today. He says he has stopped a lot of his medications. He is unable to tell us which medications he is taking which she is not.  Prior to Admission medications   Medication Sig Start Date End Date Taking? Authorizing Provider  aspirin 81 MG tablet Take 1 tablet by mouth daily. 12/01/10   Historical Provider, MD  donepezil (ARICEPT) 5 MG tablet Take 1 tablet (5 mg total) by mouth at bedtime. 10/08/15   Aseel Hulen Shouts., MD  insulin detemir (LEVEMIR) 100 UNIT/ML injection Inject 0.35 mLs (35 Units total) into the skin 2 (two) times daily. 08/19/15   Caillou Hulen Shouts., MD  lacosamide (VIMPAT) 50 MG TABS tablet Take 1 tablet (50 mg total) by mouth 2 (two) times daily. Patient not taking: Reported on 11/14/2015 08/08/15   Enid Baas, MD  lactulose (CHRONULAC) 10 GM/15ML solution Take 30 mLs (20 g total) by mouth 2 (two) times daily. 11/05/15   Ellery Hulen Shouts., MD  meclizine (ANTIVERT) 25 MG tablet TAKE 1 TABLET BY MOUTH 3 TIMES DAILY AS NEEDED FOR DIZZINESS 08/08/15   Enid Baas, MD  mometasone (ELOCON) 0.1 % cream Apply 1 application topically daily. For 2 weeks 03/26/14   Historical Provider, MD  ondansetron (ZOFRAN ODT) 8 MG disintegrating tablet Take 1 tablet (8 mg total) by mouth every 8 (eight) hours as needed for nausea or vomiting. 10/31/15   Sharman Cheek, MD  sertraline (ZOLOFT) 50 MG tablet Take 1 tablet (50 mg total) by mouth daily. 11/05/15   Braheem Hulen Shouts., MD    Patient Active Problem List   Diagnosis Date Noted  . Bilateral carpal tunnel syndrome 11/20/2015  . Encephalopathy, hepatic (HCC) 07/26/2015  . Hyperammonemia (HCC) 07/25/2015  . Type 2 diabetes mellitus with hyperglycemia (HCC) 07/25/2015  . Seizure (HCC)   . Hyperglycemia   . Uncontrolled type 2 diabetes mellitus with ketoacidotic coma, with long-term current use of insulin (HCC)   . Stroke (HCC) 07/22/2015  . Respiratory failure (HCC)   . Acute encephalopathy   . Unstable angina (HCC) 06/24/2015  . Diabetes mellitus (HCC) 08/20/2014  . Wound infection 08/17/2014  . Acute kidney failure (HCC) 05/23/2014  . Actinic keratosis 05/23/2014  . Absolute anemia 05/23/2014  . CAD in native artery 05/23/2014  . Depression, major, recurrent (HCC) 05/23/2014  . DM (diabetes mellitus), secondary (HCC) 05/23/2014  . Diabetic retinopathy (HCC) 05/23/2014  . Abnormal LFTs 05/23/2014  . Fatty infiltration of liver 05/23/2014  . Acid reflux 05/23/2014  . BP (high blood pressure) 05/23/2014  . HLD (hyperlipidemia) 05/23/2014  . Anemia, iron deficiency 05/23/2014  . Metal bone fixation hardware in place 05/23/2014  . Adiposity 05/23/2014  . Obstructive apnea 05/23/2014  . Plaque psoriasis 05/23/2014  . 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 AF (paroxysmal atrial fibrillation) (HCC) 03/01/2014  . Aortic heart valve narrowing 01/05/2014  . Chronic kidney disease (CKD), stage IV (severe) (HCC) 01/05/2014  . Benign essential HTN 01/05/2014  Past Medical History:  Diagnosis Date  . Anemia   . Angina pectoris (HCC)   . Bilateral diabetic retinopathy (HCC)   . CAD (coronary artery disease)   . Diabetes mellitus without complication (HCC)   . Fatty liver   . Foot deformities, congenital   . GERD (gastroesophageal reflux disease)   . Hypertension   . Morbid obesity (HCC)   . Pedal edema   . Sinoatrial node dysfunction (HCC)   . Sleep apnea    not tolerating CPAP    Social History   Social History  . Marital status:  Married    Spouse name: N/A  . Number of children: N/A  . Years of education: N/A   Occupational History  . Not on file.   Social History Main Topics  . Smoking status: Never Smoker  . Smokeless tobacco: Never Used  . Alcohol use No  . Drug use: No  . Sexual activity: No   Other Topics Concern  . Not on file   Social History Narrative  . No narrative on file    Allergies  Allergen Reactions  . Metformin Nausea Only    Review of Systems  HENT: Negative.   Eyes: Negative.   Respiratory: Negative.   Cardiovascular: Negative.   Gastrointestinal: Negative.   Genitourinary: Negative.   Musculoskeletal: Negative.   Skin: Negative.   Neurological: Positive for weakness.  Endo/Heme/Allergies: Negative.   Psychiatric/Behavioral: Positive for memory loss.    Immunization History  Administered Date(s) Administered  . Td 11/26/2000    Objective:  BP (!) 134/52 (BP Location: Left Arm, Patient Position: Sitting, Cuff Size: Large)   Pulse 82   Temp 97.6 F (36.4 C) (Oral)   Resp 16   Wt 277 lb (125.6 kg)   BMI 40.91 kg/m   Physical Exam  Constitutional: He is oriented to person, place, and time and well-developed, well-nourished, and in no distress.  Elderly obese white male in no acute distress  HENT:  Head: Normocephalic and atraumatic.  Eyes: Conjunctivae and EOM are normal. Pupils are equal, round, and reactive to light.  Neck: Normal range of motion. Neck supple.  Soft left carotid bruit   Cardiovascular: Normal rate, regular rhythm and intact distal pulses.   Murmur (2/6 systolic murmur) heard. 2/6 systolic murmur and possible soft left carotid bruit. This could be a radiated murmur.  Pulmonary/Chest: Effort normal and breath sounds normal.  Abdominal: Soft.  Musculoskeletal: Normal range of motion. He exhibits edema (1+).  Neurological: He is alert and oriented to person, place, and time. He has normal reflexes. Gait normal. GCS score is 15.  Skin: Skin is  warm and dry.  Psychiatric: Mood and affect normal.    Lab Results  Component Value Date   WBC 10.1 10/31/2015   HGB 15.6 10/31/2015   HCT 45.4 10/31/2015   PLT 190 10/31/2015   GLUCOSE 307 (H) 10/31/2015   CHOL 136 07/22/2015   TRIG 123 07/22/2015   TRIG 121 07/22/2015   HDL 53 07/22/2015   LDLCALC 58 07/22/2015   TSH 0.726 10/08/2015   PSA 0.3 03/31/2012   INR 1.29 07/22/2015   HGBA1C 12.9 11/14/2015   MICROALBUR 20 09/04/2014    CMP     Component Value Date/Time   NA 130 (L) 10/31/2015 0732   NA 135 10/08/2015 1633   NA 136 01/02/2014 0439   K 4.0 10/31/2015 0732   K 4.2 01/02/2014 0439   CL 96 (L) 10/31/2015 0732   CL  101 01/02/2014 0439   CO2 26 10/31/2015 0732   CO2 27 01/02/2014 0439   GLUCOSE 307 (H) 10/31/2015 0732   GLUCOSE 152 (H) 01/02/2014 0439   BUN 16 10/31/2015 0732   BUN 21 10/08/2015 1633   BUN 62 (H) 01/02/2014 0439   CREATININE 0.81 10/31/2015 0732   CREATININE 1.96 (H) 01/02/2014 0439   CALCIUM 8.8 (L) 10/31/2015 0732   CALCIUM 7.9 (L) 01/02/2014 0439   PROT 7.3 10/31/2015 0732   PROT 6.7 10/08/2015 1633   PROT 6.6 12/30/2013 1800   ALBUMIN 3.5 10/31/2015 0732   ALBUMIN 3.7 10/08/2015 1633   ALBUMIN 2.7 (L) 12/30/2013 1800   AST 51 (H) 10/31/2015 0732   AST 28 12/30/2013 1800   ALT 58 10/31/2015 0732   ALT 29 12/30/2013 1800   ALKPHOS 146 (H) 10/31/2015 0732   ALKPHOS 102 12/30/2013 1800   BILITOT 1.1 10/31/2015 0732   BILITOT 0.7 10/08/2015 1633   BILITOT 0.5 12/30/2013 1800   GFRNONAA >60 10/31/2015 0732   GFRNONAA 36 (L) 01/02/2014 0439   GFRNONAA 59 (L) 06/07/2012 0527   GFRAA >60 10/31/2015 0732   GFRAA 44 (L) 01/02/2014 0439   GFRAA >60 06/07/2012 0527    Assessment and Plan :  1. Uncontrolled type 2 diabetes mellitus with ketoacidotic coma, with long-term current use of insulin (HCC)   2. Confusion/Delirium   3. Cerebrovascular accident (CVA), unspecified mechanism (HCC)   4. Memory loss MMSE 23/30 today. .This  has gotten progressively worse in 2017. Discuss with Dr. Malvin JohnsPotter the significant symptoms progression of this and Dr. Malvin JohnsPotter will see the patient next week. 5. CAD All risk factors treated. She will bring in all his medications he is taking on his next visit so we can make sure he is taking a appropriate medication for his problems.  HPI, Exam, and A&P Transcribed under the direction and in the presence of Glenwood L. Wendelyn BreslowGilbert Jr, MD  Electronically Signed: Dimas ChyleBrittany Byrd, CMA I have done the exam and reviewed the above chart and it is accurate to the best of my knowledge. [employer]  technology has been used in this note in any air is in the dictation or transcription are unintentional.  Julieanne Mansonichard Gilbert MD [facility] 12/11/2015 2:46 PM

## 2015-12-17 DIAGNOSIS — F0280 Dementia in other diseases classified elsewhere without behavioral disturbance: Secondary | ICD-10-CM | POA: Diagnosis not present

## 2015-12-17 DIAGNOSIS — E6609 Other obesity due to excess calories: Secondary | ICD-10-CM | POA: Diagnosis not present

## 2015-12-17 DIAGNOSIS — G309 Alzheimer's disease, unspecified: Secondary | ICD-10-CM | POA: Diagnosis not present

## 2015-12-17 DIAGNOSIS — F0150 Vascular dementia without behavioral disturbance: Secondary | ICD-10-CM | POA: Diagnosis not present

## 2015-12-17 DIAGNOSIS — Z6841 Body Mass Index (BMI) 40.0 and over, adult: Secondary | ICD-10-CM | POA: Diagnosis not present

## 2015-12-17 DIAGNOSIS — E0842 Diabetes mellitus due to underlying condition with diabetic polyneuropathy: Secondary | ICD-10-CM | POA: Diagnosis not present

## 2015-12-17 DIAGNOSIS — R569 Unspecified convulsions: Secondary | ICD-10-CM | POA: Diagnosis not present

## 2015-12-18 ENCOUNTER — Telehealth: Payer: Self-pay | Admitting: Family Medicine

## 2016-01-08 DIAGNOSIS — Z6841 Body Mass Index (BMI) 40.0 and over, adult: Secondary | ICD-10-CM | POA: Diagnosis not present

## 2016-01-08 DIAGNOSIS — E6609 Other obesity due to excess calories: Secondary | ICD-10-CM | POA: Diagnosis not present

## 2016-01-08 DIAGNOSIS — Z794 Long term (current) use of insulin: Secondary | ICD-10-CM | POA: Diagnosis not present

## 2016-01-08 DIAGNOSIS — E1165 Type 2 diabetes mellitus with hyperglycemia: Secondary | ICD-10-CM | POA: Diagnosis not present

## 2016-01-09 ENCOUNTER — Ambulatory Visit: Payer: PPO | Admitting: Family Medicine

## 2016-01-23 ENCOUNTER — Ambulatory Visit (INDEPENDENT_AMBULATORY_CARE_PROVIDER_SITE_OTHER): Payer: PPO | Admitting: Family Medicine

## 2016-01-23 VITALS — BP 132/84 | HR 114 | Temp 98.1°F | Resp 20 | Wt 274.0 lb

## 2016-01-23 DIAGNOSIS — R410 Disorientation, unspecified: Secondary | ICD-10-CM

## 2016-01-23 DIAGNOSIS — E6609 Other obesity due to excess calories: Secondary | ICD-10-CM | POA: Diagnosis not present

## 2016-01-23 DIAGNOSIS — I10 Essential (primary) hypertension: Secondary | ICD-10-CM

## 2016-01-23 DIAGNOSIS — E1111 Type 2 diabetes mellitus with ketoacidosis with coma: Secondary | ICD-10-CM | POA: Diagnosis not present

## 2016-01-23 DIAGNOSIS — IMO0001 Reserved for inherently not codable concepts without codable children: Secondary | ICD-10-CM

## 2016-01-23 DIAGNOSIS — E7849 Other hyperlipidemia: Secondary | ICD-10-CM

## 2016-01-23 DIAGNOSIS — Z6841 Body Mass Index (BMI) 40.0 and over, adult: Secondary | ICD-10-CM

## 2016-01-23 DIAGNOSIS — E784 Other hyperlipidemia: Secondary | ICD-10-CM

## 2016-01-23 DIAGNOSIS — Z794 Long term (current) use of insulin: Secondary | ICD-10-CM | POA: Diagnosis not present

## 2016-01-23 NOTE — Patient Instructions (Addendum)
PLEASE BRING IN ALL YOUR MEDICATIONS YOU ARE TAKING AND INSULIN ON YOUR NEXT VISIT WITH [place]  APPOINTMENTS FOR YOU WITH ALL THE DOCTORS COMING UP DR. TROXLER-FOOT DOCTOR-02/18/16  DR. ABBY-ENDOCRINOLOGIST-02/24/16  DR. POTTER-NEUROLOGIST 03/18/16

## 2016-01-23 NOTE — Progress Notes (Signed)
Jay [name]  MRN: [id number] DOB: 1945/02/17  Subjective:  HPI  Patient is here for follow up. Last office visit was on 12/11/15. Diabetes were addressed on 11/14/15 and A1C was 12.9, at that time patient advised to use Levemir 40 units twice daily and he states he is doing that and fasting have been reading around 200-300. He saw Dr Deloria Lair on 01/08/16 for diabetes. He was advised to start Victoza but he can not afford that. Has follow up with Dr Deloria Lair per epic on 02/24/16. MMSE was done on his last visit and it was 23/30. He saw Dr Malvin Johns on 12/17/15 and was started on Aricept. His follow up appointment with him is 03/18/16. Patient does not have his medication list with him today and could not be sure what all medications he is taking currently. He again forgot to bring in his medications. He is living with his son.  Patient Active Problem List   Diagnosis Date Noted  . Bilateral carpal tunnel syndrome 11/20/2015  . Encephalopathy, hepatic (HCC) 07/26/2015  . Hyperammonemia (HCC) 07/25/2015  . Type 2 diabetes mellitus with hyperglycemia (HCC) 07/25/2015  . Seizure (HCC)   . Hyperglycemia   . Uncontrolled type 2 diabetes mellitus with ketoacidotic coma, with long-term current use of insulin (HCC)   . Stroke (HCC) 07/22/2015  . Respiratory failure (HCC)   . Acute encephalopathy   . Unstable angina (HCC) 06/24/2015  . Diabetes mellitus (HCC) 08/20/2014  . Wound infection 08/17/2014  . Acute kidney failure (HCC) 05/23/2014  . Actinic keratosis 05/23/2014  . Absolute anemia 05/23/2014  . CAD in native artery 05/23/2014  . Depression, major, recurrent (HCC) 05/23/2014  . DM (diabetes mellitus), secondary (HCC) 05/23/2014  . Diabetic retinopathy (HCC) 05/23/2014  . Abnormal LFTs 05/23/2014  . Fatty infiltration of liver 05/23/2014  . Acid reflux 05/23/2014  . BP (high blood pressure) 05/23/2014  . HLD (hyperlipidemia) 05/23/2014  . Anemia, iron deficiency 05/23/2014  . Metal  bone fixation hardware in place 05/23/2014  . Adiposity 05/23/2014  . Obstructive apnea 05/23/2014  . Plaque psoriasis 05/23/2014  . 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 AF (paroxysmal atrial fibrillation) (HCC) 03/01/2014  . Aortic heart valve narrowing 01/05/2014  . Chronic kidney disease (CKD), stage IV (severe) (HCC) 01/05/2014  . Benign essential HTN 01/05/2014    Past Medical History:  Diagnosis Date  . Anemia   . Angina pectoris (HCC)   . Bilateral diabetic retinopathy (HCC)   . CAD (coronary artery disease)   . Diabetes mellitus without complication (HCC)   . Fatty liver   . Foot deformities, congenital   . GERD (gastroesophageal reflux disease)   . Hypertension   . Morbid obesity (HCC)   . Pedal edema   . Sinoatrial node dysfunction (HCC)   . Sleep apnea    not tolerating CPAP    Social History   Social History  . Marital status: Married    Spouse name: N/A  . Number of children: N/A  . Years of education: N/A   Occupational History  . Not on file.   Social History Main Topics  . Smoking status: Never Smoker  . Smokeless tobacco: Never Used  . Alcohol use No  . Drug use: No  . Sexual activity: No   Other Topics Concern  . Not on file   Social History Narrative  . No narrative on file    Outpatient Encounter Prescriptions as of 01/23/2016  Medication Sig  . aspirin 81 MG  tablet Take 1 tablet by mouth daily.  . insulin detemir (LEVEMIR) 100 UNIT/ML injection Inject 0.35 mLs (35 Units total) into the skin 2 (two) times daily.  Marland Kitchen. donepezil (ARICEPT) 5 MG tablet Take 1 tablet (5 mg total) by mouth at bedtime.  Marland Kitchen. lacosamide (VIMPAT) 50 MG TABS tablet Take 1 tablet (50 mg total) by mouth 2 (two) times daily. (Patient not taking: Reported on 01/23/2016)  . lactulose (CHRONULAC) 10 GM/15ML solution Take 30 mLs (20 g total) by mouth 2 (two) times daily.  . meclizine (ANTIVERT) 25 MG tablet TAKE 1 TABLET BY MOUTH 3 TIMES DAILY AS NEEDED FOR DIZZINESS  .  mometasone (ELOCON) 0.1 % cream Apply 1 application topically daily. For 2 weeks  . ondansetron (ZOFRAN ODT) 8 MG disintegrating tablet Take 1 tablet (8 mg total) by mouth every 8 (eight) hours as needed for nausea or vomiting.  . sertraline (ZOLOFT) 50 MG tablet Take 1 tablet (50 mg total) by mouth daily. (Patient not taking: Reported on 01/23/2016)   No facility-administered encounter medications on file as of 01/23/2016.     Allergies  Allergen Reactions  . Metformin Nausea Only    Review of Systems  Constitutional: Positive for malaise/fatigue.  Eyes: Negative.   Respiratory: Positive for shortness of breath.   Cardiovascular: Negative.   Gastrointestinal: Negative.   Musculoskeletal: Positive for joint pain and myalgias.  Skin: Negative.   Neurological: Positive for tingling, tremors and weakness.  Endo/Heme/Allergies: Negative.   Psychiatric/Behavioral: Negative.     Objective:  BP 132/84   Pulse (!) 114   Temp 98.1 F (36.7 C)   Resp 20   Wt 274 lb (124.3 kg)   SpO2 98%   BMI 40.46 kg/m   Physical Exam  Constitutional: He is oriented to person, place, and time and well-developed, well-nourished, and in no distress.  HENT:  Head: Normocephalic and atraumatic.  Right Ear: External ear normal.  Left Ear: External ear normal.  Nose: Nose normal.  Eyes: Conjunctivae are normal. Pupils are equal, round, and reactive to light.  Neck: Normal range of motion. Neck supple.  Cardiovascular: Normal rate, regular rhythm, normal heart sounds and intact distal pulses.   No murmur heard. Pulmonary/Chest: Effort normal and breath sounds normal. No respiratory distress. He has no wheezes.  Abdominal: Soft.  Neurological: He is alert and oriented to person, place, and time. No cranial nerve deficit. He exhibits normal muscle tone.  Skin: Skin is warm and dry.  Psychiatric: Mood, memory, affect and judgment normal.    Assessment and Plan :  1. Uncontrolled type 2 diabetes  mellitus with ketoacidotic coma, with long-term current use of insulin [facility](HCC) sees Dr Deloria LairAbby. Has appointment set up with them in January  2. Essential hypertension Stable.  3. Confusion/Mild cognitive impairment Better. Patient is seen Dr Malvin JohnsPotter. Follow as needed. I'm not sure if patient has normal progression of MCI or has a metabolic encephalopathy of uncertain etiology Again, patient was advised to bring in all his medications on his next visit. I am very concerned about his overall safety. 4. Other hyperlipidemia 5. Morbid obesity 6. CAD All risk factors treated. HPI, Exam and A&P transcribed under direction and in the presence of Julieanne Mansonichard , MD. I have done the exam and reviewed the chart and it is accurate to the best of my knowledge. [employer]  technology has been used and  any errors in dictation or transcription are unintentional. Julieanne Mansonichard  M.D. [facility]

## 2016-02-18 DIAGNOSIS — B351 Tinea unguium: Secondary | ICD-10-CM | POA: Diagnosis not present

## 2016-02-18 DIAGNOSIS — E1142 Type 2 diabetes mellitus with diabetic polyneuropathy: Secondary | ICD-10-CM | POA: Diagnosis not present

## 2016-02-24 DIAGNOSIS — Z6841 Body Mass Index (BMI) 40.0 and over, adult: Secondary | ICD-10-CM | POA: Diagnosis not present

## 2016-02-24 DIAGNOSIS — Z794 Long term (current) use of insulin: Secondary | ICD-10-CM | POA: Diagnosis not present

## 2016-02-24 DIAGNOSIS — E1165 Type 2 diabetes mellitus with hyperglycemia: Secondary | ICD-10-CM | POA: Diagnosis not present

## 2016-02-24 DIAGNOSIS — E6609 Other obesity due to excess calories: Secondary | ICD-10-CM | POA: Diagnosis not present

## 2016-03-16 ENCOUNTER — Encounter: Payer: Self-pay | Admitting: Family Medicine

## 2016-03-16 ENCOUNTER — Ambulatory Visit (INDEPENDENT_AMBULATORY_CARE_PROVIDER_SITE_OTHER): Payer: PPO | Admitting: Family Medicine

## 2016-03-16 VITALS — BP 142/72 | HR 99 | Temp 97.9°F | Resp 28 | Wt 309.0 lb

## 2016-03-16 DIAGNOSIS — R609 Edema, unspecified: Secondary | ICD-10-CM | POA: Diagnosis not present

## 2016-03-16 DIAGNOSIS — R197 Diarrhea, unspecified: Secondary | ICD-10-CM

## 2016-03-16 MED ORDER — FUROSEMIDE 20 MG PO TABS: 20.0000 mg | ORAL_TABLET | Freq: Every day | ORAL | 0 refills | Status: DC

## 2016-03-16 NOTE — Patient Instructions (Signed)
Stop Invokana. We will call with the lab results. Try one fluid pill daily. Continue your insulin. Let your diabetes doctor know you are not tolerating the medications well.

## 2016-03-16 NOTE — Progress Notes (Addendum)
Subjective:     Patient ID: Jay [name], male   DOB: 1945/02/05, 71 y.o.   MRN: [id number]  HPI  Chief Complaint  Patient presents with  . Edema    Generalized edema (feet, hands, abdomen, "everywhere"). Pt also c/o diarrhea, decreased urine output, wheezing, some cough, and fatigue since receiving Bydureon injection on 02/24/2016 (prescribed by endocrinologist Dr. Deloria Lair). Pt denies chest pain.   Started on [place] on 02/24/16 per endocrine in addition to Levemir 40 u. Twice daily. He reports sx above over the last week or so. He has discontinued Bydureon. Reports fasting sugars in the 140-150 range. Cognitive impairment attenuates HPI.  Review of Systems  Respiratory: Negative for shortness of breath.   Gastrointestinal: Negative for abdominal pain.       Objective:   Physical Exam  Constitutional: He appears well-developed and well-nourished. No distress.  Cardiovascular: Normal rate and regular rhythm.   2/6 SEM  Pulmonary/Chest: Breath sounds normal.  Abdominal: Soft. There is no tenderness.  Incontinent of loose watery stool.  Musculoskeletal: He exhibits edema (2+ in lower extremities).       Assessment:    1. Diarrhea, unspecified type: ? Secondary to mediation  2. Edema, unspecified type: ? Acute kidney injury - Renal function panel - furosemide (LASIX) 20 MG tablet; Take 1 tablet (20 mg total) by mouth daily.  Dispense: 7 tablet; Refill: 0    Plan:    Stop Invokana and continue insulin. Let your diabetes doctor know of medication discontinuation. Further f/u pending lab work.

## 2016-03-17 LAB — RENAL FUNCTION PANEL
ALBUMIN: 3.1 g/dL — AB (ref 3.6–4.8)
BUN/Creatinine Ratio: 22 (ref 10–24)
BUN: 18 mg/dL (ref 8–27)
CALCIUM: 8.4 mg/dL — AB (ref 8.6–10.2)
CHLORIDE: 92 mmol/L — AB (ref 96–106)
CO2: 22 mmol/L (ref 18–29)
Creatinine, Ser: 0.81 mg/dL (ref 0.76–1.27)
GFR calc Af Amer: 105 mL/min/{1.73_m2} (ref >59)
GFR, EST NON AFRICAN AMERICAN: 91 mL/min/{1.73_m2} (ref >59)
GLUCOSE: 217 mg/dL — AB (ref 65–99)
PHOSPHORUS: 2.7 mg/dL (ref 2.5–4.5)
POTASSIUM: 4.3 mmol/L (ref 3.5–5.2)
SODIUM: 131 mmol/L — AB (ref 134–144)

## 2016-03-18 ENCOUNTER — Encounter: Payer: Self-pay | Admitting: Family Medicine

## 2016-03-18 ENCOUNTER — Ambulatory Visit
Admission: RE | Admit: 2016-03-18 | Discharge: 2016-03-18 | Disposition: A | Discharge status: Home / Self Care | Payer: PPO | Source: Ambulatory Visit | Attending: Family Medicine | Admitting: Family Medicine

## 2016-03-18 ENCOUNTER — Ambulatory Visit (INDEPENDENT_AMBULATORY_CARE_PROVIDER_SITE_OTHER): Payer: PPO | Admitting: Family Medicine

## 2016-03-18 VITALS — BP 124/68 | HR 84 | Temp 98.1°F | Resp 16 | Wt 315.0 lb

## 2016-03-18 DIAGNOSIS — I10 Essential (primary) hypertension: Secondary | ICD-10-CM | POA: Diagnosis not present

## 2016-03-18 DIAGNOSIS — Z794 Long term (current) use of insulin: Secondary | ICD-10-CM

## 2016-03-18 DIAGNOSIS — R05 Cough: Secondary | ICD-10-CM | POA: Diagnosis not present

## 2016-03-18 DIAGNOSIS — R918 Other nonspecific abnormal finding of lung field: Secondary | ICD-10-CM | POA: Insufficient documentation

## 2016-03-18 DIAGNOSIS — E1111 Type 2 diabetes mellitus with ketoacidosis with coma: Secondary | ICD-10-CM | POA: Diagnosis not present

## 2016-03-18 DIAGNOSIS — I2510 Atherosclerotic heart disease of native coronary artery without angina pectoris: Secondary | ICD-10-CM | POA: Diagnosis not present

## 2016-03-18 DIAGNOSIS — R0902 Hypoxemia: Secondary | ICD-10-CM | POA: Diagnosis not present

## 2016-03-18 DIAGNOSIS — I509 Heart failure, unspecified: Secondary | ICD-10-CM | POA: Diagnosis not present

## 2016-03-18 DIAGNOSIS — R635 Abnormal weight gain: Secondary | ICD-10-CM

## 2016-03-18 DIAGNOSIS — R0602 Shortness of breath: Secondary | ICD-10-CM | POA: Diagnosis not present

## 2016-03-18 DIAGNOSIS — R609 Edema, unspecified: Secondary | ICD-10-CM

## 2016-03-18 MED ORDER — FUROSEMIDE 40 MG PO TABS: 40.0000 mg | ORAL_TABLET | Freq: Every day | ORAL | 0 refills | Status: DC

## 2016-03-18 NOTE — Progress Notes (Signed)
Patient: Jay [name] Male    DOB: [birth year]   71 y.o.   MRN: [id number] Visit Date: 03/18/2016  Today's Provider: Megan Mans, MD   Chief Complaint  Patient presents with  . Edema   Subjective:    HPI     Follow up for Edema  The patient was last seen for this 2 days ago. Changes made at last visit include adding Lasix 20 mg x 7 days. Pt was seen by Jay [name] on 03/16/2016, and was c/o edema, weight gain, diarrhea, decreased urine output, wheezing and some cough. Pt believed the sx were caused by Bydureon that was prescribed by Dr. Deloria [name] (endo), and he D/C that medication.  He reports good compliance with treatment. He feels that condition is Worse. Pt is still c/o decreased urine output. Diarrhea is slowly improving. Pt states he feels like he has a cold. He is c/o fatigue, weakness, decreased appetite, chills. Pt has gained 6 pounds since LOV 2 days ago. Pt has gained 41 pounds since last follow up on 01/23/2016. Patient is a very his poor historian who I think now lives with his son. I think his wife has left and [place]. His son works a  full-time job as a Curator and looks in on him but is unable to provide much care. ------------------------------------------------------------------------------------    Allergies  Allergen Reactions  . Metformin Nausea Only     Current Outpatient Prescriptions:  .  aspirin 81 MG tablet, Take 1 tablet by mouth daily., Disp: , Rfl:  .  canagliflozin (INVOKANA) 100 MG TABS tablet, Take by mouth., Disp: , Rfl:  .  donepezil (ARICEPT) 5 MG tablet, Take 1 tablet (5 mg total) by mouth at bedtime., Disp: 30 tablet, Rfl: 12 .  furosemide (LASIX) 20 MG tablet, Take 1 tablet (20 mg total) by mouth daily., Disp: 7 tablet, Rfl: 0 .  insulin detemir (LEVEMIR) 100 UNIT/ML injection, Inject 0.35 mLs (35 Units total) into the skin 2 (two) times daily., Disp: 10 mL, Rfl: 11 .  lacosamide (VIMPAT) 50 MG TABS  tablet, Take 1 tablet (50 mg total) by mouth 2 (two) times daily., Disp: 60 tablet, Rfl: 2 .  lactulose (CHRONULAC) 10 GM/15ML solution, Take 30 mLs (20 g total) by mouth 2 (two) times daily., Disp: 946 mL, Rfl: 6 .  meclizine (ANTIVERT) 25 MG tablet, TAKE 1 TABLET BY MOUTH 3 TIMES DAILY AS NEEDED FOR DIZZINESS, Disp: 90 tablet, Rfl: 5 .  mometasone (ELOCON) 0.1 % cream, Apply 1 application topically daily. For 2 weeks, Disp: , Rfl:  .  ondansetron (ZOFRAN ODT) 8 MG disintegrating tablet, Take 1 tablet (8 mg total) by mouth every 8 (eight) hours as needed for nausea or vomiting. (Patient not taking: Reported on 03/18/2016), Disp: 20 tablet, Rfl: 0 .  sertraline (ZOLOFT) 50 MG tablet, Take 1 tablet (50 mg total) by mouth daily. (Patient not taking: Reported on 01/23/2016), Disp: 30 tablet, Rfl: 3  Review of Systems  Constitutional: Positive for activity change, appetite change, chills, fatigue and unexpected weight change. Negative for diaphoresis and fever.  Respiratory: Positive for cough, shortness of breath and wheezing.   Cardiovascular: Negative for chest pain.  Gastrointestinal: Positive for diarrhea.  Genitourinary: Positive for decreased urine volume.  Neurological: Positive for weakness.  Psychiatric/Behavioral: Negative.     Social History  Substance Use Topics  . Smoking status: Never Smoker  . Smokeless tobacco: Never Used  . Alcohol  use No   Objective:   BP 124/68 (BP Location: Left Arm, Patient Position: Sitting, Cuff Size: Large)   Pulse 84   Temp 98.1 F (36.7 C) (Oral)   Resp 16   Wt (!) 315 lb (142.9 kg)   SpO2 90%   BMI 46.52 kg/m   Physical Exam  Constitutional: He appears well-developed and well-nourished.  HENT:  Head: Normocephalic and atraumatic.  Right Ear: External ear normal.  Left Ear: External ear normal.  Nose: Nose normal.  Eyes: No scleral icterus.  Neck: No thyromegaly present.  Cardiovascular: Normal rate, regular rhythm and normal heart  sounds.   Pulmonary/Chest: Effort normal.  Abdominal: Soft. He exhibits distension. There is no tenderness. There is no rebound.  Musculoskeletal: He exhibits edema (3+ edema).  2+ lower extremity edema.  Lymphadenopathy:    He has no cervical adenopathy.  Neurological: He is alert. No cranial nerve deficit. He exhibits normal muscle tone. Coordination normal.  Skin: Skin is warm and dry.  Psychiatric: He has a normal mood and affect. His behavior is normal.        Assessment & Plan:     1. CAD in native artery Presently asymptomatic.   2. Essential hypertension   3. Abnormal weight gain Patient has gained 40 pounds since Christmas. I can only believe this is excess fluid. Renal function appears to be normal. Attempt to diurese. - TSH  4. Hypoxia Mild--r/o CHF. - DG Chest 2 View - CBC with Differential/Platelet - Comprehensive metabolic panel - Brain natriuretic peptide  5. Uncontrolled type 2 diabetes mellitus with ketoacidotic coma, with long-term current use of insulin (HCC) Stop Invokana.Stop Bydurion-0-already done.   6. Chronic congestive heart failure, unspecified congestive heart failure type (HCC) Double lasix and RTC 5-6 days.  7. Edema, unspecified type Worsening. Unclear etiology. Will check CXR and labs. FU pending results. D/C Invokana and increase Lasix to 40 mg po qd. Recheck in 1 week. Consider hepatic insult/failure.  - furosemide (LASIX) 40 MG tablet; Take 1 tablet (40 mg total) by mouth daily.  Dispense: 60 tablet; Refill: 0  8. Cognitive impairment I am concerned that this is the root of the issues. I am concerned he is not taking his medications as he should. May need social services/home health consult. Encephalopathy?    Patient seen and examined by Julieanne Manson, MD, and note scribed by Jay [name], CMA. I have done the exam and reviewed the above chart and it is accurate to the best of my knowledge. Dentist has been used in  this note in any air is in the dictation or transcription are unintentional.  Jay Mans, MD  [facility]

## 2016-03-18 NOTE — Patient Instructions (Signed)
Increase Lasix (furosemide) to 40 mg daily. Stop taking Invokana. Make sure you are taking Lactulose as prescribed (liquid medicine).

## 2016-03-19 ENCOUNTER — Encounter: Payer: Self-pay | Admitting: Medical Oncology

## 2016-03-19 ENCOUNTER — Emergency Department
Admission: EM | Admit: 2016-03-19 | Discharge: 2016-03-19 | Disposition: A | Discharge status: Home / Self Care | Payer: PPO | Attending: Emergency Medicine | Admitting: Emergency Medicine

## 2016-03-19 ENCOUNTER — Telehealth: Payer: Self-pay

## 2016-03-19 ENCOUNTER — Emergency Department: Payer: PPO

## 2016-03-19 DIAGNOSIS — M7989 Other specified soft tissue disorders: Secondary | ICD-10-CM | POA: Diagnosis not present

## 2016-03-19 DIAGNOSIS — R609 Edema, unspecified: Secondary | ICD-10-CM | POA: Diagnosis not present

## 2016-03-19 DIAGNOSIS — R600 Localized edema: Secondary | ICD-10-CM | POA: Diagnosis not present

## 2016-03-19 DIAGNOSIS — I2510 Atherosclerotic heart disease of native coronary artery without angina pectoris: Secondary | ICD-10-CM | POA: Diagnosis not present

## 2016-03-19 DIAGNOSIS — E1122 Type 2 diabetes mellitus with diabetic chronic kidney disease: Secondary | ICD-10-CM | POA: Insufficient documentation

## 2016-03-19 DIAGNOSIS — N184 Chronic kidney disease, stage 4 (severe): Secondary | ICD-10-CM | POA: Diagnosis not present

## 2016-03-19 DIAGNOSIS — Z79899 Other long term (current) drug therapy: Secondary | ICD-10-CM | POA: Insufficient documentation

## 2016-03-19 DIAGNOSIS — Z7982 Long term (current) use of aspirin: Secondary | ICD-10-CM | POA: Diagnosis not present

## 2016-03-19 DIAGNOSIS — Z794 Long term (current) use of insulin: Secondary | ICD-10-CM | POA: Insufficient documentation

## 2016-03-19 DIAGNOSIS — R0602 Shortness of breath: Secondary | ICD-10-CM | POA: Diagnosis not present

## 2016-03-19 DIAGNOSIS — I129 Hypertensive chronic kidney disease with stage 1 through stage 4 chronic kidney disease, or unspecified chronic kidney disease: Secondary | ICD-10-CM | POA: Insufficient documentation

## 2016-03-19 LAB — CBC WITH DIFFERENTIAL/PLATELET
BASOS ABS: 0 10*3/uL (ref 0.0–0.2)
Basos: 0 %
EOS (ABSOLUTE): 0.3 10*3/uL (ref 0.0–0.4)
Eos: 3 %
HEMOGLOBIN: 12.1 g/dL — AB (ref 13.0–17.7)
Hematocrit: 37.8 % (ref 37.5–51.0)
IMMATURE GRANS (ABS): 0 10*3/uL (ref 0.0–0.1)
Immature Granulocytes: 0 %
LYMPHS: 10 %
Lymphocytes Absolute: 0.9 10*3/uL (ref 0.7–3.1)
MCH: 28.6 pg (ref 26.6–33.0)
MCHC: 32 g/dL (ref 31.5–35.7)
MCV: 89 fL (ref 79–97)
MONOCYTES: 11 %
Monocytes Absolute: 1 10*3/uL — ABNORMAL HIGH (ref 0.1–0.9)
Neutrophils Absolute: 6.4 10*3/uL (ref 1.4–7.0)
Neutrophils: 76 %
Platelets: 296 10*3/uL (ref 150–379)
RBC: 4.23 x10E6/uL (ref 4.14–5.80)
RDW: 14.3 % (ref 12.3–15.4)
WBC: 8.7 10*3/uL (ref 3.4–10.8)

## 2016-03-19 LAB — COMPREHENSIVE METABOLIC PANEL
ALBUMIN: 3.2 g/dL — AB (ref 3.6–4.8)
ALK PHOS: 186 IU/L — AB (ref 39–117)
ALT: 35 IU/L (ref 0–44)
AST: 36 IU/L (ref 0–40)
Albumin/Globulin Ratio: 1 — ABNORMAL LOW (ref 1.2–2.2)
BUN / CREAT RATIO: 19 (ref 10–24)
BUN: 14 mg/dL (ref 8–27)
Bilirubin Total: 0.7 mg/dL (ref 0.0–1.2)
CALCIUM: 8.3 mg/dL — AB (ref 8.6–10.2)
CO2: 26 mmol/L (ref 18–29)
CREATININE: 0.72 mg/dL — AB (ref 0.76–1.27)
Chloride: 93 mmol/L — ABNORMAL LOW (ref 96–106)
GFR calc non Af Amer: 95 mL/min/{1.73_m2} (ref >59)
GFR, EST AFRICAN AMERICAN: 110 mL/min/{1.73_m2} (ref >59)
GLUCOSE: 263 mg/dL — AB (ref 65–99)
Globulin, Total: 3.3 g/dL (ref 1.5–4.5)
Potassium: 4.3 mmol/L (ref 3.5–5.2)
Sodium: 134 mmol/L (ref 134–144)
Total Protein: 6.5 g/dL (ref 6.0–8.5)

## 2016-03-19 LAB — BASIC METABOLIC PANEL
Anion gap: 8 (ref 5–15)
BUN: 15 mg/dL (ref 6–20)
CHLORIDE: 98 mmol/L — AB (ref 101–111)
CO2: 28 mmol/L (ref 22–32)
Calcium: 8.3 mg/dL — ABNORMAL LOW (ref 8.9–10.3)
Creatinine, Ser: 0.67 mg/dL (ref 0.61–1.24)
GFR calc non Af Amer: >60 mL/min (ref >60)
Glucose, Bld: 297 mg/dL — ABNORMAL HIGH (ref 65–99)
POTASSIUM: 4.2 mmol/L (ref 3.5–5.1)
SODIUM: 134 mmol/L — AB (ref 135–145)

## 2016-03-19 LAB — CBC
HEMATOCRIT: 37.2 % — AB (ref 40.0–52.0)
Hemoglobin: 12.5 g/dL — ABNORMAL LOW (ref 13.0–18.0)
MCH: 29 pg (ref 26.0–34.0)
MCHC: 33.6 g/dL (ref 32.0–36.0)
MCV: 86.3 fL (ref 80.0–100.0)
Platelets: 285 10*3/uL (ref 150–440)
RBC: 4.3 MIL/uL — AB (ref 4.40–5.90)
RDW: 14.4 % (ref 11.5–14.5)
WBC: 7.2 10*3/uL (ref 3.8–10.6)

## 2016-03-19 LAB — TROPONIN I: Troponin I: <0.03 ng/mL (ref <0.03)

## 2016-03-19 LAB — BRAIN NATRIURETIC PEPTIDE
B Natriuretic Peptide: 240 pg/mL — ABNORMAL HIGH (ref 0.0–100.0)
BNP: 206.7 pg/mL — AB (ref 0.0–100.0)

## 2016-03-19 LAB — URINALYSIS, COMPLETE (UACMP) WITH MICROSCOPIC
BILIRUBIN URINE: NEGATIVE
Bacteria, UA: NONE SEEN
Glucose, UA: NEGATIVE mg/dL
KETONES UR: NEGATIVE mg/dL
LEUKOCYTES UA: NEGATIVE
Nitrite: NEGATIVE
PROTEIN: NEGATIVE mg/dL
SPECIFIC GRAVITY, URINE: 1.004 — AB (ref 1.005–1.030)
pH: 5 (ref 5.0–8.0)

## 2016-03-19 LAB — TSH: TSH: 0.445 u[IU]/mL — ABNORMAL LOW (ref 0.450–4.500)

## 2016-03-19 MED ORDER — FUROSEMIDE 10 MG/ML IJ SOLN
80.0000 mg | Freq: Once | INTRAMUSCULAR | Status: AC
  Administered 2016-03-19: 80 mg via INTRAVENOUS
  Filled 2016-03-19: qty 8

## 2016-03-19 NOTE — Telephone Encounter (Signed)
Advised patient of message he states that he feels more "loaded" today, when I asked patient to clarify he states that he is worse. I advised patient that recommendation is to go to ER today. I asked patient if he would be compliant and seek treatment today and he was in agreeance that he will be going. KW

## 2016-03-19 NOTE — ED Provider Notes (Signed)
Time Seen: Approximately 1505  I have reviewed the triage notes  Chief Complaint: Shortness of Breath and Leg Swelling   History of Present Illness: Jay [name] is a 71 y.o. male *who was referred here by his primary physician for evaluation of fluid retention. The patient reports gaining approximately 60 pounds over the last couple of weeks. He states he feels swelling in both his legs and his upper extremities. He's had some mild shortness of breath with no chest pain and denies any fever or chills or productive cough. He does have a history of coronary artery disease and states that he has "" a couple of stents "". He is followed locally by Dr. Gwen Pounds for cardiology but he has not seen him for a "" some time "". The patient apparently was started on oral Lasix and the patient did not have any significant improvement. The patient states he doesn't feel he has urinated more than usual and the fluid still has retained at this time.   Past Medical History:  Diagnosis Date  . Anemia   . Angina pectoris (HCC)   . Bilateral diabetic retinopathy (HCC)   . CAD (coronary artery disease)   . Diabetes mellitus without complication (HCC)   . Fatty liver   . Foot deformities, congenital   . GERD (gastroesophageal reflux disease)   . Hypertension   . Morbid obesity (HCC)   . Pedal edema   . Sinoatrial node dysfunction (HCC)   . Sleep apnea    not tolerating CPAP    Patient Active Problem List   Diagnosis Date Noted  . Congestive heart failure (CHF) (HCC) 03/18/2016  . Bilateral carpal tunnel syndrome 11/20/2015  . Encephalopathy, hepatic (HCC) 07/26/2015  . Hyperammonemia (HCC) 07/25/2015  . Type 2 diabetes mellitus with hyperglycemia (HCC) 07/25/2015  . Seizure (HCC)   . Uncontrolled type 2 diabetes mellitus with ketoacidotic coma, with long-term current use of insulin (HCC)   . Stroke (HCC) 07/22/2015  . Respiratory failure (HCC)   . Acute encephalopathy   . Unstable angina  (HCC) 06/24/2015  . Wound infection 08/17/2014  . Acute kidney failure (HCC) 05/23/2014  . Actinic keratosis 05/23/2014  . Absolute anemia 05/23/2014  . CAD in native artery 05/23/2014  . Depression, major, recurrent (HCC) 05/23/2014  . Diabetic retinopathy (HCC) 05/23/2014  . Abnormal LFTs 05/23/2014  . Fatty infiltration of liver 05/23/2014  . Acid reflux 05/23/2014  . BP (high blood pressure) 05/23/2014  . HLD (hyperlipidemia) 05/23/2014  . Anemia, iron deficiency 05/23/2014  . Metal bone fixation hardware in place 05/23/2014  . Adiposity 05/23/2014  . Obstructive apnea 05/23/2014  . Plaque psoriasis 05/23/2014  . 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 AF (paroxysmal atrial fibrillation) (HCC) 03/01/2014  . Aortic heart valve narrowing 01/05/2014  . Chronic kidney disease (CKD), stage IV (severe) (HCC) 01/05/2014  . Benign essential HTN 01/05/2014    Past Surgical History:  Procedure Laterality Date  . CARDIAC CATHETERIZATION Left 07/10/2015   Procedure: Coronary Angiography;  Surgeon: Lamar Blinks, MD;  Location: [facility] INVASIVE CV LAB;  Service: Cardiovascular;  Laterality: Left;  . CORONARY ANGIOPLASTY WITH STENT PLACEMENT  2006 and 2008    Past Surgical History:  Procedure Laterality Date  . CARDIAC CATHETERIZATION Left 07/10/2015   Procedure: Coronary Angiography;  Surgeon: Lamar Blinks, MD;  Location: [facility] INVASIVE CV [facility];  Service: Cardiovascular;  Laterality: Left;  . CORONARY ANGIOPLASTY WITH STENT PLACEMENT  2006 and 2008    Current Outpatient  Rx  . Order #: [id number] Class: Historical Med  . Order #: [id number] Class: Historical Med  . Order #: [id number] Class: Normal  . Order #: [id number] Class: Normal  . Order #: [id number] Class: Normal  . Order #: [id number] Class: Print  . Order #: [id number] Class: Normal  . Order #: [id number] Class: Normal  . Order #: [id number] Class: Historical Med  . Order #: [id number] Class: Print  . Order #: [id number] Class: Normal     Allergies:  Metformin  Family History: Family History  Problem Relation Age of Onset  . Leukemia Mother   . Cancer Father   . Deep vein thrombosis Father   . Psychiatric Illness Brother   . Heart disease Brother     Social History: Social History  Substance Use Topics  . Smoking status: Never Smoker  . Smokeless tobacco: Never Used  . Alcohol use No     Review of Systems:   10 point review of systems was performed and was otherwise negative:  Constitutional: No fever Eyes: No visual disturbances ENT: No sore throat, ear pain Cardiac: No chest pain Respiratory:Patient has some orthopnea but no shortness of breath with ambulation Abdomen: No abdominal pain, no vomiting, No diarrhea Endocrine: No weight loss, No night sweats Extremities: Swelling is symmetric in both lower extremities. He denies any calf tenderness Skin: No rashes, easy bruising Neurologic: No focal weakness, trouble with speech or swollowing Urologic: No dysuria, Hematuria, or urinary frequency   Physical Exam:  ED Triage Vitals  Enc Vitals Group     BP 03/19/16 1306 (!) 142/74     Pulse Rate 03/19/16 1306 90     Resp 03/19/16 1306 (!) 22     Temp 03/19/16 1306 98 F (36.7 C)     Temp Source 03/19/16 1306 Oral     SpO2 03/19/16 1306 94 %     Weight 03/19/16 1308 (!) 313 lb 9 oz (142.2 kg)     Height 03/19/16 1308 5' 9.5" (1.765 m)     Head Circumference --      Peak Flow --      Pain Score --      Pain Loc --      Pain Edu? --      Excl. in GC? --     General: Awake , Alert , and Oriented times 3; GCS 15 Head: Normal cephalic , atraumatic Eyes: Pupils equal , round, reactive to light Nose/Throat: No nasal drainage, patent upper airway without erythema or exudate.  Neck: Supple, Full range of motion, No anterior adenopathy or palpable thyroid masses Lungs: Clear to ascultation without wheezes , rhonchi, or rales Heart: Regular rate, regular rhythm . 3/6 systolic ejection murmur  left sternal border  Abdomen: Soft, non tender without rebound, guarding , or rigidity; bowel sounds positive and symmetric in all 4 quadrants. No organomegaly .        Extremities:3+ pitting edema anterior shaft both tibias and appears to be symmetric and circumferential. No Homans sign or focal calf tenderness.  Neurologic: normal ambulation, Motor symmetric without deficits, sensory intact Skin: warm, dry, no rashes   Labs:   All laboratory work was reviewed including any pertinent negatives or positives listed below:  Labs Reviewed  BASIC METABOLIC PANEL - Abnormal; Notable for the following:       Result Value   Sodium 134 (*)    Chloride 98 (*)    Glucose, Bld 297 (*)    Calcium 8.3 (*)    All other components within normal limits  CBC - Abnormal; Notable for the following:    RBC 4.30 (*)    Hemoglobin 12.5 (*)    HCT 37.2 (*)    All other components within normal limits  BRAIN NATRIURETIC PEPTIDE - Abnormal; Notable for the following:    B Natriuretic Peptide 240.0 (*)    All other components within normal limits  URINALYSIS, COMPLETE (UACMP) WITH MICROSCOPIC - Abnormal; Notable for the following:    Color, Urine STRAW (*)    APPearance CLEAR (*)    Specific Gravity, Urine 1.004 (*)    Hgb urine dipstick MODERATE (*)    Squamous Epithelial / LPF 0-5 (*)    All other components within normal limits  TROPONIN I    EKG: * ED ECG REPORT I, Jennye Moccasin, the attending physician, personally viewed and interpreted this ECG.  Date: 03/19/2016 EKG Time: *1305 Rate:89 Rhythm: normal sinus rhythm QRS Axis: normal Intervals: normal ST/T Wave abnormalities: normal Conduction Disturbances: none Narrative Interpretation: unremarkable No acute ischemic changes   Radiology:  "Dg Chest 2 View  Result Date: 03/19/2016 CLINICAL DATA:  Sensation of bloating and being full of fluid with increase shortness of breath over the past week. History of coronary artery disease  with stent placement, CHF, diabetes, and pedal edema. EXAM: CHEST  2 VIEW COMPARISON:  Chest x-ray of March 18, 2016 FINDINGS: Again demonstrated is alveolar opacity in the right lung. The perihilar lung markings on the left remain increased as well. There is no pleural effusion. The cardiac silhouette is enlarged. The central pulmonary vascularity is engorged and there is mild cephalization. Coronary artery stents are visible. The thoracic spine exhibits multilevel degenerative disc disease. IMPRESSION: Findings compatible with CHF with interstitial and alveolar edema. There has not been significant change since yesterday's study. No pleural effusion is observed. Electronically Signed   By: David  [place] M.D.   On: 03/19/2016 14:00   Dg Chest 2 View  Result Date: 03/18/2016 CLINICAL DATA:  71 y/o  M; cough and shortness of breath. EXAM: CHEST  2 VIEW COMPARISON:  08/05/2015 chest radiograph FINDINGS: Patchy opacification of the lungs with central predominance probably represents mild alveolar pulmonary edema. Pneumonia is not excluded. Stable cardiac silhouette within normal limits. No acute osseous abnormality is identified. No pleural effusion. IMPRESSION: Patchy opacification of the lungs with central predominance probably represents mild alveolar pulmonary edema. Pneumonia is not excluded. Electronically Signed   By: Mitzi Hansen M.D.   On: 03/18/2016 20:21  "  I personally reviewed the radiologic studies     ED Course: Patient's stay here was essentially uneventful. Patient was given IV 80 mg of Lasix here and had approximately a liter of urine output. His bladder scan shows cities able to empty his bladder completely and his renal function is within normal limits. No protein in his urine to suspect a nephrotic syndrome that is overall albumin is low. She was advised continue with his Lasix prescription at home and did touch base with his primary physician but also fell is important  for him to see his cardiologist for possible outpatient echocardiogram. He ambulated here around the emergency department and his pulse ox remained at 95% and is otherwise asymptomatic and I did not see a reason to hospitalize the patient on this visit.     Assessment: Acute peripheral edema Mild pulmonary edema Chronic heart murmur   Final Clinical Impression  Final diagnoses:  Peripheral edema     Plan:  Outpatient Continue Lasix twice a  day Patient was advised to return immediately if condition worsens. Patient was advised to follow up with their primary care physician or other specialized physicians involved in their outpatient care. The patient and/or family member/power of attorney had laboratory results reviewed at the bedside. All questions and concerns were addressed and appropriate discharge instructions were distributed by the nursing staff.             Jennye Moccasin, MD 03/19/16 2005

## 2016-03-19 NOTE — Telephone Encounter (Signed)
thanks

## 2016-03-19 NOTE — ED Triage Notes (Signed)
Pt here with c/o sob that began today, pt reports in the last week he has gained 60lbs. Pt reports that he was just recently placed on fluid pill and doesn't feel like it is helping much. Pt in NAD at this time. Respirations non labored.

## 2016-03-19 NOTE — Discharge Instructions (Signed)
Please return immediately if condition worsens. Please contact her primary physician or the physician you were given for referral. If you have any specialist physicians involved in her treatment and plan please also contact them. Thank you for using [place] [facility]. ° °

## 2016-03-19 NOTE — ED Notes (Signed)
While ambulating pt denied SOB. Pt dropped stats from 93% to 88% at the beginning of ambulating. At end of ambulating pt was back to 93% and 1 minute after sitting down pt is back to 99% on RA.

## 2016-03-19 NOTE — Telephone Encounter (Signed)
-----   Message from Maple Hudson., MD sent at 03/19/2016  8:13 AM EST ----- Patient is to double Lasix for CHF. If he is feeling worse today sent him to ED.

## 2016-03-23 ENCOUNTER — Other Ambulatory Visit: Payer: Self-pay | Admitting: Family Medicine

## 2016-03-23 DIAGNOSIS — F32A Depression, unspecified: Secondary | ICD-10-CM

## 2016-03-23 DIAGNOSIS — F329 Major depressive disorder, single episode, unspecified: Secondary | ICD-10-CM

## 2016-03-25 ENCOUNTER — Ambulatory Visit (INDEPENDENT_AMBULATORY_CARE_PROVIDER_SITE_OTHER): Payer: PPO | Admitting: Family Medicine

## 2016-03-25 ENCOUNTER — Encounter: Payer: Self-pay | Admitting: Family Medicine

## 2016-03-25 VITALS — BP 132/70 | HR 90 | Temp 97.8°F | Resp 18 | Wt 313.0 lb

## 2016-03-25 DIAGNOSIS — I10 Essential (primary) hypertension: Secondary | ICD-10-CM

## 2016-03-25 DIAGNOSIS — R635 Abnormal weight gain: Secondary | ICD-10-CM | POA: Diagnosis not present

## 2016-03-25 DIAGNOSIS — R0902 Hypoxemia: Secondary | ICD-10-CM

## 2016-03-25 DIAGNOSIS — R609 Edema, unspecified: Secondary | ICD-10-CM

## 2016-03-25 MED ORDER — METOLAZONE 2.5 MG PO TABS: 2.5000 mg | ORAL_TABLET | Freq: Two times a day (BID) | ORAL | 2 refills | Status: DC

## 2016-03-25 NOTE — Progress Notes (Signed)
Subjective:  HPI Pt was seen on 03/18/16 for HTN, CAD and abnormal weight gain. He had a 40 pound weight gain since christmas. Renal function normal attempt to diurese. Hypoxia ordered CXR to r/o CHF. Increase lasix to 40 mg daily. He ended up going to the ER on 03/19/16 for shortness of breath and leg swelling. Checked renal fx and bladder scan, ok. Question an out patient echo and continue lasix BID.   Pt reports that he feels about the same. He still feel like he is bloated. He has only lost 1 pound. He is taking lasix 40 mg BID per the ER. He reports that he may feel like a lot of the fluid is down a little in his feet.   He is having no chest pain or any more dyspnea/PND/Orthopnea. Prior to Admission medications   Medication Sig Start Date End Date Taking? Authorizing Provider  aspirin 81 MG tablet Take 1 tablet by mouth daily. 12/01/10   Historical Provider, MD  canagliflozin (INVOKANA) 100 MG TABS tablet Take by mouth. 02/24/16   Historical Provider, MD  donepezil (ARICEPT) 5 MG tablet Take 1 tablet (5 mg total) by mouth at bedtime. 10/08/15   Rajohn Hulen Shouts., MD  furosemide (LASIX) 40 MG tablet Take 1 tablet (40 mg total) by mouth daily. 03/18/16   Isidoro Hulen Shouts., MD  insulin detemir (LEVEMIR) 100 UNIT/ML injection Inject 0.35 mLs (35 Units total) into the skin 2 (two) times daily. 08/19/15   Jaquaveon Hulen Shouts., MD  lacosamide (VIMPAT) 50 MG TABS tablet Take 1 tablet (50 mg total) by mouth 2 (two) times daily. 08/08/15   Enid Baas, MD  lactulose (CHRONULAC) 10 GM/15ML solution Take 30 mLs (20 g total) by mouth 2 (two) times daily. 11/05/15   Arlow Hulen Shouts., MD  meclizine (ANTIVERT) 25 MG tablet TAKE 1 TABLET BY MOUTH 3 TIMES DAILY AS NEEDED FOR DIZZINESS 08/08/15   Enid Baas, MD  mometasone (ELOCON) 0.1 % cream Apply 1 application topically daily. For 2 weeks 03/26/14   Historical Provider, MD  ondansetron (ZOFRAN ODT) 8 MG disintegrating tablet Take 1 tablet  (8 mg total) by mouth every 8 (eight) hours as needed for nausea or vomiting. Patient not taking: Reported on 03/18/2016 10/31/15   Sharman Cheek, MD  sertraline (ZOLOFT) 50 MG tablet TAKE 1 TABLET BY MOUTH ONCE DAILY 03/24/16   Maple Hudson., MD    Patient Active Problem List   Diagnosis Date Noted  . Congestive heart failure (CHF) (HCC) 03/18/2016  . Bilateral carpal tunnel syndrome 11/20/2015  . Encephalopathy, hepatic (HCC) 07/26/2015  . Hyperammonemia (HCC) 07/25/2015  . Type 2 diabetes mellitus with hyperglycemia (HCC) 07/25/2015  . Seizure (HCC)   . Uncontrolled type 2 diabetes mellitus with ketoacidotic coma, with long-term current use of insulin (HCC)   . Stroke (HCC) 07/22/2015  . Respiratory failure (HCC)   . Acute encephalopathy   . Unstable angina (HCC) 06/24/2015  . Wound infection 08/17/2014  . Acute kidney failure (HCC) 05/23/2014  . Actinic keratosis 05/23/2014  . Absolute anemia 05/23/2014  . CAD in native artery 05/23/2014  . Depression, major, recurrent (HCC) 05/23/2014  . Diabetic retinopathy (HCC) 05/23/2014  . Abnormal LFTs 05/23/2014  . Fatty infiltration of liver 05/23/2014  . Acid reflux 05/23/2014  . BP (high blood pressure) 05/23/2014  . HLD (hyperlipidemia) 05/23/2014  . Anemia, iron deficiency 05/23/2014  . Metal bone fixation hardware in place 05/23/2014  . Adiposity  05/23/2014  . Obstructive apnea 05/23/2014  . Plaque psoriasis 05/23/2014  . 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 AF (paroxysmal atrial fibrillation) (HCC) 03/01/2014  . Aortic heart valve narrowing 01/05/2014  . Chronic kidney disease (CKD), stage IV (severe) (HCC) 01/05/2014  . Benign essential HTN 01/05/2014    Past Medical History:  Diagnosis Date  . Anemia   . Angina pectoris (HCC)   . Bilateral diabetic retinopathy (HCC)   . CAD (coronary artery disease)   . Diabetes mellitus without complication (HCC)   . Fatty liver   . Foot deformities, congenital   . GERD  (gastroesophageal reflux disease)   . Hypertension   . Morbid obesity (HCC)   . Pedal edema   . Sinoatrial node dysfunction (HCC)   . Sleep apnea    not tolerating CPAP    Social History   Social History  . Marital status: Married    Spouse name: N/A  . Number of children: N/A  . Years of education: N/A   Occupational History  . Not on file.   Social History Main Topics  . Smoking status: Never Smoker  . Smokeless tobacco: Never Used  . Alcohol use No  . Drug use: No  . Sexual activity: No   Other Topics Concern  . Not on file   Social History Narrative  . No narrative on file    Allergies  Allergen Reactions  . Metformin Nausea Only    Review of Systems  Constitutional: Positive for malaise/fatigue.  HENT: Negative.   Eyes: Negative.   Respiratory: Positive for shortness of breath.   Cardiovascular: Positive for leg swelling.  Gastrointestinal: Negative.   Genitourinary: Negative.   Musculoskeletal: Negative.   Skin: Negative.   Neurological: Negative.   Endo/Heme/Allergies: Negative.   Psychiatric/Behavioral: Positive for memory loss.    Immunization History  Administered Date(s) Administered  . Td 11/26/2000    Objective:  BP 132/70 (BP Location: Left Arm, Patient Position: Sitting, Cuff Size: Large)   Pulse 90   Temp 97.8 F (36.6 C) (Oral)   Resp 18   Wt (!) 313 lb (142 kg)   SpO2 92%   BMI 45.56 kg/m   Physical Exam  Constitutional: He is oriented to person, place, and time and well-developed, well-nourished, and in no distress.  HENT:  Head: Normocephalic and atraumatic.  Right Ear: External ear normal.  Left Ear: External ear normal.  Nose: Nose normal.  Eyes: Conjunctivae are normal.  Neck: No thyromegaly present.  Cardiovascular: Normal rate, regular rhythm and normal heart sounds.   Pulmonary/Chest: Effort normal and breath sounds normal.  Abdominal: Soft.  Musculoskeletal: He exhibits edema.  2+ LE edema noted    Neurological: He is alert and oriented to person, place, and time.  Skin: Skin is warm and dry.  Venous stasis changed.  Psychiatric: Mood and affect normal.    Lab Results  Component Value Date   WBC 7.2 03/19/2016   HGB 12.5 (L) 03/19/2016   HCT 37.2 (L) 03/19/2016   PLT 285 03/19/2016   GLUCOSE 297 (H) 03/19/2016   CHOL 136 07/22/2015   TRIG 123 07/22/2015   TRIG 121 07/22/2015   HDL 53 07/22/2015   LDLCALC 58 07/22/2015   TSH 0.445 (L) 03/18/2016   PSA 0.3 03/31/2012   INR 1.29 07/22/2015   HGBA1C 12.9 11/14/2015   MICROALBUR 20 09/04/2014    CMP     Component Value Date/Time   NA 134 (L) 03/19/2016 1316   NA  134 03/18/2016 1517   NA 136 01/02/2014 0439   K 4.2 03/19/2016 1316   K 4.2 01/02/2014 0439   CL 98 (L) 03/19/2016 1316   CL 101 01/02/2014 0439   CO2 28 03/19/2016 1316   CO2 27 01/02/2014 0439   GLUCOSE 297 (H) 03/19/2016 1316   GLUCOSE 152 (H) 01/02/2014 0439   BUN 15 03/19/2016 1316   BUN 14 03/18/2016 1517   BUN 62 (H) 01/02/2014 0439   CREATININE 0.67 03/19/2016 1316   CREATININE 1.96 (H) 01/02/2014 0439   CALCIUM 8.3 (L) 03/19/2016 1316   CALCIUM 7.9 (L) 01/02/2014 0439   PROT 6.5 03/18/2016 1517   PROT 6.6 12/30/2013 1800   ALBUMIN 3.2 (L) 03/18/2016 1517   ALBUMIN 2.7 (L) 12/30/2013 1800   AST 36 03/18/2016 1517   AST 28 12/30/2013 1800   ALT 35 03/18/2016 1517   ALT 29 12/30/2013 1800   ALKPHOS 186 (H) 03/18/2016 1517   ALKPHOS 102 12/30/2013 1800   BILITOT 0.7 03/18/2016 1517   BILITOT 0.5 12/30/2013 1800   GFRNONAA >60 03/19/2016 1316   GFRNONAA 36 (L) 01/02/2014 0439   GFRNONAA 59 (L) 06/07/2012 0527   GFRAA >60 03/19/2016 1316   GFRAA 44 (L) 01/02/2014 0439   GFRAA >60 06/07/2012 0527    Assessment and Plan :  1. Edema, unspecified type/CHF Clinically stable but I think he needs more diuresis Add Zaroxalyn. To see cardiology soon. - metolazone (ZAROXOLYN) 2.5 MG tablet; Take 1 tablet (2.5 mg total) by mouth 2 (two) times  daily.  Dispense: 60 tablet; Refill: 2  2. Essential hypertension   3. Abnormal weight gain   4. Hypoxia Improved today from last week. 5.Morbid Obesity 6.TIIDM Poorly controlled. 7.Encephalopathy/Cognitive impairment  I have done the exam and reviewed the above chart and it is accurate to the best of my knowledge. Dentist has been used in this note in any air is in the dictation or transcription are unintentional.  Julieanne Manson MD [facility] 03/25/2016 1:58 PM

## 2016-03-27 DIAGNOSIS — I5021 Acute systolic (congestive) heart failure: Secondary | ICD-10-CM | POA: Diagnosis not present

## 2016-03-27 DIAGNOSIS — I2510 Atherosclerotic heart disease of native coronary artery without angina pectoris: Secondary | ICD-10-CM | POA: Diagnosis not present

## 2016-03-27 DIAGNOSIS — R600 Localized edema: Secondary | ICD-10-CM | POA: Diagnosis not present

## 2016-03-27 DIAGNOSIS — I480 Paroxysmal atrial fibrillation: Secondary | ICD-10-CM | POA: Diagnosis not present

## 2016-04-07 ENCOUNTER — Encounter: Payer: Self-pay | Admitting: Physician Assistant

## 2016-04-07 ENCOUNTER — Ambulatory Visit (INDEPENDENT_AMBULATORY_CARE_PROVIDER_SITE_OTHER): Payer: PPO | Admitting: Physician Assistant

## 2016-04-07 ENCOUNTER — Ambulatory Visit
Admission: RE | Admit: 2016-04-07 | Discharge: 2016-04-07 | Disposition: A | Discharge status: Home / Self Care | Payer: PPO | Source: Ambulatory Visit | Attending: Physician Assistant | Admitting: Physician Assistant

## 2016-04-07 VITALS — BP 136/78 | HR 76 | Temp 98.6°F | Resp 16 | Wt 287.0 lb

## 2016-04-07 DIAGNOSIS — I509 Heart failure, unspecified: Secondary | ICD-10-CM

## 2016-04-07 DIAGNOSIS — E1111 Type 2 diabetes mellitus with ketoacidosis with coma: Secondary | ICD-10-CM | POA: Diagnosis not present

## 2016-04-07 DIAGNOSIS — I10 Essential (primary) hypertension: Secondary | ICD-10-CM

## 2016-04-07 DIAGNOSIS — Z794 Long term (current) use of insulin: Secondary | ICD-10-CM | POA: Diagnosis not present

## 2016-04-07 DIAGNOSIS — J90 Pleural effusion, not elsewhere classified: Secondary | ICD-10-CM | POA: Diagnosis not present

## 2016-04-07 DIAGNOSIS — R0602 Shortness of breath: Secondary | ICD-10-CM | POA: Insufficient documentation

## 2016-04-07 MED ORDER — POTASSIUM CHLORIDE ER 10 MEQ PO TBCR: 10.0000 meq | EXTENDED_RELEASE_TABLET | Freq: Two times a day (BID) | ORAL | 0 refills | Status: AC

## 2016-04-07 NOTE — Patient Instructions (Signed)
Heart Failure °Heart failure means your heart has trouble pumping blood. This makes it hard for your body to work well. Heart failure is usually a long-term (chronic) condition. You must take good care of yourself and follow your doctor's treatment plan. °Follow these instructions at home: °· Take your heart medicine as told by your doctor. °? Do not stop taking medicine unless your doctor tells you to. °? Do not skip any dose of medicine. °? Refill your medicines before they run out. °? Take other medicines only as told by your doctor or pharmacist. °· Stay active if told by your doctor. The elderly and people with severe heart failure should talk with a doctor about physical activity. °· Eat heart-healthy foods. Choose foods that are without trans fat and are low in saturated fat, cholesterol, and salt (sodium). This includes fresh or frozen fruits and vegetables, fish, lean meats, fat-free or low-fat dairy foods, whole grains, and high-fiber foods. Lentils and dried peas and beans (legumes) are also good choices. °· Limit salt if told by your doctor. °· Cook in a healthy way. Roast, grill, broil, bake, poach, steam, or stir-fry foods. °· Limit fluids as told by your doctor. °· Weigh yourself every morning. Do this after you pee (urinate) and before you eat breakfast. Write down your weight to give to your doctor. °· Take your blood pressure and write it down if your doctor tells you to. °· Ask your doctor how to check your pulse. Check your pulse as told. °· Lose weight if told by your doctor. °· Stop smoking or chewing tobacco. Do not use gum or patches that help you quit without your doctor's approval. °· Schedule and go to doctor visits as told. °· Nonpregnant women should have no more than 1 drink a day. Men should have no more than 2 drinks a day. Talk to your doctor about drinking alcohol. °· Stop illegal drug use. °· Stay current with shots (immunizations). °· Manage your health conditions as told by your  doctor. °· Learn to manage your stress. °· Rest when you are tired. °· If it is really hot outside: °? Avoid intense activities. °? Use air conditioning or fans, or get in a cooler place. °? Avoid caffeine and alcohol. °? Wear loose-fitting, lightweight, and light-colored clothing. °· If it is really cold outside: °? Avoid intense activities. °? Layer your clothing. °? Wear mittens or gloves, a hat, and a scarf when going outside. °? Avoid alcohol. °· Learn about heart failure and get support as needed. °· Get help to maintain or improve your quality of life and your ability to care for yourself as needed. °Contact a doctor if: °· You gain weight quickly. °· You are more short of breath than usual. °· You cannot do your normal activities. °· You tire easily. °· You cough more than normal, especially with activity. °· You have any or more puffiness (swelling) in areas such as your hands, feet, ankles, or belly (abdomen). °· You cannot sleep because it is hard to breathe. °· You feel like your heart is beating fast (palpitations). °· You get dizzy or light-headed when you stand up. °Get help right away if: °· You have trouble breathing. °· There is a change in mental status, such as becoming less alert or not being able to focus. °· You have chest pain or discomfort. °· You faint. °This information is not intended to replace advice given to you by your health care provider. Make sure you   discuss any questions you have with your health care provider. °Document Released: 10/29/2007 Document Revised: 06/27/2015 Document Reviewed: 03/07/2012 °Elsevier Interactive Patient Education © 2017 [employer] ° °

## 2016-04-07 NOTE — Progress Notes (Signed)
Patient: Jay [name] Male    DOB: 1945-08-29   71 y.o.   MRN: [id number] Visit Date: 04/07/2016  Today's Provider: Trey Sailors, PA-C   Chief Complaint  Patient presents with  . Fatigue    Started about two weeks ago.    Subjective:    HPI  Fatigue: Patient complains of fatigue. Symptoms began about two weeks ago.. Sentinal symptom the patient feels fatigue began with: none. Symptoms of his fatigue have been change in appetite, feelings of depression and general malaise. Patient describes the following psychologic symptoms: none.  Patient denies exercise intolerance, unusual rashes and GI blood loss. Symptoms have gradually worsened. Severity has been struggles to carry out day to day responsibilities.. Previous visits for this problem: none.   Patient is a 71 y/o man with multiple chronic illnesses including CHF, unstable angina, HTN, uncontrolled Type II Dm, and hepatic encephalopathy presenting today with about two weeks of "feeling badly." He reports he has no energy, has low appetite. He is not having any headache, chest pain, SOB above his baseline. He has a mild nonproductive cough but no fevers or chills. He denies jaundice, confusion, problems with bowels/bladders. Endorses feeling like having some belly fluid. He also reports him having lower extremity edema but this has improved since his last visit.  Notably, he was seen in clinic on 03/25/2016 by Dr. Sullivan Lone for edema. At that visit, he weight 313 lbs. Metolazone 2.5 mg BID was added to his medication list. Additionally, he is taking 40 mg Lasix BID. Today he weighs 286 lbs.  He visited Dr. Gwen Pounds in cardiology on 03/27/2016 for above health issue and was encouraged to continue with diuretics. Also scheduled for echocardiogram for further evaluation of symptoms.     Allergies  Allergen Reactions  . Metformin Nausea Only     Current Outpatient Prescriptions:  .  aspirin 81 MG tablet, Take 1 tablet by  mouth daily., Disp: , Rfl:  .  carvedilol (COREG) 3.125 MG tablet, Take 3.125 mg by mouth daily., Disp: , Rfl:  .  donepezil (ARICEPT) 5 MG tablet, Take 1 tablet (5 mg total) by mouth at bedtime., Disp: 30 tablet, Rfl: 12 .  furosemide (LASIX) 40 MG tablet, Take 1 tablet (40 mg total) by mouth daily. (Patient taking differently: Take 40 mg by mouth 2 (two) times daily. ), Disp: 60 tablet, Rfl: 0 .  insulin detemir (LEVEMIR) 100 UNIT/ML injection, Inject 0.35 mLs (35 Units total) into the skin 2 (two) times daily., Disp: 10 mL, Rfl: 11 .  lacosamide (VIMPAT) 50 MG TABS tablet, Take 1 tablet (50 mg total) by mouth 2 (two) times daily., Disp: 60 tablet, Rfl: 2 .  meclizine (ANTIVERT) 25 MG tablet, TAKE 1 TABLET BY MOUTH 3 TIMES DAILY AS NEEDED FOR DIZZINESS, Disp: 90 tablet, Rfl: 5 .  mometasone (ELOCON) 0.1 % cream, Apply 1 application topically daily. For 2 weeks, Disp: , Rfl:  .  ondansetron (ZOFRAN ODT) 8 MG disintegrating tablet, Take 1 tablet (8 mg total) by mouth every 8 (eight) hours as needed for nausea or vomiting., Disp: 20 tablet, Rfl: 0 .  sertraline (ZOLOFT) 50 MG tablet, TAKE 1 TABLET BY MOUTH ONCE DAILY, Disp: 30 tablet, Rfl: 11 .  lactulose (CHRONULAC) 10 GM/15ML solution, Take 30 mLs (20 g total) by mouth 2 (two) times daily., Disp: 946 mL, Rfl: 6 .  potassium chloride (K-DUR) 10 MEQ tablet, Take 1 tablet (10 mEq total) by mouth  2 (two) times daily., Disp: 180 tablet, Rfl: 0  Review of Systems  Constitutional: Positive for activity change, appetite change and fatigue. Negative for chills, diaphoresis, fever and unexpected weight change.  HENT: Positive for rhinorrhea. Negative for congestion, ear discharge, ear pain, postnasal drip, sinus pain, sinus pressure, sneezing and sore throat.   Eyes: Negative.   Respiratory: Positive for cough, shortness of breath and wheezing. Negative for apnea, choking, chest tightness and stridor.   Cardiovascular: Positive for leg swelling. Negative  for chest pain and palpitations.  Gastrointestinal: Positive for nausea. Negative for abdominal distention, abdominal pain, anal bleeding, blood in stool, constipation, diarrhea, rectal pain and vomiting.  Musculoskeletal: Positive for arthralgias. Negative for back pain, gait problem, joint swelling, myalgias, neck pain and neck stiffness.  Neurological: Positive for light-headedness and numbness. Negative for dizziness, tremors, seizures, syncope, weakness and headaches.    Social History  Substance Use Topics  . Smoking status: Never Smoker  . Smokeless tobacco: Never Used  . Alcohol use No   Objective:   BP 136/78 (BP Location: Right Wrist, Patient Position: Sitting, Cuff Size: Normal)   Pulse 76   Temp 98.6 F (37 C) (Oral)   Resp 16   Wt 287 lb (130.2 kg)   SpO2 90%   BMI 41.77 kg/m  Vitals:   04/07/16 1129  BP: 136/78  Pulse: 76  Resp: 16  Temp: 98.6 F (37 C)  TempSrc: Oral  SpO2: 90%  Weight: 287 lb (130.2 kg)     Physical Exam  Constitutional: He appears well-developed and well-nourished. No distress.  Eyes: Conjunctivae are normal.  Neck: Neck supple. No JVD present.  Cardiovascular: Normal rate, regular rhythm and intact distal pulses.  Exam reveals no gallop and no friction rub.   Murmur heard.  Systolic murmur is present with a grade of 3/6  Pulmonary/Chest: No respiratory distress. He has no wheezes. He has no rales.  Mildly SOB, though speaking in full sentences. No accessory muscle use. Decreased breath sounds in bilateral lower lung fields.   Abdominal: Soft. Bowel sounds are normal. He exhibits distension and fluid wave. He exhibits no mass. There is no tenderness. There is no rebound and no guarding.  Musculoskeletal: He exhibits edema.  Lymphadenopathy:    He has no cervical adenopathy.  Neurological: He is alert.  Skin: Skin is warm and dry.  Psychiatric: He exhibits a depressed mood.        Assessment & Plan:     1. Chronic congestive  heart failure, unspecified congestive heart failure type [facility])  Patient with multiple comorbidities recently increased on diuretics with metolazone 2.5 BID with resultant ~30 lb weight loss presenting today with not feeling well. No respiratory distress. Normotensive today in office with pulse ox at recent baseline. Fluid overloaded today in clinic, though he appears to be somewhat congruent with his baseline. Possible electrolyte imbalance with potassium wasting 2/2 diuretic, possible acute on chronic worsening of CHF. Have instructed to stop diuretic until he sees Dr. Sullivan Lone tomorrow, and start potassium supplement. Have ordered Xrays as below to further assess volume status and cardiology/pulmonary etiology as well as potassium, renal and liver function right now. Have discussed this patient with Dr. Sullivan Lone, who will see him tomorrow in clinic.  - DG Chest 2 View; Future - B Nat Peptide - Comprehensive Metabolic Panel (CMET) - DG Chest Bilateral Decubitus  2. Benign essential HTN  - potassium chloride (K-DUR) 10 MEQ tablet; Take 1 tablet (10 mEq total) by mouth  2 (two) times daily.  Dispense: 180 tablet; Refill: 0 - Comprehensive Metabolic Panel (CMET)  3. Uncontrolled type 2 diabetes mellitus with ketoacidotic coma, with long-term current use of insulin (HCC)  I have spent 25 minutes with this patient, > 50% of which was spent on counseling and coordination of care.  The entirety of the information documented in the History of Present Illness, Review of Systems and Physical Exam were personally obtained by me. Portions of this information were initially documented by Kavin Leech, CMA and reviewed by me for thoroughness and accuracy.          Trey Sailors, PA-C  [facility]

## 2016-04-08 ENCOUNTER — Ambulatory Visit (INDEPENDENT_AMBULATORY_CARE_PROVIDER_SITE_OTHER): Payer: PPO

## 2016-04-08 ENCOUNTER — Ambulatory Visit: Payer: PPO | Admitting: Family Medicine

## 2016-04-08 ENCOUNTER — Telehealth: Payer: Self-pay

## 2016-04-08 VITALS — BP 132/76 | HR 68 | Temp 97.1°F | Resp 20 | Wt 285.0 lb

## 2016-04-08 VITALS — BP 142/72 | HR 76 | Temp 97.1°F | Ht 70.0 in | Wt 285.4 lb

## 2016-04-08 DIAGNOSIS — R609 Edema, unspecified: Secondary | ICD-10-CM

## 2016-04-08 DIAGNOSIS — Z0000 Encounter for general adult medical examination without abnormal findings: Secondary | ICD-10-CM

## 2016-04-08 DIAGNOSIS — G3184 Mild cognitive impairment, so stated: Secondary | ICD-10-CM

## 2016-04-08 DIAGNOSIS — I10 Essential (primary) hypertension: Secondary | ICD-10-CM

## 2016-04-08 DIAGNOSIS — I509 Heart failure, unspecified: Secondary | ICD-10-CM

## 2016-04-08 DIAGNOSIS — Z2821 Immunization not carried out because of patient refusal: Secondary | ICD-10-CM | POA: Diagnosis not present

## 2016-04-08 DIAGNOSIS — Z794 Long term (current) use of insulin: Secondary | ICD-10-CM

## 2016-04-08 DIAGNOSIS — E1111 Type 2 diabetes mellitus with ketoacidosis with coma: Secondary | ICD-10-CM

## 2016-04-08 LAB — COMPREHENSIVE METABOLIC PANEL
ALT: 39 IU/L (ref 0–44)
AST: 42 IU/L — ABNORMAL HIGH (ref 0–40)
Albumin/Globulin Ratio: 0.9 — ABNORMAL LOW (ref 1.2–2.2)
Albumin: 3.5 g/dL — ABNORMAL LOW (ref 3.6–4.8)
Alkaline Phosphatase: 228 IU/L — ABNORMAL HIGH (ref 39–117)
BUN/Creatinine Ratio: 25 — ABNORMAL HIGH (ref 10–24)
BUN: 24 mg/dL (ref 8–27)
Bilirubin Total: 1.1 mg/dL (ref 0.0–1.2)
CO2: 31 mmol/L — ABNORMAL HIGH (ref 18–29)
Calcium: 9 mg/dL (ref 8.6–10.2)
Chloride: 87 mmol/L — ABNORMAL LOW (ref 96–106)
Creatinine, Ser: 0.96 mg/dL (ref 0.76–1.27)
GFR calc Af Amer: 93 mL/min/{1.73_m2} (ref >59)
GFR calc non Af Amer: 80 mL/min/{1.73_m2} (ref >59)
Globulin, Total: 3.8 g/dL (ref 1.5–4.5)
Glucose: 135 mg/dL — ABNORMAL HIGH (ref 65–99)
Potassium: 4.1 mmol/L (ref 3.5–5.2)
Sodium: 132 mmol/L — ABNORMAL LOW (ref 134–144)
Total Protein: 7.3 g/dL (ref 6.0–8.5)

## 2016-04-08 LAB — POCT GLYCOSYLATED HEMOGLOBIN (HGB A1C): Hemoglobin A1C: 9.2

## 2016-04-08 LAB — BRAIN NATRIURETIC PEPTIDE: BNP: 471.5 pg/mL — ABNORMAL HIGH (ref 0.0–100.0)

## 2016-04-08 MED ORDER — METOLAZONE 2.5 MG PO TABS: 2.5000 mg | ORAL_TABLET | Freq: Every day | ORAL | 0 refills | Status: DC

## 2016-04-08 NOTE — Telephone Encounter (Signed)
Pt advised today during office visit-aa

## 2016-04-08 NOTE — Telephone Encounter (Signed)
-----   Message from Trey Sailors, [place] sent at 04/07/2016  4:09 PM EST ----- CXR shows interval improvement of chest with regards to CHF, some small pleural effusions with minimal volume. Dr. Sullivan Lone will see the patient in clinic tomorrow.

## 2016-04-08 NOTE — Progress Notes (Signed)
Subjective:   Jay [name] is a 71 y.o. male who presents for an Initial Medicare Annual Wellness Visit.  Review of Systems  N/A  Cardiac Risk Factors include: advanced age (>46men, >72 women);diabetes mellitus;dyslipidemia;hypertension;male gender;obesity (BMI >30kg/m2)    Objective:    Today's Vitals   04/08/16 1301  BP: (!) 142/72  Pulse: 76  Temp: 97.1 F (36.2 C)  TempSrc: Oral  Weight: 285 lb 6.4 oz (129.5 kg)  Height: 5\' 10"  (1.778 m)  PainSc: 0-No pain   Body mass index is 40.95 kg/m.  Current Medications (verified) Outpatient Encounter Prescriptions as of 04/08/2016  Medication Sig  . aspirin 81 MG tablet Take 1 tablet by mouth daily.  . insulin detemir (LEVEMIR) 100 UNIT/ML injection Inject 0.35 mLs (35 Units total) into the skin 2 (two) times daily.  . carvedilol (COREG) 3.125 MG tablet Take 3.125 mg by mouth daily.  Marland Kitchen donepezil (ARICEPT) 5 MG tablet Take 1 tablet (5 mg total) by mouth at bedtime.  . furosemide (LASIX) 40 MG tablet Take 1 tablet (40 mg total) by mouth daily. (Patient taking differently: Take 40 mg by mouth 2 (two) times daily. )  . lacosamide (VIMPAT) 50 MG TABS tablet Take 1 tablet (50 mg total) by mouth 2 (two) times daily.  Marland Kitchen lactulose (CHRONULAC) 10 GM/15ML solution Take 30 mLs (20 g total) by mouth 2 (two) times daily. (Patient not taking: Reported on 04/08/2016)  . meclizine (ANTIVERT) 25 MG tablet TAKE 1 TABLET BY MOUTH 3 TIMES DAILY AS NEEDED FOR DIZZINESS  . mometasone (ELOCON) 0.1 % cream Apply 1 application topically daily. For 2 weeks  . ondansetron (ZOFRAN ODT) 8 MG disintegrating tablet Take 1 tablet (8 mg total) by mouth every 8 (eight) hours as needed for nausea or vomiting.  . potassium chloride (K-DUR) 10 MEQ tablet Take 1 tablet (10 mEq total) by mouth 2 (two) times daily.  . sertraline (ZOLOFT) 50 MG tablet TAKE 1 TABLET BY MOUTH ONCE DAILY   No facility-administered encounter medications on file as of 04/08/2016.      Allergies (verified) Metformin   History: Past Medical History:  Diagnosis Date  . Anemia   . Angina pectoris (HCC)   . Bilateral diabetic retinopathy (HCC)   . CAD (coronary artery disease)   . Diabetes mellitus without complication (HCC)   . Fatty liver   . Foot deformities, congenital   . GERD (gastroesophageal reflux disease)   . Hyperlipidemia   . Hypertension   . Morbid obesity (HCC)   . Pedal edema   . Sinoatrial node dysfunction (HCC)   . Sleep apnea    not tolerating CPAP   Past Surgical History:  Procedure Laterality Date  . CARDIAC CATHETERIZATION Left 07/10/2015   Procedure: Coronary Angiography;  Surgeon: Lamar Blinks, MD;  Location: [facility] INVASIVE CV LAB;  Service: Cardiovascular;  Laterality: Left;  . CORONARY ANGIOPLASTY WITH STENT PLACEMENT  2006 and 2008   Family History  Problem Relation Age of Onset  . Leukemia Mother   . Cancer Father   . Deep vein thrombosis Father   . Psychiatric Illness Brother   . Heart disease Brother    Social History   Occupational History  . Not on file.   Social History Main Topics  . Smoking status: Never Smoker  . Smokeless tobacco: Never Used  . Alcohol use 0.0 oz/week     Comment: seldomly- 1 beer  . Drug use: No  . Sexual activity: No  Tobacco Counseling Counseling given: Not Answered   Activities of Daily Living In your present state of health, do you have any difficulty performing the following activities: 04/08/2016 08/06/2015  Hearing? Malvin Johns  Vision? N N  Difficulty concentrating or making decisions? Malvin Johns  Walking or climbing stairs? N N  Dressing or bathing? N N  Doing errands, shopping? N N  Preparing Food and eating ? N -  Using the Toilet? N -  In the past six months, have you accidently leaked urine? Y -  Do you have problems with loss of bowel control? N -  Managing your Medications? N -  Managing your Finances? N -  Housekeeping or managing your Housekeeping? N -  Some recent data might  be hidden    Immunizations and Health Maintenance Immunization History  Administered Date(s) Administered  . Td 11/26/2000   There are no preventive care reminders to display for this patient.  Patient Care Team: Maple Hudson., MD as PCP - General (Family Medicine) Sallee Lange, MD as Consulting Physician (Ophthalmology) Lamar Blinks, MD as Consulting Physician (Cardiology) Recardo Evangelist, DPM as Attending Physician (Podiatry) Morene Crocker, MD as Referring Physician (Neurology)  Indicate any recent Medical Services you may have received from other than [facility] providers in the past year (date may be approximate).    Assessment:   This is a routine wellness examination for Jay [name].   Hearing/Vision screen Vision Screening Comments: Pt sees Dr Dellie Burns for vision checks yearly.   Dietary issues and exercise activities discussed: Current Exercise Habits: Home exercise routine, Type of exercise: walking, Time (Minutes): 10, Frequency (Times/Week): 5, Weekly Exercise (Minutes/Week): 50, Intensity: Mild  Goals    . Increase water intake          Recommend increasing water intake to 4-5 glasses a day.      Depression Screen PHQ 2/9 Scores 04/08/2016 07/16/2015 09/04/2014  PHQ - 2 Score 0 1 0    Fall Risk Fall Risk  04/08/2016 07/16/2015  Falls in the past year? Yes No  Number falls in past yr: 2 or more -  Injury with Fall? No -  Follow up Falls prevention discussed -    Cognitive Function:     6CIT Screen 04/08/2016  What Year? 4 points  What month? 3 points  What time? 0 points  Count back from 20 0 points  Months in reverse 2 points  Repeat phrase 6 points  Total Score 15    Screening Tests Health Maintenance  Topic Date Due  . OPHTHALMOLOGY EXAM  07/25/2016 (Originally 12/02/1955)  . INFLUENZA VACCINE  11/19/2016 (Originally 09/03/2015)  . PNA vac Low Risk Adult (1 of 2 - PCV13) 11/19/2016 (Originally 12/03/2011)  . TETANUS/TDAP  11/20/2016  (Originally 11/27/2010)  . HEMOGLOBIN A1C  05/14/2016  . FOOT EXAM  02/17/2017  . URINE MICROALBUMIN  02/23/2017  . COLONOSCOPY  02/29/2024  . Hepatitis C Screening  Completed        Plan:  I have personally reviewed and addressed the Medicare Annual Wellness questionnaire and have noted the following in the patient's chart:  A. Medical and social history B. Use of alcohol, tobacco or illicit drugs  C. Current medications and supplements D. Functional ability and status E.  Nutritional status F.  Physical activity G. Advance directives H. List of other physicians I.  Hospitalizations, surgeries, and ER visits in previous 12 months J.  Vitals K. Screenings such as hearing and vision if needed, cognitive  and depression L. Referrals and appointments - none  In addition, I have reviewed and discussed with patient certain preventive protocols, quality metrics, and best practice recommendations. A written personalized care plan for preventive services as well as general preventive health recommendations were provided to patient.  See attached scanned questionnaire for additional information.   Signed,  Hyacinth Meeker, LPN Nurse Health Advisor   MD Recommendations: None. Pt declined influenza vaccine, pneumonia vaccine and tetanus. Pt has appointment this year for eye exam, however he is unsure of date.  F/u on cognitive issues. I have reviewed the health advisors note, was  available for consultation and I agree with documentation and plan. Julieanne Manson MD [facility]

## 2016-04-08 NOTE — Telephone Encounter (Signed)
Tried calling; no answer 04/08/2016  Thanks,   -Vernona Rieger

## 2016-04-08 NOTE — Progress Notes (Signed)
Jay [name]  MRN: [id number] DOB: 08-05-1945  Subjective:  HPI   Patient is here for follow up. Patient was seen by Ricki Rodriguez PA yesterday and CMET and BNP ordered-BNP results not in yet. Chest Xray was ordered: He has diuresed 30 lbs on Metolazone. He is breathing better.No chest pain. He is living with his sons family. [place]. Wt Readings from Last 3 Encounters:  04/08/16 285 lb (129.3 kg)  04/08/16 285 lb 6.4 oz (129.5 kg)  04/07/16 287 lb (130.2 kg)   FINDINGS: There has been interval improvement in the appearance of the chest with decreased conspicuity of patchy alveolar opacities. The interstitial markings remain increased and confluent alveolar opacities remain visible bilaterally. There is a small amount of pleural fluid blunting the costophrenic angles. The cardiac silhouette remains enlarged. A coronary artery stent is visible. The mediastinum is normal in width. The bony thorax exhibits no acute abnormality.  IMPRESSION: CHF with decreased pulmonary interstitial and alveolar edema. Small bilateral pleural effusions blunt the costophrenic angles. Bilateral decubitus x-rays are reported separately.  Patient states he is feeling better but still feels fatigue/no energy. 30 lbs down from 03/18/16. Lab Results  Component Value Date   HGBA1C 12.9 11/14/2015    Patient Active Problem List   Diagnosis Date Noted  . Congestive heart failure (CHF) (HCC) 03/18/2016  . Bilateral carpal tunnel syndrome 11/20/2015  . Encephalopathy, hepatic (HCC) 07/26/2015  . Hyperammonemia (HCC) 07/25/2015  . Type 2 diabetes mellitus with hyperglycemia (HCC) 07/25/2015  . Seizure (HCC)   . Uncontrolled type 2 diabetes mellitus with ketoacidotic coma, with long-term current use of insulin (HCC)   . Stroke (HCC) 07/22/2015  . Respiratory failure (HCC)   . Acute encephalopathy   . Unstable angina (HCC) 06/24/2015  . Wound infection  08/17/2014  . Acute kidney failure (HCC) 05/23/2014  . Actinic keratosis 05/23/2014  . Absolute anemia 05/23/2014  . CAD in native artery 05/23/2014  . Depression, major, recurrent (HCC) 05/23/2014  . Diabetic retinopathy (HCC) 05/23/2014  . Abnormal LFTs 05/23/2014  . Fatty infiltration of liver 05/23/2014  . Acid reflux 05/23/2014  . BP (high blood pressure) 05/23/2014  . HLD (hyperlipidemia) 05/23/2014  . Anemia, iron deficiency 05/23/2014  . Metal bone fixation hardware in place 05/23/2014  . Adiposity 05/23/2014  . Obstructive apnea 05/23/2014  . Plaque psoriasis 05/23/2014  . 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 AF (paroxysmal atrial fibrillation) (HCC) 03/01/2014  . Aortic heart valve narrowing 01/05/2014  . Chronic kidney disease (CKD), stage IV (severe) (HCC) 01/05/2014  . Benign essential HTN 01/05/2014    Past Medical History:  Diagnosis Date  . Anemia   . Angina pectoris (HCC)   . Bilateral diabetic retinopathy (HCC)   . CAD (coronary artery disease)   . Diabetes mellitus without complication (HCC)   . Fatty liver   . Foot deformities, congenital   . GERD (gastroesophageal reflux disease)   . Hyperlipidemia   . Hypertension   . Morbid obesity (HCC)   . Pedal edema   . Sinoatrial node dysfunction (HCC)   . Sleep apnea    not tolerating CPAP    Social History   Social History  . Marital status: Married    Spouse name: N/A  . Number of children: N/A  . Years of education: N/A   Occupational History  . Not on file.   Social History Main Topics  . Smoking status: Never Smoker  .  Smokeless tobacco: Never Used  . Alcohol use 0.0 oz/week     Comment: seldomly- 1 beer  . Drug use: No  . Sexual activity: No   Other Topics Concern  . Not on file   Social History Narrative  . No narrative on file    Outpatient Encounter Prescriptions as of 04/08/2016  Medication Sig  . aspirin 81 MG tablet Take 1 tablet by mouth daily.  . carvedilol (COREG) 3.125 MG  tablet Take 3.125 mg by mouth daily.  Marland Kitchen donepezil (ARICEPT) 5 MG tablet Take 1 tablet (5 mg total) by mouth at bedtime.  . furosemide (LASIX) 40 MG tablet Take 1 tablet (40 mg total) by mouth daily. (Patient taking differently: Take 40 mg by mouth 2 (two) times daily. )  . insulin detemir (LEVEMIR) 100 UNIT/ML injection Inject 0.35 mLs (35 Units total) into the skin 2 (two) times daily.  Marland Kitchen lacosamide (VIMPAT) 50 MG TABS tablet Take 1 tablet (50 mg total) by mouth 2 (two) times daily.  . meclizine (ANTIVERT) 25 MG tablet TAKE 1 TABLET BY MOUTH 3 TIMES DAILY AS NEEDED FOR DIZZINESS  . mometasone (ELOCON) 0.1 % cream Apply 1 application topically daily. For 2 weeks  . ondansetron (ZOFRAN ODT) 8 MG disintegrating tablet Take 1 tablet (8 mg total) by mouth every 8 (eight) hours as needed for nausea or vomiting.  . potassium chloride (K-DUR) 10 MEQ tablet Take 1 tablet (10 mEq total) by mouth 2 (two) times daily.  . sertraline (ZOLOFT) 50 MG tablet TAKE 1 TABLET BY MOUTH ONCE DAILY  . [DISCONTINUED] lactulose (CHRONULAC) 10 GM/15ML solution Take 30 mLs (20 g total) by mouth 2 (two) times daily. (Patient not taking: Reported on 04/08/2016)   No facility-administered encounter medications on file as of 04/08/2016.     Allergies  Allergen Reactions  . Metformin Nausea Only    Review of Systems  Constitutional: Positive for malaise/fatigue and weight loss.  Respiratory: Positive for shortness of breath.   Cardiovascular: Positive for leg swelling.  Musculoskeletal:       Some joint swelling  Neurological: Positive for weakness.    Objective:  BP 132/76   Pulse 68   Temp 97.1 F (36.2 C)   Resp 20   Wt 285 lb (129.3 kg)   SpO2 93%   BMI 40.89 kg/m   Physical Exam  Constitutional: He is well-developed, well-nourished, and in no distress.  HENT:  Head: Normocephalic and atraumatic.  Eyes: Conjunctivae are normal. Pupils are equal, round, and reactive to light.  Cardiovascular: Normal rate,  regular rhythm and intact distal pulses.   Murmur (low pitch II/VI murmur) heard. Pulmonary/Chest: Effort normal and breath sounds normal. No respiratory distress. He has no wheezes.  Abdominal: Soft. There is no tenderness.  Musculoskeletal: He exhibits edema (2+).    Assessment and Plan :  1. Chronic congestive heart failure, unspecified congestive heart failure type (HCC) Better/stable. Check CBC, TSH and metC on the next visit. 2. Uncontrolled type 2 diabetes mellitus with ketoacidotic coma, with long-term current use of insulin (HCC) A1C 9.2, much better. Follow for now. No medication changes at this time.  3. Benign essential HTN Stable.  4. Edema, unspecified type Better. Go back on Zaroxolyn at 1 tablet daily for now and re check in 1 month.  5. Influenza vaccination declined  6. Pneumococcal vaccination declined  7. MCI (mild cognitive impairment) Worsening. Advised patient not to drive at night time. Consider adding Namenda on the next visit. MMSE  on the next visit. 6CIT score today was 15. I think pt may be encephalopathic--chronic--may need referral back to Neurology.  HPI, Exam and A&P transcribed under direction and in the presence of Julieanne Manson, MD. I have done the exam and reviewed the chart and it is accurate to the best of my knowledge. Dentist has been used and  any errors in dictation or transcription are unintentional. Julieanne Manson M.D. [facility]

## 2016-04-08 NOTE — Patient Instructions (Signed)

## 2016-04-09 ENCOUNTER — Ambulatory Visit (INDEPENDENT_AMBULATORY_CARE_PROVIDER_SITE_OTHER): Payer: PPO | Admitting: Family Medicine

## 2016-04-09 VITALS — BP 108/62 | HR 80 | Temp 97.6°F | Resp 20 | Wt 285.0 lb

## 2016-04-09 DIAGNOSIS — G3184 Mild cognitive impairment, so stated: Secondary | ICD-10-CM

## 2016-04-09 DIAGNOSIS — R609 Edema, unspecified: Secondary | ICD-10-CM | POA: Diagnosis not present

## 2016-04-09 NOTE — Progress Notes (Signed)
Jay [name]  MRN: [id number] DOB: April 15, 1945  Subjective:  HPI  Patient walked in today thinking he was suppose to have come back today. Patient states he feels confused, "numb in the head." SOB about the same. Does not feel right. Weakness gradually worsening. Patient Active Problem List   Diagnosis Date Noted  . Congestive heart failure (CHF) (HCC) 03/18/2016  . Bilateral carpal tunnel syndrome 11/20/2015  . Encephalopathy, hepatic (HCC) 07/26/2015  . Hyperammonemia (HCC) 07/25/2015  . Type 2 diabetes mellitus with hyperglycemia (HCC) 07/25/2015  . Seizure (HCC)   . Uncontrolled type 2 diabetes mellitus with ketoacidotic coma, with long-term current use of insulin (HCC)   . Stroke (HCC) 07/22/2015  . Respiratory failure (HCC)   . Acute encephalopathy   . Unstable angina (HCC) 06/24/2015  . Wound infection 08/17/2014  . Acute kidney failure (HCC) 05/23/2014  . Actinic keratosis 05/23/2014  . Absolute anemia 05/23/2014  . CAD in native artery 05/23/2014  . Depression, major, recurrent (HCC) 05/23/2014  . Diabetic retinopathy (HCC) 05/23/2014  . Abnormal LFTs 05/23/2014  . Fatty infiltration of liver 05/23/2014  . Acid reflux 05/23/2014  . BP (high blood pressure) 05/23/2014  . HLD (hyperlipidemia) 05/23/2014  . Anemia, iron deficiency 05/23/2014  . Metal bone fixation hardware in place 05/23/2014  . Adiposity 05/23/2014  . Obstructive apnea 05/23/2014  . Plaque psoriasis 05/23/2014  . 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 AF (paroxysmal atrial fibrillation) (HCC) 03/01/2014  . Aortic heart valve narrowing 01/05/2014  . Chronic kidney disease (CKD), stage IV (severe) (HCC) 01/05/2014  . Benign essential HTN 01/05/2014    Past Medical History:  Diagnosis Date  . Anemia   . Angina pectoris (HCC)   . Bilateral diabetic retinopathy (HCC)   . CAD (coronary artery disease)   . Diabetes mellitus without complication (HCC)   . Fatty liver   . Foot deformities,  congenital   . GERD (gastroesophageal reflux disease)   . Hyperlipidemia   . Hypertension   . Morbid obesity (HCC)   . Pedal edema   . Sinoatrial node dysfunction (HCC)   . Sleep apnea    not tolerating CPAP    Social History   Social History  . Marital status: Married    Spouse name: N/A  . Number of children: N/A  . Years of education: N/A   Occupational History  . Not on file.   Social History Main Topics  . Smoking status: Never Smoker  . Smokeless tobacco: Never Used  . Alcohol use 0.0 oz/week     Comment: seldomly- 1 beer  . Drug use: No  . Sexual activity: No   Other Topics Concern  . Not on file   Social History Narrative  . No narrative on file    Outpatient Encounter Prescriptions as of 04/09/2016  Medication Sig Note  . aspirin 81 MG tablet Take 1 tablet by mouth daily.   . carvedilol (COREG) 3.125 MG tablet Take 3.125 mg by mouth daily.   Marland Kitchen donepezil (ARICEPT) 5 MG tablet Take 1 tablet (5 mg total) by mouth at bedtime.   . furosemide (LASIX) 40 MG tablet Take 1 tablet (40 mg total) by mouth daily. (Patient taking differently: Take 40 mg by mouth 2 (two) times daily. )   . insulin detemir (LEVEMIR) 100 UNIT/ML injection Inject 0.35 mLs (35 Units total) into the skin 2 (two) times daily.   Marland Kitchen lacosamide (VIMPAT) 50 MG TABS tablet Take 1 tablet (50 mg total) by  mouth 2 (two) times daily.   . meclizine (ANTIVERT) 25 MG tablet TAKE 1 TABLET BY MOUTH 3 TIMES DAILY AS NEEDED FOR DIZZINESS   . mometasone (ELOCON) 0.1 % cream Apply 1 application topically daily. For 2 weeks   . ondansetron (ZOFRAN ODT) 8 MG disintegrating tablet Take 1 tablet (8 mg total) by mouth every 8 (eight) hours as needed for nausea or vomiting.   . potassium chloride (K-DUR) 10 MEQ tablet Take 1 tablet (10 mEq total) by mouth 2 (two) times daily.   . sertraline (ZOLOFT) 50 MG tablet TAKE 1 TABLET BY MOUTH ONCE DAILY   . [DISCONTINUED] metolazone (ZAROXOLYN) 2.5 MG tablet Take 1 tablet (2.5 mg  total) by mouth daily. 04/09/2016: memory confusion possibly   No facility-administered encounter medications on file as of 04/09/2016.     Allergies  Allergen Reactions  . Metformin Nausea Only    Review of Systems  Constitutional: Positive for malaise/fatigue.  Respiratory: Positive for shortness of breath.   Cardiovascular: Positive for leg swelling.  Neurological: Positive for weakness.  Psychiatric/Behavioral: Positive for memory loss (confusion).    Objective:  BP 108/62   Pulse 80   Temp 97.6 F (36.4 C)   Resp 20   Wt 285 lb (129.3 kg)   SpO2 95%   BMI 40.89 kg/m   Physical Exam  Constitutional: He is oriented to person, place, and time and well-developed, well-nourished, and in no distress.  HENT:  Head: Normocephalic and atraumatic.  Eyes: Conjunctivae are normal. No scleral icterus.  Neck: No thyromegaly present.  Cardiovascular: Normal rate, regular rhythm and normal heart sounds.   Pulmonary/Chest: Effort normal and breath sounds normal.  Abdominal: Soft.  Neurological: He is alert and oriented to person, place, and time.  Skin: Skin is warm and dry.  Psychiatric: Mood, memory, affect and judgment normal.    Assessment and Plan :  1. Edema, unspecified type Stable.  2. MCI (mild cognitive impairment) Seems to be worse today. Will stop Zaroxolyn and see if thinking improves. Patient feels that this medication is affecting him. Will re check in 3 weeks. Concern for encephalopathy continues.  HPI, Exam and A&P transcribed under direction and in the presence of Julieanne Manson, MD. I have done the exam and reviewed the chart and it is accurate to the best of my knowledge. Dentist has been used and  any errors in dictation or transcription are unintentional. Julieanne Manson M.D. [facility]

## 2016-04-17 DIAGNOSIS — I5021 Acute systolic (congestive) heart failure: Secondary | ICD-10-CM | POA: Diagnosis not present

## 2016-04-22 ENCOUNTER — Ambulatory Visit: Payer: PPO | Admitting: Family Medicine

## 2016-04-23 DIAGNOSIS — I2510 Atherosclerotic heart disease of native coronary artery without angina pectoris: Secondary | ICD-10-CM | POA: Diagnosis not present

## 2016-04-23 DIAGNOSIS — I5032 Chronic diastolic (congestive) heart failure: Secondary | ICD-10-CM | POA: Diagnosis not present

## 2016-04-23 DIAGNOSIS — I350 Nonrheumatic aortic (valve) stenosis: Secondary | ICD-10-CM | POA: Diagnosis not present

## 2016-04-28 ENCOUNTER — Ambulatory Visit: Payer: PPO | Admitting: Family Medicine

## 2016-05-01 ENCOUNTER — Ambulatory Visit (INDEPENDENT_AMBULATORY_CARE_PROVIDER_SITE_OTHER): Payer: PPO | Admitting: Physician Assistant

## 2016-05-01 ENCOUNTER — Inpatient Hospital Stay
Admission: EM | Admit: 2016-05-01 | Discharge: 2016-05-05 | DRG: 292 | Disposition: A | Discharge status: Home / Self Care | Payer: PPO | Attending: Internal Medicine | Admitting: Internal Medicine

## 2016-05-01 ENCOUNTER — Encounter: Payer: Self-pay | Admitting: Emergency Medicine

## 2016-05-01 ENCOUNTER — Emergency Department: Payer: PPO

## 2016-05-01 VITALS — BP 110/54 | Temp 97.9°F | Wt 321.0 lb

## 2016-05-01 DIAGNOSIS — G4730 Sleep apnea, unspecified: Secondary | ICD-10-CM | POA: Diagnosis not present

## 2016-05-01 DIAGNOSIS — H6123 Impacted cerumen, bilateral: Secondary | ICD-10-CM | POA: Diagnosis not present

## 2016-05-01 DIAGNOSIS — I5023 Acute on chronic systolic (congestive) heart failure: Secondary | ICD-10-CM

## 2016-05-01 DIAGNOSIS — H9193 Unspecified hearing loss, bilateral: Secondary | ICD-10-CM | POA: Diagnosis not present

## 2016-05-01 DIAGNOSIS — I2510 Atherosclerotic heart disease of native coronary artery without angina pectoris: Secondary | ICD-10-CM | POA: Diagnosis not present

## 2016-05-01 DIAGNOSIS — H9190 Unspecified hearing loss, unspecified ear: Secondary | ICD-10-CM | POA: Diagnosis present

## 2016-05-01 DIAGNOSIS — Z888 Allergy status to other drugs, medicaments and biological substances status: Secondary | ICD-10-CM

## 2016-05-01 DIAGNOSIS — Z955 Presence of coronary angioplasty implant and graft: Secondary | ICD-10-CM

## 2016-05-01 DIAGNOSIS — J9601 Acute respiratory failure with hypoxia: Secondary | ICD-10-CM

## 2016-05-01 DIAGNOSIS — Z794 Long term (current) use of insulin: Secondary | ICD-10-CM | POA: Diagnosis not present

## 2016-05-01 DIAGNOSIS — I509 Heart failure, unspecified: Secondary | ICD-10-CM

## 2016-05-01 DIAGNOSIS — I110 Hypertensive heart disease with heart failure: Principal | ICD-10-CM | POA: Diagnosis present

## 2016-05-01 DIAGNOSIS — Z79899 Other long term (current) drug therapy: Secondary | ICD-10-CM | POA: Diagnosis not present

## 2016-05-01 DIAGNOSIS — E119 Type 2 diabetes mellitus without complications: Secondary | ICD-10-CM | POA: Diagnosis not present

## 2016-05-01 DIAGNOSIS — I440 Atrioventricular block, first degree: Secondary | ICD-10-CM

## 2016-05-01 DIAGNOSIS — K219 Gastro-esophageal reflux disease without esophagitis: Secondary | ICD-10-CM | POA: Diagnosis not present

## 2016-05-01 DIAGNOSIS — R0602 Shortness of breath: Secondary | ICD-10-CM | POA: Diagnosis not present

## 2016-05-01 DIAGNOSIS — E785 Hyperlipidemia, unspecified: Secondary | ICD-10-CM | POA: Diagnosis not present

## 2016-05-01 DIAGNOSIS — Z6841 Body Mass Index (BMI) 40.0 and over, adult: Secondary | ICD-10-CM

## 2016-05-01 DIAGNOSIS — Z7982 Long term (current) use of aspirin: Secondary | ICD-10-CM

## 2016-05-01 HISTORY — DX: Heart failure, unspecified: I50.9

## 2016-05-01 LAB — BASIC METABOLIC PANEL WITH GFR
Anion gap: 6 (ref 5–15)
BUN: 28 mg/dL — ABNORMAL HIGH (ref 6–20)
CO2: 29 mmol/L (ref 22–32)
Calcium: 8.5 mg/dL — ABNORMAL LOW (ref 8.9–10.3)
Chloride: 100 mmol/L — ABNORMAL LOW (ref 101–111)
Creatinine, Ser: 0.84 mg/dL (ref 0.61–1.24)
GFR calc Af Amer: >60 mL/min
GFR calc non Af Amer: >60 mL/min
Glucose, Bld: 272 mg/dL — ABNORMAL HIGH (ref 65–99)
Potassium: 4.6 mmol/L (ref 3.5–5.1)
Sodium: 135 mmol/L (ref 135–145)

## 2016-05-01 LAB — CBC
HEMATOCRIT: 31.3 % — AB (ref 40.0–52.0)
HEMOGLOBIN: 10.5 g/dL — AB (ref 13.0–18.0)
MCH: 29 pg (ref 26.0–34.0)
MCHC: 33.7 g/dL (ref 32.0–36.0)
MCV: 86 fL (ref 80.0–100.0)
Platelets: 228 10*3/uL (ref 150–440)
RBC: 3.63 MIL/uL — AB (ref 4.40–5.90)
RDW: 16.3 % — ABNORMAL HIGH (ref 11.5–14.5)
WBC: 5.9 10*3/uL (ref 3.8–10.6)

## 2016-05-01 LAB — GLUCOSE, CAPILLARY: Glucose-Capillary: 236 mg/dL — ABNORMAL HIGH (ref 65–99)

## 2016-05-01 LAB — BRAIN NATRIURETIC PEPTIDE: B Natriuretic Peptide: 636 pg/mL — ABNORMAL HIGH (ref 0.0–100.0)

## 2016-05-01 LAB — MRSA PCR SCREENING: MRSA by PCR: NEGATIVE

## 2016-05-01 LAB — TROPONIN I
Troponin I: 0.05 ng/mL (ref <0.03)
Troponin I: 0.08 ng/mL (ref <0.03)
Troponin I: 0.06 ng/mL (ref <0.03)

## 2016-05-01 MED ORDER — ASPIRIN EC 81 MG PO TBEC
81.0000 mg | DELAYED_RELEASE_TABLET | Freq: Every day | ORAL | Status: DC
  Administered 2016-05-02 – 2016-05-05 (×4): 81 mg via ORAL
  Filled 2016-05-01 (×4): qty 1

## 2016-05-01 MED ORDER — METOLAZONE 2.5 MG PO TABS
2.5000 mg | ORAL_TABLET | Freq: Two times a day (BID) | ORAL | Status: DC
  Administered 2016-05-01 – 2016-05-05 (×8): 2.5 mg via ORAL
  Filled 2016-05-01 (×8): qty 1

## 2016-05-01 MED ORDER — CARVEDILOL 3.125 MG PO TABS
3.1250 mg | ORAL_TABLET | Freq: Two times a day (BID) | ORAL | Status: DC
  Administered 2016-05-01 – 2016-05-05 (×8): 3.125 mg via ORAL
  Filled 2016-05-01 (×8): qty 1

## 2016-05-01 MED ORDER — DONEPEZIL HCL 5 MG PO TABS
5.0000 mg | ORAL_TABLET | Freq: Every day | ORAL | Status: DC
  Administered 2016-05-01 – 2016-05-04 (×4): 5 mg via ORAL
  Filled 2016-05-01 (×4): qty 1

## 2016-05-01 MED ORDER — NITROGLYCERIN 0.4 MG SL SUBL: 0.4000 mg | SUBLINGUAL_TABLET | SUBLINGUAL | Status: DC | PRN

## 2016-05-01 MED ORDER — AMLODIPINE BESYLATE 5 MG PO TABS
5.0000 mg | ORAL_TABLET | Freq: Every day | ORAL | Status: DC
  Administered 2016-05-02 – 2016-05-05 (×4): 5 mg via ORAL
  Filled 2016-05-01 (×4): qty 1

## 2016-05-01 MED ORDER — INSULIN DETEMIR 100 UNIT/ML ~~LOC~~ SOLN
35.0000 [IU] | Freq: Two times a day (BID) | SUBCUTANEOUS | Status: DC
  Administered 2016-05-01 – 2016-05-04 (×6): 35 [IU] via SUBCUTANEOUS
  Filled 2016-05-01 (×7): qty 0.35

## 2016-05-01 MED ORDER — DOCUSATE SODIUM 100 MG PO CAPS: 100.0000 mg | ORAL_CAPSULE | Freq: Two times a day (BID) | ORAL | Status: DC | PRN

## 2016-05-01 MED ORDER — MECLIZINE HCL 25 MG PO TABS
12.5000 mg | ORAL_TABLET | Freq: Three times a day (TID) | ORAL | Status: DC | PRN
Start: 2016-05-01 — End: 2016-05-05
  Administered 2016-05-04: 12.5 mg via ORAL
  Filled 2016-05-01: qty 1

## 2016-05-01 MED ORDER — ONDANSETRON 8 MG PO TBDP
8.0000 mg | ORAL_TABLET | Freq: Three times a day (TID) | ORAL | Status: DC | PRN
  Filled 2016-05-01: qty 1

## 2016-05-01 MED ORDER — FUROSEMIDE 10 MG/ML IJ SOLN
40.0000 mg | Freq: Two times a day (BID) | INTRAMUSCULAR | Status: DC
  Administered 2016-05-02: 40 mg via INTRAVENOUS
  Filled 2016-05-01: qty 4

## 2016-05-01 MED ORDER — HEPARIN SODIUM (PORCINE) 5000 UNIT/ML IJ SOLN
5000.0000 [IU] | Freq: Three times a day (TID) | INTRAMUSCULAR | Status: DC
  Administered 2016-05-01 – 2016-05-04 (×9): 5000 [IU] via SUBCUTANEOUS
  Filled 2016-05-01 (×9): qty 1

## 2016-05-01 MED ORDER — INSULIN ASPART 100 UNIT/ML ~~LOC~~ SOLN
0.0000 [IU] | Freq: Three times a day (TID) | SUBCUTANEOUS | Status: DC
  Administered 2016-05-02: 2 [IU] via SUBCUTANEOUS
  Administered 2016-05-02: 3 [IU] via SUBCUTANEOUS
  Administered 2016-05-03: 1 [IU] via SUBCUTANEOUS
  Administered 2016-05-03 – 2016-05-04 (×2): 2 [IU] via SUBCUTANEOUS
  Administered 2016-05-04: 5 [IU] via SUBCUTANEOUS
  Administered 2016-05-04: 3 [IU] via SUBCUTANEOUS
  Administered 2016-05-05: 5 [IU] via SUBCUTANEOUS
  Administered 2016-05-05: 2 [IU] via SUBCUTANEOUS
  Administered 2016-05-05: 3 [IU] via SUBCUTANEOUS
  Filled 2016-05-01 (×3): qty 2
  Filled 2016-05-01: qty 5
  Filled 2016-05-01: qty 3
  Filled 2016-05-01: qty 5
  Filled 2016-05-01 (×2): qty 3
  Filled 2016-05-01: qty 2
  Filled 2016-05-01 (×2): qty 1

## 2016-05-01 MED ORDER — CARBAMIDE PEROXIDE 6.5 % OT SOLN
5.0000 [drp] | Freq: Two times a day (BID) | OTIC | Status: DC
  Administered 2016-05-01: 5 [drp] via OTIC
  Filled 2016-05-01: qty 15

## 2016-05-01 MED ORDER — SODIUM CHLORIDE 0.9% FLUSH
3.0000 mL | Freq: Two times a day (BID) | INTRAVENOUS | Status: DC
  Administered 2016-05-01 – 2016-05-05 (×7): 3 mL via INTRAVENOUS

## 2016-05-01 MED ORDER — LACOSAMIDE 50 MG PO TABS
50.0000 mg | ORAL_TABLET | Freq: Two times a day (BID) | ORAL | Status: DC
  Administered 2016-05-02 – 2016-05-05 (×7): 50 mg via ORAL
  Filled 2016-05-01 (×7): qty 1

## 2016-05-01 MED ORDER — SERTRALINE HCL 50 MG PO TABS
50.0000 mg | ORAL_TABLET | Freq: Every day | ORAL | Status: DC
  Administered 2016-05-02 – 2016-05-05 (×4): 50 mg via ORAL
  Filled 2016-05-01 (×5): qty 1

## 2016-05-01 MED ORDER — FUROSEMIDE 10 MG/ML IJ SOLN
80.0000 mg | Freq: Once | INTRAMUSCULAR | Status: AC
  Administered 2016-05-01: 80 mg via INTRAVENOUS
  Filled 2016-05-01: qty 8

## 2016-05-01 NOTE — Progress Notes (Signed)
Patient: Jay [name] Male    DOB: Apr 17, 1945   71 y.o.   MRN: [id number] Visit Date: 05/01/2016  Today's Provider: Margaretann Loveless, PA-C   No chief complaint on file.  Subjective:    HPI Jay [name] is a 71 yr old male that walked in to the office today complaining of worsening SOB at rest and with exertion, worsening edema, and hearing loss. He has heart failure and has been having more difficulty getting relief with oral diuresis. He has been hospitalized on 03/19/16 for IV lasix. At that time he had a significant weight change over the last 2-3 months previously. He has also had worsening confusion and has been referred to neurology for work up of encephalopathy. The confusion and dementia has been worsening quite rapidly.   When the patient walked in to the office his HR was in the 160s and the pulse ox would not read an O2 sat. He was sat down and O2 sat came up to 90-91%. He was audibly wheezing and having difficulty catching his breath. We took him to a room and started him on 2L O2 and oxygen increased to 94%. EKG was then obtained as well, but he did not have any chest pain. He does appear to have a new 1st degree A-V block not previously noted. There was no asymmetrical weakness or paralysis, dizziness, chest pain, visual changes.   He did give me verbal consent to call his son. His son stated he was concerned for his acute on chronic hearing loss. He reports that he has had worsening hearing loss over the last 2 days. He reports his dad otherwise tells him everything is ok.   When asked if the patient has been taking his medications as prescribed he just states "I think so." We checked his weight today as well and he has went from 285 pounds on 04/09/16 to 321 pounds today. He is not weighing himself at home. He is followed by Dr. Gwen Pounds. I did place a call to their office but it was closed today with this being a holiday (good Friday). I did place a call to Dr.  Sullivan Lone, PCP, who advised that he would best benefit from IV diuresis again. A call was placed to EMS to pick up the patient as it was not felt to be safe for him to drive himself to the hospital.     Allergies  Allergen Reactions  . Metformin Nausea Only     Current Outpatient Prescriptions:  .  aspirin 81 MG tablet, Take 1 tablet by mouth daily., Disp: , Rfl:  .  carvedilol (COREG) 3.125 MG tablet, Take 3.125 mg by mouth daily., Disp: , Rfl:  .  donepezil (ARICEPT) 5 MG tablet, Take 1 tablet (5 mg total) by mouth at bedtime., Disp: 30 tablet, Rfl: 12 .  furosemide (LASIX) 40 MG tablet, Take 1 tablet (40 mg total) by mouth daily. (Patient taking differently: Take 40 mg by mouth 2 (two) times daily. ), Disp: 60 tablet, Rfl: 0 .  insulin detemir (LEVEMIR) 100 UNIT/ML injection, Inject 0.35 mLs (35 Units total) into the skin 2 (two) times daily., Disp: 10 mL, Rfl: 11 .  lacosamide (VIMPAT) 50 MG TABS tablet, Take 1 tablet (50 mg total) by mouth 2 (two) times daily., Disp: 60 tablet, Rfl: 2 .  meclizine (ANTIVERT) 25 MG tablet, TAKE 1 TABLET BY MOUTH 3 TIMES DAILY AS NEEDED FOR DIZZINESS, Disp: 90 tablet, Rfl: 5 .  mometasone (ELOCON) 0.1 % cream, Apply 1 application topically daily. For 2 weeks, Disp: , Rfl:  .  ondansetron (ZOFRAN ODT) 8 MG disintegrating tablet, Take 1 tablet (8 mg total) by mouth every 8 (eight) hours as needed for nausea or vomiting., Disp: 20 tablet, Rfl: 0 .  potassium chloride (K-DUR) 10 MEQ tablet, Take 1 tablet (10 mEq total) by mouth 2 (two) times daily., Disp: 180 tablet, Rfl: 0 .  sertraline (ZOLOFT) 50 MG tablet, TAKE 1 TABLET BY MOUTH ONCE DAILY, Disp: 30 tablet, Rfl: 11  Review of Systems  Constitutional: Positive for fatigue and fever.  HENT: Negative.   Respiratory: Positive for chest tightness, shortness of breath and wheezing. Negative for cough.   Cardiovascular: Positive for leg swelling.  Gastrointestinal: Positive for abdominal distention.    Neurological: Positive for weakness.    Social History  Substance Use Topics  . Smoking status: Never Smoker  . Smokeless tobacco: Never Used  . Alcohol use 0.0 oz/week     Comment: seldomly- 1 beer   Objective:   BP (!) 110/54 (BP Location: Right Arm, Patient Position: Sitting, Cuff Size: Large)   Temp 97.9 F (36.6 C) (Oral)   Wt (!) 321 lb (145.6 kg)   BMI 46.06 kg/m  Vitals:   05/01/16 1316  BP: (!) 110/54  Temp: 97.9 F (36.6 C)  TempSrc: Oral  Weight: (!) 321 lb (145.6 kg)     Physical Exam  Constitutional: He appears well-developed and well-nourished. No distress.  HENT:  Head: Normocephalic and atraumatic.  Right Ear: External ear normal. Decreased hearing is noted.  Left Ear: External ear normal. Decreased hearing is noted.  Nose: Nose normal.  Mouth/Throat: Uvula is midline, oropharynx is clear and moist and mucous membranes are normal.  Cerumen impaction noted bilaterally unable to perform lavage due to acuity and sent to ER.  Neck: Normal range of motion. Neck supple. No JVD present. No tracheal deviation present. No thyromegaly present.  Cardiovascular: Normal rate, regular rhythm and normal heart sounds.  Exam reveals no gallop and no friction rub.   No murmur heard. Pulmonary/Chest: Effort normal. No respiratory distress. He has decreased breath sounds (gurgling type sound heard occasionally with auscultation). He has no wheezes. He has no rhonchi. He has no rales.  Abdominal: He exhibits distension. There is no tenderness.  Musculoskeletal: He exhibits edema (2-3+ pitting edema bilaterally).  Lymphadenopathy:    He has no cervical adenopathy.  Skin: He is not diaphoretic.  Vitals reviewed.      Assessment & Plan:     1. Chronic congestive heart failure, unspecified congestive heart failure type (HCC) EKG showed new onset, or not previously noted, 1st degree AV block with rate of 69, flattened Twaves, nonspecific. Patient was sent to ER via EMS due  to SOB and significant weight gain for him to have IV diuresis and further eval if needed.  - Electrocardiogram report  2. Bilateral impacted cerumen Possible cause of acute worsening hearing loss. Unable to lavage today due to acuity. May need recheck when he follows up to see if still needs to be completed.   3. Bilateral hearing loss, unspecified hearing loss type Worsening.   4. 1st degree AV block New onset on EKG today.        Margaretann Loveless, PA-C  [facility]

## 2016-05-01 NOTE — Progress Notes (Signed)
Patient just arrived to room. VSS. Ambulated to bathroom independently. Tele box verified with Crystal RN [id number]. Call bell within reach. Change of shift handoff to [facility].

## 2016-05-01 NOTE — ED Provider Notes (Signed)
[facility] Emergency Department Provider Note  ____________________________________________  Time seen: Approximately 4:14 PM  I have reviewed the triage vital signs and the nursing notes.   HISTORY  Chief Complaint Leg Swelling; Shortness of Breath; and Hearing Loss   HPI Jay [name] is a 71 y.o. male with a history of coronary artery disease, morbidly obesity, hypertension, hyperlipidemia, CHF (EF of 55-60% on 6/17) who presents from cardiology clinic for acute CHF exacerbation. Patient has been having progressively worsening swelling of his bilateral lower extremities and shortness of breath for one week. He tells me that he has gained 30 pounds over the course of the last week. His been taking his Lasix at home. He endorses shortness of breath with ambulation but none at rest although he looks dyspneic on exam. He denies chest pain. He sleeps on 2 pillows and that has not changed. He denies orthopnea. Also endorses swelling of his abdomen. He denies fever, chills, URI symptoms. He endorses normal urine output. He went to his cardiologist today and was found to be hypoxic on room air. He was placed on 2 L nasal cannula and sent to the ED for evaluation.  Past Medical History:  Diagnosis Date  . Anemia   . Angina pectoris (HCC)   . Bilateral diabetic retinopathy (HCC)   . CAD (coronary artery disease)   . Diabetes mellitus without complication (HCC)   . Fatty liver   . Foot deformities, congenital   . GERD (gastroesophageal reflux disease)   . Hyperlipidemia   . Hypertension   . Morbid obesity (HCC)   . Pedal edema   . Sinoatrial node dysfunction (HCC)   . Sleep apnea    not tolerating CPAP    Patient Active Problem List   Diagnosis Date Noted  . Congestive heart failure (CHF) (HCC) 03/18/2016  . Bilateral carpal tunnel syndrome 11/20/2015  . Encephalopathy, hepatic (HCC) 07/26/2015  . Hyperammonemia (HCC) 07/25/2015  . Type 2 diabetes  mellitus with hyperglycemia (HCC) 07/25/2015  . Seizure (HCC)   . Uncontrolled type 2 diabetes mellitus with ketoacidotic coma, with long-term current use of insulin (HCC)   . Stroke (HCC) 07/22/2015  . Respiratory failure (HCC)   . Acute encephalopathy   . Unstable angina (HCC) 06/24/2015  . Wound infection 08/17/2014  . Acute kidney failure (HCC) 05/23/2014  . Actinic keratosis 05/23/2014  . Absolute anemia 05/23/2014  . CAD in native artery 05/23/2014  . Depression, major, recurrent (HCC) 05/23/2014  . Diabetic retinopathy (HCC) 05/23/2014  . Abnormal LFTs 05/23/2014  . Fatty infiltration of liver 05/23/2014  . Acid reflux 05/23/2014  . BP (high blood pressure) 05/23/2014  . HLD (hyperlipidemia) 05/23/2014  . Anemia, iron deficiency 05/23/2014  . Metal bone fixation hardware in place 05/23/2014  . Adiposity 05/23/2014  . Obstructive apnea 05/23/2014  . Plaque psoriasis 05/23/2014  . 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 AF (paroxysmal atrial fibrillation) (HCC) 03/01/2014  . Aortic heart valve narrowing 01/05/2014  . Chronic kidney disease (CKD), stage IV (severe) (HCC) 01/05/2014  . Benign essential HTN 01/05/2014    Past Surgical History:  Procedure Laterality Date  . CARDIAC CATHETERIZATION Left 07/10/2015   Procedure: Coronary Angiography;  Surgeon: Lamar Blinks, MD;  Location: [facility] INVASIVE CV LAB;  Service: Cardiovascular;  Laterality: Left;  . CORONARY ANGIOPLASTY WITH STENT PLACEMENT  2006 and 2008    Prior to Admission medications   Medication Sig Start Date End Date Taking? Authorizing Provider  aspirin 81 MG  tablet Take 1 tablet by mouth daily. 12/01/10   Historical Provider, MD  carvedilol (COREG) 3.125 MG tablet Take 3.125 mg by mouth daily.    Historical Provider, MD  donepezil (ARICEPT) 5 MG tablet Take 1 tablet (5 mg total) by mouth at bedtime. 10/08/15   Deandre Hulen Shouts., MD  furosemide (LASIX) 40 MG tablet Take 1 tablet (40 mg total) by mouth  daily. Patient taking differently: Take 40 mg by mouth 2 (two) times daily.  03/18/16   Qaadir Hulen Shouts., MD  insulin detemir (LEVEMIR) 100 UNIT/ML injection Inject 0.35 mLs (35 Units total) into the skin 2 (two) times daily. 08/19/15   Kassim Hulen Shouts., MD  lacosamide (VIMPAT) 50 MG TABS tablet Take 1 tablet (50 mg total) by mouth 2 (two) times daily. 08/08/15   Enid Baas, MD  meclizine (ANTIVERT) 25 MG tablet TAKE 1 TABLET BY MOUTH 3 TIMES DAILY AS NEEDED FOR DIZZINESS 08/08/15   Enid Baas, MD  mometasone (ELOCON) 0.1 % cream Apply 1 application topically daily. For 2 weeks 03/26/14   Historical Provider, MD  ondansetron (ZOFRAN ODT) 8 MG disintegrating tablet Take 1 tablet (8 mg total) by mouth every 8 (eight) hours as needed for nausea or vomiting. 10/31/15   Sharman Cheek, MD  potassium chloride (K-DUR) 10 MEQ tablet Take 1 tablet (10 mEq total) by mouth 2 (two) times daily. 04/07/16 07/06/16  Trey Sailors, PA-C  sertraline (ZOLOFT) 50 MG tablet TAKE 1 TABLET BY MOUTH ONCE DAILY 03/24/16   Maple Hudson., MD    Allergies Metformin  Family History  Problem Relation Age of Onset  . Leukemia Mother   . Cancer Father   . Deep vein thrombosis Father   . Psychiatric Illness Brother   . Heart disease Brother     Social History Social History  Substance Use Topics  . Smoking status: Never Smoker  . Smokeless tobacco: Never Used  . Alcohol use 0.0 oz/week     Comment: seldomly- 1 beer    Review of Systems  Constitutional: Negative for fever. Eyes: Negative for visual changes. ENT: Negative for sore throat. Neck: No neck pain  Cardiovascular: Negative for chest pain. Respiratory: + shortness of breath. Gastrointestinal: Negative for abdominal pain, vomiting or diarrhea. Genitourinary: Negative for dysuria. Musculoskeletal: Negative for back pain. + b/l LE edema Skin: Negative for rash. Neurological: Negative for headaches, weakness or  numbness. Psych: No SI or HI  ____________________________________________   PHYSICAL EXAM:  VITAL SIGNS: ED Triage Vitals  Enc Vitals Group     BP 05/01/16 1407 (!) 163/54     Pulse Rate 05/01/16 1407 64     Resp 05/01/16 1407 18     Temp 05/01/16 1407 97.6 F (36.4 C)     Temp Source 05/01/16 1407 Oral     SpO2 05/01/16 1407 96 %     Weight 05/01/16 1409 (!) 320 lb (145.2 kg)     Height 05/01/16 1409 5\' 9"  (1.753 m)     Head Circumference --      Peak Flow --      Pain Score --      Pain Loc --      Pain Edu? --      Excl. in GC? --     Constitutional: Alert and oriented, dyspneic, mild respiratory distress.  HEENT:      Head: Normocephalic and atraumatic.         Eyes: Conjunctivae are normal. Sclera  is non-icteric. EOMI. PERRL      Mouth/Throat: Mucous membranes are moist.       Neck: Supple with no signs of meningismus. Cardiovascular: Regular rate and rhythm. No murmurs, gallops, or rubs. 2+ symmetrical distal pulses are present in all extremities. No JVD. Respiratory: Mildly increased WOB, decrease air movement on b/l bases, faint wheezes and crackles. Gastrointestinal: Soft, non tender, and non distended with positive bowel sounds. No rebound or guarding. Pitting edema of the abdomen up to umbilicus.  Genitourinary: No CVA tenderness. Musculoskeletal: 3+ pitting edema of b/l LE Neurologic: Normal speech and language. Face is symmetric. Moving all extremities. No gross focal neurologic deficits are appreciated. Skin: Skin is warm, dry and intact. No rash noted. Psychiatric: Mood and affect are normal. Speech and behavior are normal.  ____________________________________________   LABS (all labs ordered are listed, but only abnormal results are displayed)  Labs Reviewed  BASIC METABOLIC PANEL - Abnormal; Notable for the following:       Result Value   Chloride 100 (*)    Glucose, Bld 272 (*)    BUN 28 (*)    Calcium 8.5 (*)    All other components within  normal limits  CBC - Abnormal; Notable for the following:    RBC 3.63 (*)    Hemoglobin 10.5 (*)    HCT 31.3 (*)    RDW 16.3 (*)    All other components within normal limits  TROPONIN I - Abnormal; Notable for the following:    Troponin I 0.05 (*)    All other components within normal limits  BRAIN NATRIURETIC PEPTIDE - Abnormal; Notable for the following:    B Natriuretic Peptide 636.0 (*)    All other components within normal limits   ____________________________________________  EKG  ED ECG REPORT I, Nita Sickle, the attending physician, personally viewed and interpreted this ECG.  Normal sinus rhythm, rate of 65, first-degree AV block, normal QRS and QTc intervals, normal axis, no ST elevations or depressions, low voltage QRS.  ____________________________________________  RADIOLOGY  CXR: Cardiomegaly. Mild interstitial prominence bilaterally suspicious for interstitial edema or pneumonitis. Mild basilar atelectasis. No segmental infiltrate. ____________________________________________   PROCEDURES  Procedure(s) performed: None Procedures Critical Care performed:  Yes  CRITICAL CARE Performed by: Nita Sickle  ?  Total critical care time: 35 min  Critical care time was exclusive of separately billable procedures and treating other patients.  Critical care was necessary to treat or prevent imminent or life-threatening deterioration.  Critical care was time spent personally by me on the following activities: development of treatment plan with patient and/or surrogate as well as nursing, discussions with consultants, evaluation of patient's response to treatment, examination of patient, obtaining history from patient or surrogate, ordering and performing treatments and interventions, ordering and review of laboratory studies, ordering and review of radiographic studies, pulse oximetry and re-evaluation of patient's  condition.  ____________________________________________   INITIAL IMPRESSION / ASSESSMENT AND PLAN / ED COURSE  71 y.o. male with a history of coronary artery disease, morbidly obesity, hypertension, hyperlipidemia, CHF (EF of 55-60% on 6/17) who presents from cardiology clinic for acute CHF exacerbation. Patient seen by Cardiology on 3/6 with weight of 287lbs, patient is now 320lbs in the ED with crackles, 3+ pitting edema and abdominal wall edema. CXR showing cardiomegaly with pulmonary edema. We'll give IV Lasix. EKG with no ischemic changes. Troponin mildly elevated at 0.05 from demand ischemia. BNP elevated at 636. Will admit to Hospitalist     Pertinent  labs & imaging results that were available during my care of the patient were reviewed by me and considered in my medical decision making (see chart for details).    ____________________________________________   FINAL CLINICAL IMPRESSION(S) / ED DIAGNOSES  Final diagnoses:  Acute on chronic congestive heart failure, unspecified congestive heart failure type (HCC)  Acute respiratory failure with hypoxia (HCC)      NEW MEDICATIONS STARTED DURING THIS VISIT:  New Prescriptions   No medications on file     Note:  This document was prepared using Dragon voice recognition software and may include unintentional dictation errors.    Nita Sickle, MD 05/01/16 [phone]

## 2016-05-01 NOTE — ED Notes (Signed)
Attempted to call Report to Loree Fee.

## 2016-05-01 NOTE — ED Triage Notes (Signed)
Patient states increased pedal edema and SOB x 1 week. Does not usually wear 02. Arrives from MD office via ambulance with 02 on. Patient states sensation os SOB at rest. Also states loss of hearing x 2 days, hears yelling.

## 2016-05-01 NOTE — ED Notes (Signed)
Patient given cup of ice okay by Admitting MD

## 2016-05-01 NOTE — H&P (Signed)
[employer] - [place] at [facility]   PATIENT NAME: Jay [name]    MR#:  [id number]  DATE OF BIRTH:  [birth year]  DATE OF ADMISSION:  05/01/2016  PRIMARY CARE PHYSICIAN: Megan Mans, MD   REQUESTING/REFERRING PHYSICIAN: Don Perking  CHIEF COMPLAINT:   Chief Complaint  Patient presents with  . Leg Swelling  . Shortness of Breath  . Hearing Loss    HISTORY OF PRESENT ILLNESS: Jay [name]  is a 71 y.o. male with a known history of Diabetic retinopathy, coronary artery disease, CHF, diabetes, fatty liver, hyperlipidemia, hypertension, morbid obesity, sleep apnea- went to his physician's office today because he could not hear properly, he said he was trying to have eardrops at home but it was not helping. When he went to physician's office today he also complained that for last few days to one week he also have gained 30-40 pound weight, and feels very short of breath on walking a few steps. Physician's office started him on oxygen and sent to emergency room for further evaluation. When I saw him he said he is not able to hear properly because of the Wax in his his ears and I had to communicate with him writing down on the paper. He confirms he drinks a lot of water and other sort liquids at home.  After I explained him about the fluid overload and heart failure and his requirement for admission- he again asked me " so what about my ears? , that is my main complaint."  PAST MEDICAL HISTORY:   Past Medical History:  Diagnosis Date  . Anemia   . Angina pectoris (HCC)   . Bilateral diabetic retinopathy (HCC)   . CAD (coronary artery disease)   . CHF (congestive heart failure) (HCC)   . Diabetes mellitus without complication (HCC)   . Fatty liver   . Foot deformities, congenital   . GERD (gastroesophageal reflux disease)   . Hyperlipidemia   . Hypertension   . Morbid obesity (HCC)   . Pedal edema   . Sinoatrial node dysfunction (HCC)   . Sleep apnea    not tolerating CPAP    PAST SURGICAL HISTORY: Past Surgical History:  Procedure Laterality Date  . CARDIAC CATHETERIZATION Left 07/10/2015   Procedure: Coronary Angiography;  Surgeon: Lamar Blinks, MD;  Location: [facility] INVASIVE CV LAB;  Service: Cardiovascular;  Laterality: Left;  . CORONARY ANGIOPLASTY WITH STENT PLACEMENT  2006 and 2008    SOCIAL HISTORY:  Social History  Substance Use Topics  . Smoking status: Never Smoker  . Smokeless tobacco: Never Used  . Alcohol use 0.0 oz/week     Comment: seldomly- 1 beer    FAMILY HISTORY:  Family History  Problem Relation Age of Onset  . Leukemia Mother   . Cancer Father   . Deep vein thrombosis Father   . Psychiatric Illness Brother   . Heart disease Brother     DRUG ALLERGIES:  Allergies  Allergen Reactions  . Metformin Nausea Only    REVIEW OF SYSTEMS:   CONSTITUTIONAL: No fever, fatigue or weakness.  EYES: No blurred or double vision.  EARS, NOSE, AND THROAT: No tinnitus or ear pain.  RESPIRATORY: No cough, shortness of breath, wheezing or hemoptysis.  CARDIOVASCULAR: No chest pain, orthopnea, or bilateral legs, thighs, abdomen edema.  GASTROINTESTINAL: No nausea, vomiting, diarrhea or abdominal pain.  GENITOURINARY: No dysuria, hematuria.  ENDOCRINE: No polyuria, nocturia,  HEMATOLOGY: No anemia, easy bruising or bleeding SKIN: No rash or  lesion. MUSCULOSKELETAL: No joint pain or arthritis.   NEUROLOGIC: No tingling, numbness, weakness.  PSYCHIATRY: No anxiety or depression.   MEDICATIONS AT HOME:  Prior to Admission medications   Medication Sig Start Date End Date Taking? Authorizing Provider  amLODipine (NORVASC) 5 MG tablet Take 5 mg by mouth daily.   Yes Historical Provider, MD  aspirin 81 MG tablet Take 1 tablet by mouth daily. 12/01/10  Yes Historical Provider, MD  carvedilol (COREG) 3.125 MG tablet Take 3.125 mg by mouth 2 (two) times daily.    Yes Historical Provider, MD  donepezil (ARICEPT) 5 MG  tablet Take 1 tablet (5 mg total) by mouth at bedtime. 10/08/15  Yes Merland Hulen Shouts., MD  furosemide (LASIX) 40 MG tablet Take 1 tablet (40 mg total) by mouth daily. 03/18/16  Yes Goerge Hulen Shouts., MD  insulin detemir (LEVEMIR) 100 UNIT/ML injection Inject 0.35 mLs (35 Units total) into the skin 2 (two) times daily. 08/19/15  Yes Kester Hulen Shouts., MD  labetalol (NORMODYNE) 200 MG tablet Take 200 mg by mouth 2 (two) times daily. 04/23/16 04/23/17 Yes Historical Provider, MD  meclizine (ANTIVERT) 25 MG tablet TAKE 1 TABLET BY MOUTH 3 TIMES DAILY AS NEEDED FOR DIZZINESS 08/08/15  Yes Enid Baas, MD  metolazone (ZAROXOLYN) 2.5 MG tablet Take 2.5 mg by mouth 2 (two) times daily.   Yes Historical Provider, MD  nitroGLYCERIN (NITROSTAT) 0.4 MG SL tablet Place 0.4 mg under the tongue every 5 (five) minutes x 3 doses as needed. 04/23/16  Yes Historical Provider, MD  ondansetron (ZOFRAN ODT) 8 MG disintegrating tablet Take 1 tablet (8 mg total) by mouth every 8 (eight) hours as needed for nausea or vomiting. 10/31/15  Yes Sharman Cheek, MD  potassium chloride (K-DUR) 10 MEQ tablet Take 1 tablet (10 mEq total) by mouth 2 (two) times daily. 04/07/16 07/06/16 Yes Trey Sailors, PA-C  sertraline (ZOLOFT) 50 MG tablet TAKE 1 TABLET BY MOUTH ONCE DAILY 03/24/16  Yes Maple Hudson., MD  lacosamide (VIMPAT) 50 MG TABS tablet Take 1 tablet (50 mg total) by mouth 2 (two) times daily. Patient not taking: Reported on 05/01/2016 08/08/15   Enid Baas, MD  mometasone (ELOCON) 0.1 % cream Apply 1 application topically daily. For 2 weeks 03/26/14   Historical Provider, MD      PHYSICAL EXAMINATION:   VITAL SIGNS: Blood pressure (!) 167/82, pulse 68, temperature 97.6 F (36.4 C), temperature source Oral, resp. rate 17, height 5\' 9"  (1.753 m), weight (!) 145.2 kg (320 lb), SpO2 99 %.  GENERAL:  71 y.o.-year-old patient lying in the bed with no acute distress.  EYES: Pupils equal, round, reactive to  light and accommodation. No scleral icterus. Extraocular muscles intact.  HEENT: Head atraumatic, normocephalic. Oropharynx and nasopharynx clear.  NECK:  Supple, no jugular venous distention. No thyroid enlargement, no tenderness.  LUNGS: Normal breath sounds bilaterally, no wheezing, Bilateral crepitation. No use of accessory muscles of respiration.  CARDIOVASCULAR: S1, S2 normal. No murmurs, rubs, or gallops.  ABDOMEN: Soft, nontender, nondistended. Bowel sounds present. No organomegaly or mass. Edema no wall EXTREMITIES: Bilateral pedal edema, swelling extended up to his thighs and lower abdominal wall, cyanosis, or clubbing.  NEUROLOGIC: Cranial nerves II through XII are intact. Muscle strength 5/5 in all extremities. Sensation intact. Gait not checked.  PSYCHIATRIC: The patient is alert and oriented x 3.  SKIN: No obvious rash, lesion, or ulcer.   LABORATORY PANEL:   CBC  Recent Labs  Lab 05/01/16 1414  WBC 5.9  HGB 10.5*  HCT 31.3*  PLT 228  MCV 86.0  MCH 29.0  MCHC 33.7  RDW 16.3*   ------------------------------------------------------------------------------------------------------------------  Chemistries   Recent Labs Lab 05/01/16 1414  NA 135  K 4.6  CL 100*  CO2 29  GLUCOSE 272*  BUN 28*  CREATININE 0.84  CALCIUM 8.5*   ------------------------------------------------------------------------------------------------------------------ estimated creatinine clearance is 118 mL/min (by C-G formula based on SCr of 0.84 mg/dL). ------------------------------------------------------------------------------------------------------------------ No results for input(s): TSH, T4TOTAL, T3FREE, THYROIDAB in the last 72 hours.  Invalid input(s): FREET3   Coagulation profile No results for input(s): INR, PROTIME in the last 168 hours. ------------------------------------------------------------------------------------------------------------------- No results for  input(s): DDIMER in the last 72 hours. -------------------------------------------------------------------------------------------------------------------  Cardiac Enzymes  Recent Labs Lab 05/01/16 1414  TROPONINI 0.05*   ------------------------------------------------------------------------------------------------------------------ Invalid input(s): POCBNP  ---------------------------------------------------------------------------------------------------------------  Urinalysis    Component Value Date/Time   COLORURINE STRAW (A) 03/19/2016 1745   APPEARANCEUR CLEAR (A) 03/19/2016 1745   APPEARANCEUR HAZY 12/31/2013 1558   LABSPEC 1.004 (L) 03/19/2016 1745   LABSPEC 1.010 12/31/2013 1558   PHURINE 5.0 03/19/2016 1745   GLUCOSEU NEGATIVE 03/19/2016 1745   GLUCOSEU 100 mg/dL 40/98/1191 4782   HGBUR MODERATE (A) 03/19/2016 1745   BILIRUBINUR NEGATIVE 03/19/2016 1745   BILIRUBINUR neg 10/08/2015 1548   BILIRUBINUR NEGATIVE 12/31/2013 1558   KETONESUR NEGATIVE 03/19/2016 1745   PROTEINUR NEGATIVE 03/19/2016 1745   UROBILINOGEN negative 10/08/2015 1548   NITRITE NEGATIVE 03/19/2016 1745   LEUKOCYTESUR NEGATIVE 03/19/2016 1745   LEUKOCYTESUR NEGATIVE 12/31/2013 1558     RADIOLOGY: Dg Chest 2 View  Result Date: 05/01/2016 CLINICAL DATA:  Edema, shortness of Breath EXAM: CHEST  2 VIEW COMPARISON:  04/07/2016 FINDINGS: Cardiomegaly again noted. There is mild interstitial prominence bilaterally suspicious for pulmonary edema or pneumonitis. Mild basilar atelectasis. No segmental infiltrate. IMPRESSION: Cardiomegaly. Mild interstitial prominence bilaterally suspicious for interstitial edema or pneumonitis. Mild basilar atelectasis. No segmental infiltrate. Electronically Signed   By: Natasha Mead M.D.   On: 05/01/2016 14:36    EKG: Orders placed or performed during the hospital encounter of 05/01/16  . ED EKG within 10 minutes  . ED EKG within 10 minutes    IMPRESSION AND  PLAN:  * Acute congestive heart failure   Ejection fraction was 50% as per previous records.   We'll give IV Lasix, monitor intake and output, fluid restriction, daily weight monitoring.   Get an echocardiogram.   I counseled to drink 1500 mL of liquids in a day.  * Coronary artery disease   His taking aspirin, carvedilol, statin.  * Hypertension   Continue home meds.  * Diabetes   Keep on insulin sliding scale coverage.  * Hearing deficit   We'll give eardrops for wax.   If it does not help then may need ENT consult for ear exam.  All the records are reviewed and case discussed with ED provider. Management plans discussed with the patient, family and they are in agreement.  CODE STATUS: full Code Status History    Date Active Date Inactive Code Status Order ID Comments User Context   08/06/2015  1:32 AM 08/08/2015 10:04 PM Full Code [id number]  Oralia Manis, MD Inpatient   07/22/2015 12:52 AM 07/27/2015  7:56 PM Full Code [id number]  Coralyn Helling, MD Inpatient   07/10/2015  9:09 AM 07/10/2015  1:13 PM Full Code [id number]  Lamar Blinks, MD Inpatient       TOTAL TIME TAKING CARE OF  THIS PATIENT: 50 minutes.    Altamese Dilling M.D on 05/01/2016   Between 7am to 6pm - Pager - [phone]  After 6pm go to [email or web address] - password [email or web address]  [facility]  Office  [phone]  CC: Primary care physician; Megan Mans, MD   Note: This dictation was prepared with Dragon dictation along with smaller phrase technology. Any transcriptional errors that result from this process are unintentional.

## 2016-05-01 NOTE — ED Notes (Signed)
The rest of Lasix 40 mg given to patient after BP checked

## 2016-05-01 NOTE — ED Notes (Signed)
Discussed patient's elevated troponin with Dr. Don Perking.

## 2016-05-01 NOTE — ED Notes (Signed)
Patient given 40 mg of lasix at this time. The other 40 mg to be given shortly. BP would be  rechecked

## 2016-05-02 ENCOUNTER — Inpatient Hospital Stay: Payer: PPO

## 2016-05-02 LAB — BASIC METABOLIC PANEL
Anion gap: 6 (ref 5–15)
BUN: 24 mg/dL — ABNORMAL HIGH (ref 6–20)
CALCIUM: 8.6 mg/dL — AB (ref 8.9–10.3)
CO2: 32 mmol/L (ref 22–32)
CREATININE: 0.94 mg/dL (ref 0.61–1.24)
Chloride: 99 mmol/L — ABNORMAL LOW (ref 101–111)
GFR calc Af Amer: >60 mL/min (ref >60)
Glucose, Bld: 226 mg/dL — ABNORMAL HIGH (ref 65–99)
Potassium: 4.4 mmol/L (ref 3.5–5.1)
Sodium: 137 mmol/L (ref 135–145)

## 2016-05-02 LAB — CBC
HCT: 31 % — ABNORMAL LOW (ref 40.0–52.0)
Hemoglobin: 10.5 g/dL — ABNORMAL LOW (ref 13.0–18.0)
MCH: 28.6 pg (ref 26.0–34.0)
MCHC: 33.7 g/dL (ref 32.0–36.0)
MCV: 84.9 fL (ref 80.0–100.0)
PLATELETS: 226 10*3/uL (ref 150–440)
RBC: 3.65 MIL/uL — AB (ref 4.40–5.90)
RDW: 16.9 % — ABNORMAL HIGH (ref 11.5–14.5)
WBC: 6.9 10*3/uL (ref 3.8–10.6)

## 2016-05-02 LAB — GLUCOSE, CAPILLARY
GLUCOSE-CAPILLARY: 147 mg/dL — AB (ref 65–99)
Glucose-Capillary: 202 mg/dL — ABNORMAL HIGH (ref 65–99)
Glucose-Capillary: 174 mg/dL — ABNORMAL HIGH (ref 65–99)
Glucose-Capillary: 146 mg/dL — ABNORMAL HIGH (ref 65–99)

## 2016-05-02 LAB — TROPONIN I: TROPONIN I: 0.06 ng/mL — AB (ref <0.03)

## 2016-05-02 MED ORDER — FUROSEMIDE 10 MG/ML IJ SOLN
60.0000 mg | Freq: Three times a day (TID) | INTRAMUSCULAR | Status: DC
  Administered 2016-05-02 – 2016-05-05 (×10): 60 mg via INTRAVENOUS
  Filled 2016-05-02 (×10): qty 6

## 2016-05-02 MED ORDER — CARBAMIDE PEROXIDE 6.5 % OT SOLN
5.0000 [drp] | Freq: Three times a day (TID) | OTIC | Status: DC
  Administered 2016-05-02 – 2016-05-05 (×11): 5 [drp] via OTIC
  Filled 2016-05-02: qty 15

## 2016-05-02 NOTE — Progress Notes (Signed)
[employer] - [place] at [facility]   PATIENT NAME: Jay [name]    MR#:  [id number]  DATE OF BIRTH:  09-02-1945  SUBJECTIVE:  CHIEF COMPLAINT:   Chief Complaint  Patient presents with  . Leg Swelling  . Shortness of Breath  . Hearing Loss   Hearing loss due to wax. Shortness of breath improved. Lower extremity edema improving. Increased urine output.  REVIEW OF SYSTEMS:    Review of Systems  Constitutional: Positive for malaise/fatigue. Negative for chills and fever.  HENT: Positive for hearing loss. Negative for sore throat.   Eyes: Negative for blurred vision, double vision and pain.  Respiratory: Negative for cough, hemoptysis, shortness of breath and wheezing.   Cardiovascular: Positive for leg swelling. Negative for chest pain, palpitations and orthopnea.  Gastrointestinal: Negative for abdominal pain, constipation, diarrhea, heartburn, nausea and vomiting.  Genitourinary: Negative for dysuria and hematuria.  Musculoskeletal: Negative for back pain and joint pain.  Skin: Negative for rash.  Neurological: Positive for weakness. Negative for sensory change, speech change, focal weakness and headaches.  Endo/Heme/Allergies: Does not bruise/bleed easily.  Psychiatric/Behavioral: Negative for depression. The patient is not nervous/anxious.     DRUG ALLERGIES:   Allergies  Allergen Reactions  . Metformin Nausea Only    VITALS:  Blood pressure (!) 153/64, pulse 78, temperature 97.7 F (36.5 C), temperature source Oral, resp. rate 20, height 5\' 9"  (1.753 m), weight (!) 138.9 kg (306 lb 3.2 oz), SpO2 94 %.  PHYSICAL EXAMINATION:   Physical Exam  GENERAL:  71 y.o.-year-old patient lying in the bed with no acute distress.  EYES: Pupils equal, round, reactive to light and accommodation. No scleral icterus. Extraocular muscles intact.  HEENT: Head atraumatic, normocephalic. Oropharynx and nasopharynx clear.  Bilateral ear canals plugged with  cerumen NECK:  Supple, no jugular venous distention. No thyroid enlargement, no tenderness.  LUNGS: Bilateral crackles CARDIOVASCULAR: S1, S2 normal. No murmurs, rubs, or gallops.  ABDOMEN: Soft, nontender, nondistended. Bowel sounds present. No organomegaly or mass.  EXTREMITIES: No cyanosis, clubbing. Edema + NEUROLOGIC: Cranial nerves II through XII are intact. No focal Motor or sensory deficits b/l.   PSYCHIATRIC: The patient is alert and oriented x 3.  SKIN: No obvious rash, lesion, or ulcer.  Decreased hearing  LABORATORY PANEL:   CBC  Recent Labs Lab 05/02/16 0121  WBC 6.9  HGB 10.5*  HCT 31.0*  PLT 226   ------------------------------------------------------------------------------------------------------------------ Chemistries   Recent Labs Lab 05/02/16 0121  NA 137  K 4.4  CL 99*  CO2 32  GLUCOSE 226*  BUN 24*  CREATININE 0.94  CALCIUM 8.6*   ------------------------------------------------------------------------------------------------------------------  Cardiac Enzymes  Recent Labs Lab 05/02/16 0121  TROPONINI 0.06*   ------------------------------------------------------------------------------------------------------------------  RADIOLOGY:  Dg Chest 2 View  Result Date: 05/02/2016 CLINICAL DATA:  Congestive heart failure. EXAM: CHEST  2 VIEW COMPARISON:  Yesterday. FINDINGS: Normal sized heart with an interval decrease in size. Coronary artery stent is noted. Stable prominence of the pulmonary vasculature and interstitial markings with small bilateral pleural effusions. Diffuse osteopenia. IMPRESSION: Improved cardiomegaly with stable changes of congestive heart failure. Electronically Signed   By: Beckie Salts M.D.   On: 05/02/2016 08:11   Dg Chest 2 View  Result Date: 05/01/2016 CLINICAL DATA:  Edema, shortness of Breath EXAM: CHEST  2 VIEW COMPARISON:  04/07/2016 FINDINGS: Cardiomegaly again noted. There is mild interstitial prominence  bilaterally suspicious for pulmonary edema or pneumonitis. Mild basilar atelectasis. No segmental infiltrate. IMPRESSION: Cardiomegaly. Mild interstitial prominence  bilaterally suspicious for interstitial edema or pneumonitis. Mild basilar atelectasis. No segmental infiltrate. Electronically Signed   By: Natasha Mead M.D.   On: 05/01/2016 14:36     ASSESSMENT AND PLAN:   * Acute on chronic diastolic congestive heart failure   Ejection fraction was 50%- last echo - IV Lasix, Beta blockers - Input and Output - Counseled to limit fluids and Salt - Monitor Bun/Cr and Potassium -Cardiology follow up after discharge  * Coronary artery disease   His taking aspirin, carvedilol, statin.  * Hypertension   Continue home meds.  * Diabetes   Keep on insulin sliding scale coverage.  * Hearing deficit   On eardrops for wax.   All the records are reviewed and case discussed with Care Management/Social Worker Management plans discussed with the patient, family and they are in agreement.  CODE STATUS: FULL CODE  DVT Prophylaxis: SCDs  TOTAL TIME TAKING CARE OF THIS PATIENT: 35 minutes.   POSSIBLE D/C IN 2-3 DAYS, DEPENDING ON CLINICAL CONDITION.  Milagros Loll R M.D on 05/02/2016 at 11:39 AM  Between 7am to 6pm - Pager - [phone]  After 6pm go to [email or web address] - password [email or web address] [email or web address]  [facility] [place] [facility]  Office  [phone]  CC: Primary care physician; Megan Mans, MD  Note: This dictation was prepared with Dragon dictation along with smaller phrase technology. Any transcriptional errors that result from this process are unintentional.

## 2016-05-03 LAB — BASIC METABOLIC PANEL
Anion gap: 6 (ref 5–15)
BUN: 25 mg/dL — AB (ref 6–20)
CO2: 37 mmol/L — AB (ref 22–32)
Calcium: 8.8 mg/dL — ABNORMAL LOW (ref 8.9–10.3)
Chloride: 93 mmol/L — ABNORMAL LOW (ref 101–111)
Creatinine, Ser: 0.82 mg/dL (ref 0.61–1.24)
GFR calc Af Amer: >60 mL/min (ref >60)
GLUCOSE: 147 mg/dL — AB (ref 65–99)
POTASSIUM: 4.1 mmol/L (ref 3.5–5.1)
Sodium: 136 mmol/L (ref 135–145)

## 2016-05-03 LAB — GLUCOSE, CAPILLARY
GLUCOSE-CAPILLARY: 145 mg/dL — AB (ref 65–99)
Glucose-Capillary: 93 mg/dL (ref 65–99)
Glucose-Capillary: 181 mg/dL — ABNORMAL HIGH (ref 65–99)
Glucose-Capillary: 129 mg/dL — ABNORMAL HIGH (ref 65–99)
Glucose-Capillary: 174 mg/dL — ABNORMAL HIGH (ref 65–99)

## 2016-05-03 LAB — MAGNESIUM: Magnesium: 1.5 mg/dL — ABNORMAL LOW (ref 1.7–2.4)

## 2016-05-03 MED ORDER — MAGNESIUM SULFATE 2 GM/50ML IV SOLN
2.0000 g | Freq: Once | INTRAVENOUS | Status: AC
  Administered 2016-05-03: 2 g via INTRAVENOUS
  Filled 2016-05-03: qty 50

## 2016-05-03 NOTE — Progress Notes (Signed)
[employer] - [place] at [facility]   PATIENT NAME: Jay [name]    MR#:  [id number]  DATE OF BIRTH:  [birth year]  SUBJECTIVE:  CHIEF COMPLAINT:   Chief Complaint  Patient presents with  . Leg Swelling  . Shortness of Breath  . Hearing Loss   Hearing improved in the left ear today.  Mild shortness of breath. Still has persistent lower extremity edema. Diuresing well. Weight decreased by 28 pounds in 2 days.  REVIEW OF SYSTEMS:    Review of Systems  Constitutional: Positive for malaise/fatigue. Negative for chills and fever.  HENT: Positive for hearing loss. Negative for sore throat.   Eyes: Negative for blurred vision, double vision and pain.  Respiratory: Negative for cough, hemoptysis, shortness of breath and wheezing.   Cardiovascular: Positive for leg swelling. Negative for chest pain, palpitations and orthopnea.  Gastrointestinal: Negative for abdominal pain, constipation, diarrhea, heartburn, nausea and vomiting.  Genitourinary: Negative for dysuria and hematuria.  Musculoskeletal: Negative for back pain and joint pain.  Skin: Negative for rash.  Neurological: Positive for weakness. Negative for sensory change, speech change, focal weakness and headaches.  Endo/Heme/Allergies: Does not bruise/bleed easily.  Psychiatric/Behavioral: Negative for depression. The patient is not nervous/anxious.     DRUG ALLERGIES:   Allergies  Allergen Reactions  . Metformin Nausea Only    VITALS:  Blood pressure 135/63, pulse 67, temperature 97.5 F (36.4 C), temperature source Oral, resp. rate 17, height 5\' 9"  (1.753 m), weight 132.5 kg (292 lb), SpO2 97 %.  PHYSICAL EXAMINATION:   Physical Exam  GENERAL:  71 y.o.-year-old patient lying in the bed with no acute distress.  EYES: Pupils equal, round, reactive to light and accommodation. No scleral icterus. Extraocular muscles intact.  HEENT: Head atraumatic, normocephalic. Oropharynx and nasopharynx clear.   Bilateral ear canals plugged with cerumen NECK:  Supple, no jugular venous distention. No thyroid enlargement, no tenderness.  LUNGS: Bilateral crackles CARDIOVASCULAR: S1, S2 normal. No murmurs, rubs, or gallops.  ABDOMEN: Soft, nontender, nondistended. Bowel sounds present. No organomegaly or mass.  EXTREMITIES: No cyanosis, clubbing. Edema + NEUROLOGIC: Cranial nerves II through XII are intact. No focal Motor or sensory deficits b/l.   PSYCHIATRIC: The patient is alert and oriented x 3.  SKIN: No obvious rash, lesion, or ulcer.  Decreased hearing  LABORATORY PANEL:   CBC  Recent Labs Lab 05/02/16 0121  WBC 6.9  HGB 10.5*  HCT 31.0*  PLT 226   ------------------------------------------------------------------------------------------------------------------ Chemistries   Recent Labs Lab 05/03/16 0452  NA 136  K 4.1  CL 93*  CO2 37*  GLUCOSE 147*  BUN 25*  CREATININE 0.82  CALCIUM 8.8*  MG 1.5*   ------------------------------------------------------------------------------------------------------------------  Cardiac Enzymes  Recent Labs Lab 05/02/16 0121  TROPONINI 0.06*   ------------------------------------------------------------------------------------------------------------------  RADIOLOGY:  Dg Chest 2 View  Result Date: 05/02/2016 CLINICAL DATA:  Congestive heart failure. EXAM: CHEST  2 VIEW COMPARISON:  Yesterday. FINDINGS: Normal sized heart with an interval decrease in size. Coronary artery stent is noted. Stable prominence of the pulmonary vasculature and interstitial markings with small bilateral pleural effusions. Diffuse osteopenia. IMPRESSION: Improved cardiomegaly with stable changes of congestive heart failure. Electronically Signed   By: Beckie Salts M.D.   On: 05/02/2016 08:11   Dg Chest 2 View  Result Date: 05/01/2016 CLINICAL DATA:  Edema, shortness of Breath EXAM: CHEST  2 VIEW COMPARISON:  04/07/2016 FINDINGS: Cardiomegaly again  noted. There is mild interstitial prominence bilaterally suspicious for pulmonary edema or pneumonitis.  Mild basilar atelectasis. No segmental infiltrate. IMPRESSION: Cardiomegaly. Mild interstitial prominence bilaterally suspicious for interstitial edema or pneumonitis. Mild basilar atelectasis. No segmental infiltrate. Electronically Signed   By: Natasha Mead M.D.   On: 05/01/2016 14:36     ASSESSMENT AND PLAN:   * Acute on chronic diastolic congestive heart failure   Ejection fraction was 50%- last echo - IV Lasix, Beta blockers - Input and Output - Counseled to limit fluids and Salt - Monitor Bun/Cr and Potassium -Cardiology follow up after discharge  Likely discharge tomorrow with increased Lasix at home.  * Coronary artery disease   His taking aspirin, carvedilol, statin.  * Hypertension   Continue home meds.  * Diabetes   Keep on insulin sliding scale coverage.  * Hearing deficit   On eardrops for Cerumen.   All the records are reviewed and case discussed with Care Management/Social Worker Management plans discussed with the patient, family and they are in agreement.  CODE STATUS: FULL CODE  DVT Prophylaxis: SCDs  TOTAL TIME TAKING CARE OF THIS PATIENT: 35 minutes.   POSSIBLE D/C IN 1-2 DAYS, DEPENDING ON CLINICAL CONDITION.  Milagros Loll R M.D on 05/03/2016 at 11:43 AM  Between 7am to 6pm - Pager - [phone]  After 6pm go to [email or web address] - password [email or web address] [email or web address]  [facility] [place] [facility]  Office  [phone]  CC: Primary care physician; Megan Mans, MD  Note: This dictation was prepared with Dragon dictation along with smaller phrase technology. Any transcriptional errors that result from this process are unintentional.

## 2016-05-03 NOTE — Plan of Care (Signed)
Problem: Education: Goal: Ability to demonstrate managment of disease process will improve Outcome: Not Progressing Patient educated on daily weights and given heart failure packet. Patient nodded head in approval but no evidence of learning. Son was at bedside also and given packet and education also. Encouraged son to share information with patient and to help patient post discharge. Son verbalized understanding.

## 2016-05-04 LAB — GLUCOSE, CAPILLARY
GLUCOSE-CAPILLARY: 136 mg/dL — AB (ref 65–99)
GLUCOSE-CAPILLARY: 295 mg/dL — AB (ref 65–99)
GLUCOSE-CAPILLARY: 211 mg/dL — AB (ref 65–99)
GLUCOSE-CAPILLARY: 305 mg/dL — AB (ref 65–99)
GLUCOSE-CAPILLARY: 301 mg/dL — AB (ref 65–99)
Glucose-Capillary: 56 mg/dL — ABNORMAL LOW (ref 65–99)
Glucose-Capillary: 172 mg/dL — ABNORMAL HIGH (ref 65–99)
Glucose-Capillary: 307 mg/dL — ABNORMAL HIGH (ref 65–99)

## 2016-05-04 LAB — BASIC METABOLIC PANEL
Anion gap: 8 (ref 5–15)
BUN: 28 mg/dL — AB (ref 6–20)
CHLORIDE: 87 mmol/L — AB (ref 101–111)
CO2: 37 mmol/L — ABNORMAL HIGH (ref 22–32)
CREATININE: 1.17 mg/dL (ref 0.61–1.24)
Calcium: 9 mg/dL (ref 8.9–10.3)
GFR calc Af Amer: >60 mL/min (ref >60)
GFR calc non Af Amer: >60 mL/min (ref >60)
GLUCOSE: 144 mg/dL — AB (ref 65–99)
Potassium: 3.6 mmol/L (ref 3.5–5.1)
SODIUM: 132 mmol/L — AB (ref 135–145)

## 2016-05-04 LAB — MAGNESIUM: MAGNESIUM: 1.8 mg/dL (ref 1.7–2.4)

## 2016-05-04 MED ORDER — ENOXAPARIN SODIUM 40 MG/0.4ML ~~LOC~~ SOLN
40.0000 mg | Freq: Two times a day (BID) | SUBCUTANEOUS | Status: DC
  Administered 2016-05-04 – 2016-05-05 (×2): 40 mg via SUBCUTANEOUS
  Filled 2016-05-04 (×2): qty 0.4

## 2016-05-04 MED ORDER — INSULIN DETEMIR 100 UNIT/ML ~~LOC~~ SOLN
20.0000 [IU] | Freq: Two times a day (BID) | SUBCUTANEOUS | Status: DC
  Administered 2016-05-04 – 2016-05-05 (×2): 20 [IU] via SUBCUTANEOUS
  Filled 2016-05-04 (×5): qty 0.2

## 2016-05-04 NOTE — Progress Notes (Signed)
Initial HF Clinic appointment scheduled on May 12, 2016 at 8:40am Thank you.

## 2016-05-04 NOTE — Discharge Instructions (Signed)
Heart Failure Clinic appointment on May 12, 2016 at 8:40am with Clarisa Kindred, FNP. Please call [phone] to reschedule.

## 2016-05-04 NOTE — Progress Notes (Signed)
Inpatient Diabetes Program Recommendations  AACE/ADA: New Consensus Statement on Inpatient Glycemic Control (2015)  Target Ranges:  Prepandial:   less than 140 mg/dL      Peak postprandial:   less than 180 mg/dL (1-2 hours)      Critically ill patients:  140 - 180 mg/dL   Results for AMILLIO, MULCAHY (MRN [id number]) as of 05/04/2016 14:33  Ref. Range 05/04/2016 04:38 05/04/2016 05:44 05/04/2016 08:00 05/04/2016 12:13  Glucose-Capillary Latest Ref Range: 65 - 99 mg/dL 56 (L) 696 (H) 295 (H) 211 (H)    Home DM Meds: Levemir 35 units BID  Current Insulin Orders: Levemir 20 units BID      Novolog Sensitive Correction Scale/ SSI (0-9 units) TID AC       MD- Note patient had Hypoglycemia this AM.  Also note Levemir dose reduced to 20 units BID.  Patient is eating 100% of meals and was elevated at 12pm today.  May need addition of low dose Novolog Meal Coverage:  Please consider starting Novolog 3 units TID with meals (hold if pt eats <50% of meal)     --Will follow patient during hospitalization--  Ambrose Finland RN, MSN, CDE Diabetes Coordinator Inpatient Glycemic Control Team Team Pager: [phone] (8a-5p)

## 2016-05-04 NOTE — Progress Notes (Signed)
Chaplain ran into patient while patient was walking around the unit.  Chaplain commended patient for walking, which was good for his health. Patient was not interested in prayer, but a word of encouragement.   05/04/16 1500  Clinical Encounter Type  Visited With Patient  Visit Type Initial  Spiritual Encounters  Spiritual Needs Emotional;Other (Comment)

## 2016-05-04 NOTE — Plan of Care (Signed)
Problem: Education: Goal: Ability to demonstrate managment of disease process will improve Outcome: Not Progressing No evidence of learning observed in patient, patient will need help from family members at discharge in managing disease.

## 2016-05-04 NOTE — Progress Notes (Signed)
[facility] - [place] [facility]   PATIENT NAME: Jay [name]    MR#:  [id number]  DATE OF BIRTH:  [birth year]  SUBJECTIVE:  Still Has shortness of breath, hearing loss.   CHIEF COMPLAINT:   Chief Complaint  Patient presents with  . Leg Swelling  . Shortness of Breath  . Hearing Loss   Hearing improved in the left ear today.  Mild shortness of breath. Still has persistent lower extremity edema. Diuresing well. Weight decreased by 28 pounds in 2 days.  REVIEW OF SYSTEMS:    Review of Systems  Constitutional: Negative for chills, fever and malaise/fatigue.  HENT: Positive for hearing loss. Negative for sore throat.   Eyes: Negative for blurred vision, double vision and pain.  Respiratory: Negative for cough, hemoptysis, shortness of breath and wheezing.   Cardiovascular: Positive for leg swelling. Negative for chest pain, palpitations and orthopnea.  Gastrointestinal: Negative for abdominal pain, constipation, diarrhea, heartburn, nausea and vomiting.  Genitourinary: Negative for dysuria and hematuria.  Musculoskeletal: Negative for back pain and joint pain.  Skin: Negative for rash.  Neurological: Negative for sensory change, speech change, focal weakness, weakness and headaches.  Endo/Heme/Allergies: Does not bruise/bleed easily.  Psychiatric/Behavioral: Negative for depression. The patient is not nervous/anxious.     DRUG ALLERGIES:   Allergies  Allergen Reactions  . Metformin Nausea Only    VITALS:  Blood pressure (!) 116/47, pulse 73, temperature 97.6 F (36.4 C), temperature source Oral, resp. rate 17, height  (1.753 m), weight (!) 136.5 kg (300 lb 14.4 oz), SpO2 95 %.  PHYSICAL EXAMINATION:   Physical Exam  GENERAL:  71 y.o.-year-old patient lying in the bed with no acute distress.  EYES: Pupils equal, round, reactive to light and accommodation. No scleral icterus. Extraocular muscles intact.  HEENT: Head atraumatic, normocephalic.  Oropharynx and nasopharynx clear.  Bilateral ear canals plugged with cerumen NECK:  Supple, no jugular venous distention. No thyroid enlargement, no tenderness.  LUNGS: Bilateral crackles CARDIOVASCULAR: S1, S2 normal. No murmurs, rubs, or gallops.  ABDOMEN: Soft, nontender, nondistended. Bowel sounds present. No organomegaly or mass.  EXTREMITIES: No cyanosis, clubbing. Edema + NEUROLOGIC: Cranial nerves II through XII are intact. No focal Motor or sensory deficits b/l.   PSYCHIATRIC: The patient is alert and oriented x 3.  SKIN: No obvious rash, lesion, or ulcer.  Decreased hearing  LABORATORY PANEL:   CBC  Recent Labs Lab 05/02/16 0121  WBC 6.9  HGB 10.5*  HCT 31.0*  PLT 226   ------------------------------------------------------------------------------------------------------------------ Chemistries   Recent Labs Lab 05/04/16 0544  NA 132*  K 3.6  CL 87*  CO2 37*  GLUCOSE 144*  BUN 28*  CREATININE 1.17  CALCIUM 9.0  MG 1.8   ------------------------------------------------------------------------------------------------------------------  Cardiac Enzymes  Recent Labs Lab 05/02/16 0121  TROPONINI 0.06*   ------------------------------------------------------------------------------------------------------------------  RADIOLOGY:  No results found.   ASSESSMENT AND PLAN:   * Acute on chronic diastolic congestive heart failure   Ejection fraction was 50%- last echo - IV Lasix, Beta blockers - Input and Output - Counseled to limit fluids and Salt - Monitor Bun/Cr and Potassium -Cardiology follow up after discharge Still swollen,continue IV lasix. Possible d/c am, * Coronary artery disease   His taking aspirin, carvedilol, statin.  * Hypertension   Continue home meds.  * Diabetes   Keep on insulin sliding scale coverage.  * Hearing deficit   On eardrops for Cerumen. ENT consult today  All the records are reviewed and case  discussed  with Care Management/Social Worker Management plans discussed with the patient, family and they are in agreement.  CODE STATUS: FULL CODE  DVT Prophylaxis: SCDs  TOTAL TIME TAKING CARE OF THIS PATIENT: 35 minutes.   POSSIBLE D/C IN 1-2 DAYS, DEPENDING ON CLINICAL CONDITION.  Katha Hamming M.D on 05/04/2016 at 1:30 PM  Between 7am to 6pm - Pager - [phone]  After 6pm go to [email or web address] - password [email or web address] [facility]  [facility]  Office  [phone]  CC: Primary care physician; Megan Mans, MD  Note: This dictation was prepared with Dragon dictation along with smaller phrase technology. Any transcriptional errors that result from this process are unintentional.

## 2016-05-05 LAB — CBC
HCT: 29.7 % — ABNORMAL LOW (ref 40.0–52.0)
Hemoglobin: 10 g/dL — ABNORMAL LOW (ref 13.0–18.0)
MCH: 28.2 pg (ref 26.0–34.0)
MCHC: 33.5 g/dL (ref 32.0–36.0)
MCV: 84.1 fL (ref 80.0–100.0)
Platelets: 229 10*3/uL (ref 150–440)
RBC: 3.53 MIL/uL — AB (ref 4.40–5.90)
RDW: 16.2 % — ABNORMAL HIGH (ref 11.5–14.5)
WBC: 6.8 10*3/uL (ref 3.8–10.6)

## 2016-05-05 LAB — GLUCOSE, CAPILLARY
GLUCOSE-CAPILLARY: 281 mg/dL — AB (ref 65–99)
Glucose-Capillary: 223 mg/dL — ABNORMAL HIGH (ref 65–99)
Glucose-Capillary: 265 mg/dL — ABNORMAL HIGH (ref 65–99)

## 2016-05-05 MED ORDER — FUROSEMIDE 40 MG PO TABS: 40.0000 mg | ORAL_TABLET | Freq: Two times a day (BID) | ORAL | 0 refills | Status: AC

## 2016-05-05 MED ORDER — INSULIN DETEMIR 100 UNIT/ML ~~LOC~~ SOLN: 25.0000 [IU] | Freq: Two times a day (BID) | SUBCUTANEOUS | 11 refills | Status: AC

## 2016-05-05 MED ORDER — INSULIN DETEMIR 100 UNIT/ML ~~LOC~~ SOLN: 20.0000 [IU] | Freq: Two times a day (BID) | SUBCUTANEOUS | 11 refills | Status: DC

## 2016-05-05 MED ORDER — CARBAMIDE PEROXIDE 6.5 % OT SOLN: 5.0000 [drp] | Freq: Three times a day (TID) | OTIC | 0 refills | Status: DC

## 2016-05-05 MED ORDER — DOCUSATE SODIUM 100 MG PO CAPS: 100.0000 mg | ORAL_CAPSULE | Freq: Two times a day (BID) | ORAL | 0 refills | Status: AC | PRN

## 2016-05-05 NOTE — Care Management (Signed)
Independent in all adls, denies issues accessing medical care, obtaining medications or with transportation.  Current with PCP. Not requiring oxygen.  Referral has been made to the Heart Failure Clinic and CM made [facility] referral.  Patient declines home health.  Asked multiple times to make sure patient understood as he is very very hard of hearing.

## 2016-05-05 NOTE — Progress Notes (Signed)
Inpatient Diabetes Program Recommendations  AACE/ADA: New Consensus Statement on Inpatient Glycemic Control (2015)  Target Ranges:  Prepandial:   less than 140 mg/dL      Peak postprandial:   less than 180 mg/dL (1-2 hours)      Critically ill patients:  140 - 180 mg/dL   Lab Results  Component Value Date   GLUCAP 265 (H) 05/05/2016   HGBA1C 9.2 04/08/2016   Results for TREVAR, MAROVICH (MRN [id number]) as of 05/05/2016 11:50  Ref. Range 05/04/2016 19:32 05/04/2016 20:33 05/04/2016 20:54 05/05/2016 07:30 05/05/2016 11:03  Glucose-Capillary Latest Ref Range: 65 - 99 mg/dL 929 (H) 244 (H) 628 (H) 223 (H) 265 (H)    Home DM Meds: Levemir 35 units BID  Current Insulin Orders: Levemir 20 units BID                                       Novolog Sensitive Correction Scale/ SSI (0-9 units) TID AC    MD-  Levemir dose reduced to 20 units BID yesterday from 35 units bid after patient experienced.  Now fasting blood sugar elevated- consider increasing Levemir to 25 units bid    Please consider starting Novolog 3 units TID with meals (hold if pt eats <50% of meal)  Continue Novolog correction 0-9 units tid as ordered.   Susette Racer, RN, BA, MHA, CDE Diabetes Coordinator Inpatient Diabetes Program  [phone] (Team Pager) [phone] [facility] Office) 05/05/2016 11:58 AM

## 2016-05-05 NOTE — Discharge Summary (Signed)
Jay [name], is a 71 y.o. male  DOB 05/17/1945  MRN [id number].  Admission date:  05/01/2016  Admitting Physician  Katharina Caper, MD  Discharge Date:  05/05/2016   Primary MD  Megan Mans, MD  Recommendations for primary care physician for things to follow:   Follow-up with primary doctor in 1 week Follow up with [facility] in one week regarding hearing loss secondary to wax buildup.   Admission Diagnosis  CHF (congestive heart failure) (HCC) [I50.9] Acute respiratory failure with hypoxia (HCC) [J96.01] Acute on chronic congestive heart failure, unspecified congestive heart failure type (HCC) [I50.9]   Discharge Diagnosis  CHF (congestive heart failure) (HCC) [I50.9] Acute respiratory failure with hypoxia (HCC) [J96.01] Acute on chronic congestive heart failure, unspecified congestive heart failure type (HCC) [I50.9]    Principal Problem:   Acute on chronic systolic CHF (congestive heart failure) (HCC) Active Problems:   CHF exacerbation (HCC)      Past Medical History:  Diagnosis Date  . Anemia   . Angina pectoris (HCC)   . Bilateral diabetic retinopathy (HCC)   . CAD (coronary artery disease)   . CHF (congestive heart failure) (HCC)   . Diabetes mellitus without complication (HCC)   . Fatty liver   . Foot deformities, congenital   . GERD (gastroesophageal reflux disease)   . Hyperlipidemia   . Hypertension   . Morbid obesity (HCC)   . Pedal edema   . Sinoatrial node dysfunction (HCC)   . Sleep apnea    not tolerating CPAP    Past Surgical History:  Procedure Laterality Date  . CARDIAC CATHETERIZATION Left 07/10/2015   Procedure: Coronary Angiography;  Surgeon: Lamar Blinks, MD;  Location: [facility] INVASIVE CV LAB;  Service: Cardiovascular;  Laterality: Left;  . CORONARY ANGIOPLASTY WITH  STENT PLACEMENT  2006 and 2008       History of present illness and  Hospital Course:     Kindly see H&P for history of present illness and admission details, please review complete Labs, Consult reports and Test reports for all details in brief  HPI  from the history and physical done on the day of admission Jay [name]-year-old male patient with history of diabetic retinopathy, coronary artery disease, congestive heart failure, diabetes mellitus type 2, hyperlipidemia, morbid obesity, sleep apnea sent from primary doctor's office because of weight gain of 30-40 pounds and shortness of breath, pedal edema, admitted for heart failure,  and also patient complained of difficulty hearing.   Hospital Course 1  Acute on chronic diastolic congestive heart failure Ejection fraction was 50%- last echo -improved  with IV Lasix. Discharge home with increased dose of Lasix. Changes her Lasix from 40 mg daily to 40 mg twice a day. sob improved. He is eager to go home. - Coronary artery disease His taking aspirin, carvedilol, statin.  * Hypertension Continue home meds. Discontinue the labetalol because patient is on Coreg now.  * Diabetes Continue Levemir but decrease the dose to 25 units twice a day.  * Hearing deficit examination of ears showed bilateral wax buildup. Started on Debrox eardrops, patient can follow-up with [facility] as an outpatient.   #diabetes mellitus type 2: Patient is on Levemir 25s twice a day  Discharge Condition:   Follow UP  Follow-up Information    Delma Freeze, FNP. Go on 05/12/2016.   Specialty:  Family Medicine Why:  at 8:40am to the Heart Failure Clinic Contact information: [address] [place]  [place] [address] [phone]        Megan Mans, MD Follow up in 1 week(s).   Specialty:  Family Medicine Why:  You will need to call for an appointment, ccs Contact information: [address] [place] [address] [phone]        Sandi Mealy, MD Follow up in 1 week(s).   Specialty:  Otolaryngology Why:  You will need to call for an appointment, ccs Contact information: [address] [place] [address] [phone]             Discharge Instructions  and  Discharge Medications      Allergies as of 05/05/2016      Reactions   Metformin Nausea Only      Medication List    STOP taking these medications   labetalol 200 MG tablet Commonly known as:  NORMODYNE     TAKE these medications   amLODipine 5 MG tablet Commonly known as:  NORVASC Take 5 mg by mouth daily.   aspirin 81 MG tablet Take 1 tablet by mouth daily.   carbamide peroxide 6.5 % otic solution Commonly known as:  DEBROX Place 5 drops into both ears 3 (three) times daily.   carvedilol 3.125 MG tablet Commonly known as:  COREG Take 3.125 mg by mouth 2 (two) times daily.   docusate sodium 100 MG capsule Commonly known as:  COLACE Take 1 capsule (100 mg total) by mouth 2 (two) times daily as needed for mild constipation.   donepezil 5 MG tablet Commonly known as:  ARICEPT Take 1 tablet (5 mg total) by mouth at bedtime.   furosemide 40 MG tablet Commonly known as:  LASIX Take 1 tablet (40 mg total) by mouth 2 (two) times daily. What changed:  when to take this   insulin detemir 100 UNIT/ML injection Commonly known as:  LEVEMIR Inject 0.2 mLs (20 Units total) into the skin 2 (two) times daily. What changed:  how much to take   lacosamide 50 MG Tabs tablet Commonly known as:  VIMPAT Take 1 tablet (50 mg total) by mouth 2 (two) times daily.   meclizine 25 MG tablet Commonly known as:  ANTIVERT TAKE 1 TABLET BY MOUTH 3 TIMES DAILY AS NEEDED FOR DIZZINESS   metolazone 2.5 MG tablet Commonly known as:  ZAROXOLYN Take 2.5 mg by mouth 2 (two) times daily.   mometasone 0.1 % cream Commonly known as:  ELOCON Apply 1 application topically daily.  For 2 weeks   nitroGLYCERIN 0.4 MG SL tablet Commonly known as:  NITROSTAT Place 0.4 mg under the tongue every 5 (five) minutes x 3 doses as needed.   ondansetron 8 MG disintegrating tablet Commonly known as:  ZOFRAN ODT Take 1 tablet (8 mg total) by mouth every 8 (eight) hours as needed for nausea or vomiting.   potassium chloride 10 MEQ tablet Commonly known as:  K-DUR Take 1 tablet (10 mEq total) by mouth 2 (two) times daily.   sertraline 50 MG tablet Commonly known as:  ZOLOFT TAKE 1 TABLET BY MOUTH ONCE DAILY         Diet and Activity recommendation: See Discharge Instructions above   Consults obtained - none   Major procedures and Radiology Reports - PLEASE review detailed and final reports for all details, in brief -     Dg Chest 2 View  Result Date: 05/02/2016 CLINICAL DATA:  Congestive heart failure. EXAM: CHEST  2 VIEW COMPARISON:  Yesterday. FINDINGS: Normal sized heart with an interval decrease in size. Coronary artery stent is noted. Stable prominence of the pulmonary vasculature and interstitial markings with small bilateral pleural effusions. Diffuse osteopenia. IMPRESSION: Improved cardiomegaly with stable changes of congestive heart failure. Electronically Signed   By: Beckie Salts M.D.   On: 05/02/2016 08:11   Dg Chest 2 View  Result Date: 05/01/2016 CLINICAL DATA:  Edema, shortness of Breath EXAM: CHEST  2 VIEW COMPARISON:  04/07/2016 FINDINGS: Cardiomegaly again noted. There is mild interstitial prominence bilaterally suspicious for pulmonary edema or pneumonitis. Mild basilar atelectasis. No segmental infiltrate. IMPRESSION: Cardiomegaly. Mild interstitial prominence bilaterally suspicious for interstitial edema or pneumonitis. Mild basilar atelectasis. No segmental infiltrate. Electronically Signed   By: Natasha Mead M.D.   On: 05/01/2016 14:36   Dg Chest 2 View  Result Date: 04/07/2016 CLINICAL DATA:  One week of shortness of breath, difficulty  breathing, fatigue. History of CHF, coronary artery disease, atrial fibrillation, aortic stenosis, previous CVA, diabetes. EXAM: CHEST  2 VIEW COMPARISON:  PA and lateral chest x-ray of March 19, 2016 FINDINGS: There has been interval improvement in the appearance of the chest with decreased conspicuity of patchy alveolar opacities. The interstitial markings remain increased and confluent alveolar opacities remain visible bilaterally. There is a small amount of pleural fluid blunting the costophrenic angles. The cardiac silhouette remains enlarged. A coronary artery stent is visible. The mediastinum is normal in width. The bony thorax exhibits no acute abnormality. IMPRESSION: CHF with decreased pulmonary interstitial and alveolar edema. Small bilateral pleural effusions blunt the costophrenic angles. Bilateral decubitus x-rays are reported separately. Electronically Signed   By: David  [place] M.D.   On: 04/07/2016 15:18   Dg Chest Bilateral Decubitus  Result Date: 04/07/2016 CLINICAL DATA:  Bilateral decubitus films requested to evaluate the volume of pleural fluid present. EXAM: CHEST - BILATERAL DECUBITUS VIEW COMPARISON:  Chest x-ray of April 07, 2016 FINDINGS: There is a tiny amount of pleural fluid layering along the deep and a right and left thoracic walls on the decubitus radiographs. No large volume pleural effusion is observed. IMPRESSION: Small free-flowing pleural effusions observed bilaterally. Electronically Signed   By: David  [place] M.D.   On: 04/07/2016 15:19    Micro Results    Recent Results (from the past 240 hour(s))  MRSA PCR Screening     Status: None   Collection Time: 05/01/16  9:08 PM  Result Value Ref Range Status   MRSA by PCR NEGATIVE NEGATIVE Final    Comment:        The GeneXpert MRSA Assay (FDA approved for NASAL specimens only), is one component of a comprehensive MRSA colonization surveillance program. It is not intended to diagnose MRSA infection nor to guide  or monitor treatment for MRSA infections.        Today   Subjective:   Lise Auer today has no headache,no chest abdominal pain,no new weakness tingling or numbness, feels much better wants to go home today.   Objective:   Blood pressure 125/63, pulse 72, temperature 98 F (36.7 C), temperature source Oral, resp. rate 18, height 5\' 9"  (1.753 m), weight 124.4 kg (274 lb 4.8 oz), SpO2 94 %.   Intake/Output Summary (Last 24 hours) at 05/05/16 1453 Last data filed at 05/05/16 1432  Gross per 24 hour  Intake              483 ml  Output  0 ml  Net              483 ml    Exam Awake Alert, Oriented x 3, No new F.N deficits, Normal affect [place].AT,PERRAL Supple Neck,No JVD, No cervical lymphadenopathy appriciated.  Symmetrical Chest wall movement, Good air movement bilaterally, CTAB RRR,No Gallops,Rubs or new Murmurs, No Parasternal Heave +ve B.Sounds, Abd Soft, Non tender, No organomegaly appriciated, No rebound -guarding or rigidity. No Cyanosis, Clubbing or edema, No new Rash or bruise  Data Review   CBC w Diff: Lab Results  Component Value Date   WBC 6.8 05/05/2016   HGB 10.0 (L) 05/05/2016   HGB 7.9 (L) 01/02/2014   HCT 29.7 (L) 05/05/2016   HCT 37.8 03/18/2016   PLT 229 05/05/2016   PLT 296 03/18/2016   LYMPHOPCT 11 08/05/2015   LYMPHOPCT 15.2 01/01/2014   MONOPCT 10 08/05/2015   MONOPCT 13.6 01/01/2014   EOSPCT 1 08/05/2015   EOSPCT 2.4 01/01/2014   BASOPCT 0 08/05/2015   BASOPCT 0.4 01/01/2014    CMP: Lab Results  Component Value Date   NA 132 (L) 05/04/2016   NA 132 (L) 04/07/2016   NA 136 01/02/2014   K 3.6 05/04/2016   K 4.2 01/02/2014   CL 87 (L) 05/04/2016   CL 101 01/02/2014   CO2 37 (H) 05/04/2016   CO2 27 01/02/2014   BUN 28 (H) 05/04/2016   BUN 24 04/07/2016   BUN 62 (H) 01/02/2014   CREATININE 1.17 05/04/2016   CREATININE 1.96 (H) 01/02/2014   GLU 293 05/21/2014   PROT 7.3 04/07/2016   PROT 6.6 12/30/2013   ALBUMIN  3.5 (L) 04/07/2016   ALBUMIN 2.7 (L) 12/30/2013   BILITOT 1.1 04/07/2016   BILITOT 0.5 12/30/2013   ALKPHOS 228 (H) 04/07/2016   ALKPHOS 102 12/30/2013   AST 42 (H) 04/07/2016   AST 28 12/30/2013   ALT 39 04/07/2016   ALT 29 12/30/2013  .   Total Time in preparing paper work, data evaluation and todays exam - 35 minutes  Chea Malan M.D on 05/05/2016 at 2:53 PM    Note: This dictation was prepared with Dragon dictation along with smaller phrase technology. Any transcriptional errors that result from this process are unintentional.

## 2016-05-05 NOTE — Care Management Important Message (Signed)
Important Message  Patient Details  Name: RAYDON KRITZMAN MRN: [id number] Date of Birth: 09-May-1945   Medicare Important Message Given:  Yes Signed notice obtained and given   Eber Hong, RN 05/05/2016, 5:05 PM

## 2016-05-07 ENCOUNTER — Other Ambulatory Visit: Payer: Self-pay

## 2016-05-07 ENCOUNTER — Encounter: Payer: Self-pay | Admitting: Family Medicine

## 2016-05-07 ENCOUNTER — Ambulatory Visit (INDEPENDENT_AMBULATORY_CARE_PROVIDER_SITE_OTHER): Payer: PPO | Admitting: Family Medicine

## 2016-05-07 VITALS — BP 122/70 | HR 86 | Temp 97.8°F | Resp 16 | Wt 269.2 lb

## 2016-05-07 DIAGNOSIS — H6123 Impacted cerumen, bilateral: Secondary | ICD-10-CM | POA: Diagnosis not present

## 2016-05-07 NOTE — Progress Notes (Signed)
Subjective:     Patient ID: Jay [name], male   DOB: 1945/04/19, 71 y.o.   MRN: [id number]  HPI  Chief Complaint  Patient presents with  . Cerumen Impaction    Patient comes in office today with complaints of decreased hearing and wax build up in both ears. Patient reports that he has been placing drops in his ear and the left ear seems to have opened up more now than the right.   Was recently hospitalized for CHF (3/30-05/06/16) and wax softener was placed with little improvement. Interestingly the hearing loss was bothering him more than the shortness of breath prior to his hospitalization.   Review of Systems     Objective:   Physical Exam  Constitutional: He appears well-developed and well-nourished. No distress.  HENT:  Bilateral cerumen obstruction. After irrigation per Kat: ear canals are patent, TM's intact and patient can hear at normal volume levels.       Assessment:    1. Obstruction of ventilation tube of both ears by cerumen - EAR CERUMEN REMOVAL    Plan:    Transition into care next week with his primary M.D., Dr. Sullivan Lone.

## 2016-05-07 NOTE — Patient Outreach (Signed)
[facility]) Care Management  05/07/2016  FELTON GARNET Feb 01, 1946 [id number]   Patient recently discharged from the hospital. Patient seen by Dr. Fidela Juneau of [facility]. Patient will be followed by primary physician for transition of care.  Primary physician office will refer to [facility] if appropriate after transition of care.    Plan: RN Health Coach will notify care management assistant of case status.   Bary Leriche, RN, MSN [facility] Care Management RN Telephonic Health Coach [phone]

## 2016-05-07 NOTE — Patient Instructions (Addendum)
Please follow up with Dr. Sullivan Lone as scheduled and with the heart failure clinic. Have your son help you get a pillbox so you know when to take your medication.

## 2016-05-11 ENCOUNTER — Ambulatory Visit: Payer: PPO | Admitting: Family Medicine

## 2016-05-12 ENCOUNTER — Telehealth: Payer: Self-pay | Admitting: Family Medicine

## 2016-05-12 ENCOUNTER — Telehealth: Payer: Self-pay | Admitting: Family

## 2016-05-12 ENCOUNTER — Emergency Department
Admission: EM | Admit: 2016-05-12 | Discharge: 2016-05-12 | Disposition: A | Discharge status: Home / Self Care | Payer: PPO | Attending: Emergency Medicine | Admitting: Emergency Medicine

## 2016-05-12 ENCOUNTER — Ambulatory Visit: Payer: PPO | Admitting: Family

## 2016-05-12 ENCOUNTER — Encounter: Payer: Self-pay | Admitting: Emergency Medicine

## 2016-05-12 ENCOUNTER — Inpatient Hospital Stay: Payer: PPO | Admitting: Family Medicine

## 2016-05-12 ENCOUNTER — Emergency Department: Payer: PPO

## 2016-05-12 DIAGNOSIS — I5023 Acute on chronic systolic (congestive) heart failure: Secondary | ICD-10-CM | POA: Diagnosis not present

## 2016-05-12 DIAGNOSIS — I2510 Atherosclerotic heart disease of native coronary artery without angina pectoris: Secondary | ICD-10-CM | POA: Diagnosis not present

## 2016-05-12 DIAGNOSIS — E1165 Type 2 diabetes mellitus with hyperglycemia: Secondary | ICD-10-CM | POA: Diagnosis not present

## 2016-05-12 DIAGNOSIS — Z79899 Other long term (current) drug therapy: Secondary | ICD-10-CM | POA: Diagnosis not present

## 2016-05-12 DIAGNOSIS — E1122 Type 2 diabetes mellitus with diabetic chronic kidney disease: Secondary | ICD-10-CM | POA: Insufficient documentation

## 2016-05-12 DIAGNOSIS — I130 Hypertensive heart and chronic kidney disease with heart failure and stage 1 through stage 4 chronic kidney disease, or unspecified chronic kidney disease: Secondary | ICD-10-CM | POA: Insufficient documentation

## 2016-05-12 DIAGNOSIS — N184 Chronic kidney disease, stage 4 (severe): Secondary | ICD-10-CM | POA: Diagnosis not present

## 2016-05-12 DIAGNOSIS — R410 Disorientation, unspecified: Secondary | ICD-10-CM | POA: Diagnosis not present

## 2016-05-12 DIAGNOSIS — Z794 Long term (current) use of insulin: Secondary | ICD-10-CM | POA: Diagnosis not present

## 2016-05-12 LAB — CBC
HEMATOCRIT: 33.8 % — AB (ref 40.0–52.0)
Hemoglobin: 11.2 g/dL — ABNORMAL LOW (ref 13.0–18.0)
MCH: 28.3 pg (ref 26.0–34.0)
MCHC: 33.1 g/dL (ref 32.0–36.0)
MCV: 85.5 fL (ref 80.0–100.0)
PLATELETS: 256 10*3/uL (ref 150–440)
RBC: 3.96 MIL/uL — ABNORMAL LOW (ref 4.40–5.90)
RDW: 16.1 % — AB (ref 11.5–14.5)
WBC: 8.7 10*3/uL (ref 3.8–10.6)

## 2016-05-12 LAB — BASIC METABOLIC PANEL
Anion gap: 8 (ref 5–15)
BUN: 21 mg/dL — ABNORMAL HIGH (ref 6–20)
CALCIUM: 8.7 mg/dL — AB (ref 8.9–10.3)
CO2: 28 mmol/L (ref 22–32)
Chloride: 97 mmol/L — ABNORMAL LOW (ref 101–111)
Creatinine, Ser: 0.97 mg/dL (ref 0.61–1.24)
GFR calc Af Amer: >60 mL/min (ref >60)
GLUCOSE: 246 mg/dL — AB (ref 65–99)
POTASSIUM: 3.5 mmol/L (ref 3.5–5.1)
Sodium: 133 mmol/L — ABNORMAL LOW (ref 135–145)

## 2016-05-12 LAB — COMPREHENSIVE METABOLIC PANEL
ALBUMIN: 3.2 g/dL — AB (ref 3.5–5.0)
ALT: 42 U/L (ref 17–63)
AST: 43 U/L — AB (ref 15–41)
Alkaline Phosphatase: 246 U/L — ABNORMAL HIGH (ref 38–126)
Anion gap: 8 (ref 5–15)
BILIRUBIN TOTAL: 1 mg/dL (ref 0.3–1.2)
BUN: 24 mg/dL — AB (ref 6–20)
CO2: 28 mmol/L (ref 22–32)
CREATININE: 1.06 mg/dL (ref 0.61–1.24)
Calcium: 8.8 mg/dL — ABNORMAL LOW (ref 8.9–10.3)
Chloride: 92 mmol/L — ABNORMAL LOW (ref 101–111)
GFR calc Af Amer: >60 mL/min (ref >60)
GLUCOSE: 499 mg/dL — AB (ref 65–99)
POTASSIUM: 4.2 mmol/L (ref 3.5–5.1)
Sodium: 128 mmol/L — ABNORMAL LOW (ref 135–145)
TOTAL PROTEIN: 7.3 g/dL (ref 6.5–8.1)

## 2016-05-12 LAB — URINALYSIS, COMPLETE (UACMP) WITH MICROSCOPIC
Bacteria, UA: NONE SEEN
Bilirubin Urine: NEGATIVE
Glucose, UA: >=500 mg/dL — AB
Hgb urine dipstick: NEGATIVE
Ketones, ur: NEGATIVE mg/dL
Leukocytes, UA: NEGATIVE
Nitrite: NEGATIVE
PROTEIN: 30 mg/dL — AB
RBC / HPF: NONE SEEN RBC/hpf (ref 0–5)
SPECIFIC GRAVITY, URINE: 1.021 (ref 1.005–1.030)
SQUAMOUS EPITHELIAL / LPF: NONE SEEN
pH: 7 (ref 5.0–8.0)

## 2016-05-12 LAB — URINE DRUG SCREEN, QUALITATIVE (ARMC ONLY)
AMPHETAMINES, UR SCREEN: NOT DETECTED
BARBITURATES, UR SCREEN: NOT DETECTED
Benzodiazepine, Ur Scrn: NOT DETECTED
CANNABINOID 50 NG, UR ~~LOC~~: NOT DETECTED
COCAINE METABOLITE, UR ~~LOC~~: NOT DETECTED
MDMA (ECSTASY) UR SCREEN: NOT DETECTED
Methadone Scn, Ur: NOT DETECTED
Opiate, Ur Screen: NOT DETECTED
Phencyclidine (PCP) Ur S: NOT DETECTED
TRICYCLIC, UR SCREEN: NOT DETECTED

## 2016-05-12 LAB — GLUCOSE, CAPILLARY
GLUCOSE-CAPILLARY: 363 mg/dL — AB (ref 65–99)
Glucose-Capillary: 447 mg/dL — ABNORMAL HIGH (ref 65–99)
Glucose-Capillary: 239 mg/dL — ABNORMAL HIGH (ref 65–99)

## 2016-05-12 LAB — BRAIN NATRIURETIC PEPTIDE: B NATRIURETIC PEPTIDE 5: 264 pg/mL — AB (ref 0.0–100.0)

## 2016-05-12 LAB — TROPONIN I

## 2016-05-12 MED ORDER — INSULIN ASPART 100 UNIT/ML ~~LOC~~ SOLN
10.0000 [IU] | Freq: Once | SUBCUTANEOUS | Status: AC
  Administered 2016-05-12: 10 [IU] via INTRAVENOUS
  Filled 2016-05-12: qty 10

## 2016-05-12 MED ORDER — INSULIN DETEMIR 100 UNIT/ML ~~LOC~~ SOLN
25.0000 [IU] | Freq: Once | SUBCUTANEOUS | Status: AC
  Administered 2016-05-12: 25 [IU] via SUBCUTANEOUS
  Filled 2016-05-12 (×2): qty 0.25

## 2016-05-12 MED ORDER — SODIUM CHLORIDE 0.9 % IV BOLUS (SEPSIS)
500.0000 mL | Freq: Once | INTRAVENOUS | Status: AC
  Administered 2016-05-12: 500 mL via INTRAVENOUS

## 2016-05-12 NOTE — Telephone Encounter (Signed)
Victorino Dike, patient's daughter in law, called and states patient has been more confused about things in the past week. Today for example her husband received a call to come and get the patient because he went to [place] to get his tires changed on his truck and then patient was calling his wife who is in [place] in a nursing home because patient did not know where he was and what he was doing. Son states patient has been having episodes of walking in the house like he is wandering around with no purpose. Spoke with Dr Sullivan Lone and per dr Sullivan Lone we can see patient and evaluste him now or go to ED. Advised daughter in law and patient;s son and because son and daughter in law not able to get off work and out of the class to bring the patient they will take him to ED tonight.-aa

## 2016-05-12 NOTE — Telephone Encounter (Signed)
Patient missed his initial appointment at the Heart Failure Clinic on 05/12/16. Will attempt to reschedule.

## 2016-05-12 NOTE — ED Triage Notes (Signed)
Patient presents to the ED with forgetting where he is or where he is going or what he is doing.  Patient's son states he was called today because the patient had driven to [employer] and was confused.  Patient states, "all I did today was lie down and take a nap."  Patient is oriented to self and place but not to time.  Patient's son states patient was hospitalized a little over a week ago and since he was discharged patient has been increasingly confused.  Patient generally gives himself insulin and keeps track of his own medications and drives but patient's son states he does have early stages of dementia but it's gotten much worse in the past week.

## 2016-05-12 NOTE — ED Provider Notes (Signed)
[facility] Emergency Department Provider Note  ____________________________________________  Time seen: Approximately 7:14 PM  I have reviewed the triage vital signs and the nursing notes.   HISTORY  Chief Complaint Altered Mental Status   HPI TREVONTE ASHKAR is a 71 y.o. male for history of dementia, coronary artery disease, morbidly obesity, hypertension, hyperlipidemia, CHF (EF of 55-60% on 6/17) who presents for evaluation of confusion. Patient was discharged from the hospital week ago when he presented with a CHF exacerbation. Patient was well at home for 2 days and started to get progressively more confused. Patient lives with his son, daughter-in-law, and grandson however given the patient has a history of dementia the family allows patient to manage his own medications at home. They do not know if patient has been taking his medications. Today patient drove himself to [place] and he was very confused which prompted his son to bring him to the hospital. No fevers at home, no cough, no vomiting, no diarrhea, normal appetite. Patient denies headache, neck stiffness, chest pain, shortness of breath, abdominal pain, nausea or dysuria. Patient tells me that he has been taking his medications however does not remember any of the events today.  Past Medical History:  Diagnosis Date  . Anemia   . Angina pectoris (HCC)   . Bilateral diabetic retinopathy (HCC)   . CAD (coronary artery disease)   . CHF (congestive heart failure) (HCC)   . Diabetes mellitus without complication (HCC)   . Fatty liver   . Foot deformities, congenital   . GERD (gastroesophageal reflux disease)   . Hyperlipidemia   . Hypertension   . Morbid obesity (HCC)   . Pedal edema   . Sinoatrial node dysfunction (HCC)   . Sleep apnea    not tolerating CPAP    Patient Active Problem List   Diagnosis Date Noted  . Acute on chronic systolic CHF (congestive heart failure) (HCC)  05/01/2016  . CHF exacerbation (HCC) 05/01/2016  . Congestive heart failure (CHF) (HCC) 03/18/2016  . Bilateral carpal tunnel syndrome 11/20/2015  . Encephalopathy, hepatic (HCC) 07/26/2015  . Hyperammonemia (HCC) 07/25/2015  . Type 2 diabetes mellitus with hyperglycemia (HCC) 07/25/2015  . Seizure (HCC)   . Uncontrolled type 2 diabetes mellitus with ketoacidotic coma, with long-term current use of insulin (HCC)   . Stroke (HCC) 07/22/2015  . Respiratory failure (HCC)   . Acute encephalopathy   . Unstable angina (HCC) 06/24/2015  . Wound infection 08/17/2014  . Acute kidney failure (HCC) 05/23/2014  . Actinic keratosis 05/23/2014  . Absolute anemia 05/23/2014  . CAD in native artery 05/23/2014  . Depression, major, recurrent (HCC) 05/23/2014  . Diabetic retinopathy (HCC) 05/23/2014  . Abnormal LFTs 05/23/2014  . Fatty infiltration of liver 05/23/2014  . Acid reflux 05/23/2014  . BP (high blood pressure) 05/23/2014  . HLD (hyperlipidemia) 05/23/2014  . Anemia, iron deficiency 05/23/2014  . Metal bone fixation hardware in place 05/23/2014  . Adiposity 05/23/2014  . Obstructive apnea 05/23/2014  . Plaque psoriasis 05/23/2014  . 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 AF (paroxysmal atrial fibrillation) (HCC) 03/01/2014  . Aortic heart valve narrowing 01/05/2014  . Chronic kidney disease (CKD), stage IV (severe) (HCC) 01/05/2014  . Benign essential HTN 01/05/2014    Past Surgical History:  Procedure Laterality Date  . CARDIAC CATHETERIZATION Left 07/10/2015   Procedure: Coronary Angiography;  Surgeon: Lamar Blinks, MD;  Location: [facility] INVASIVE CV LAB;  Service: Cardiovascular;  Laterality: Left;  .  CORONARY ANGIOPLASTY WITH STENT PLACEMENT  2006 and 2008    Prior to Admission medications   Medication Sig Start Date End Date Taking? Authorizing Provider  amLODipine (NORVASC) 5 MG tablet Take 5 mg by mouth daily.   Yes Historical Provider, MD  aspirin 81 MG tablet Take 1 tablet  by mouth daily. 12/01/10  Yes Historical Provider, MD  carbamide peroxide (DEBROX) 6.5 % otic solution Place 5 drops into both ears 3 (three) times daily. 05/05/16  Yes Katha Hamming, MD  carvedilol (COREG) 3.125 MG tablet Take 3.125 mg by mouth 2 (two) times daily.    Yes Historical Provider, MD  docusate sodium (COLACE) 100 MG capsule Take 1 capsule (100 mg total) by mouth 2 (two) times daily as needed for mild constipation. 05/05/16  Yes Katha Hamming, MD  donepezil (ARICEPT) 5 MG tablet Take 1 tablet (5 mg total) by mouth at bedtime. 10/08/15  Yes Bosco Hulen Shouts., MD  furosemide (LASIX) 40 MG tablet Take 1 tablet (40 mg total) by mouth 2 (two) times daily. 05/05/16  Yes Katha Hamming, MD  insulin detemir (LEVEMIR) 100 UNIT/ML injection Inject 0.25 mLs (25 Units total) into the skin 2 (two) times daily. 05/05/16  Yes Katha Hamming, MD  lacosamide (VIMPAT) 50 MG TABS tablet Take 1 tablet (50 mg total) by mouth 2 (two) times daily. 08/08/15  Yes Enid Baas, MD  meclizine (ANTIVERT) 25 MG tablet TAKE 1 TABLET BY MOUTH 3 TIMES DAILY AS NEEDED FOR DIZZINESS 08/08/15  Yes Enid Baas, MD  metolazone (ZAROXOLYN) 2.5 MG tablet Take 2.5 mg by mouth 2 (two) times daily.   Yes Historical Provider, MD  mometasone (ELOCON) 0.1 % cream Apply 1 application topically daily. For 2 weeks 03/26/14  Yes Historical Provider, MD  nitroGLYCERIN (NITROSTAT) 0.4 MG SL tablet Place 0.4 mg under the tongue every 5 (five) minutes x 3 doses as needed. 04/23/16  Yes Historical Provider, MD  ondansetron (ZOFRAN ODT) 8 MG disintegrating tablet Take 1 tablet (8 mg total) by mouth every 8 (eight) hours as needed for nausea or vomiting. 10/31/15  Yes Sharman Cheek, MD  potassium chloride (K-DUR) 10 MEQ tablet Take 1 tablet (10 mEq total) by mouth 2 (two) times daily. 04/07/16 07/06/16 Yes Trey Sailors, PA-C  sertraline (ZOLOFT) 50 MG tablet TAKE 1 TABLET BY MOUTH ONCE DAILY 03/24/16  Yes Maple Hudson., MD    Allergies Metformin  Family History  Problem Relation Age of Onset  . Leukemia Mother   . Cancer Father   . Deep vein thrombosis Father   . Psychiatric Illness Brother   . Heart disease Brother     Social History Social History  Substance Use Topics  . Smoking status: Never Smoker  . Smokeless tobacco: Never Used  . Alcohol use 0.0 oz/week     Comment: seldomly- 1 beer    Review of Systems  Constitutional: Negative for fever. + AMS Eyes: Negative for visual changes. ENT: Negative for sore throat. Neck: No neck pain  Cardiovascular: Negative for chest pain. Respiratory: Negative for shortness of breath. Gastrointestinal: Negative for abdominal pain, vomiting or diarrhea. Genitourinary: Negative for dysuria. Musculoskeletal: Negative for back pain. Skin: Negative for rash. Neurological: Negative for headaches, weakness or numbness. Psych: No SI or HI  ____________________________________________   PHYSICAL EXAM:  VITAL SIGNS: ED Triage Vitals  Enc Vitals Group     BP 05/12/16 1840 (!) 152/78     Pulse Rate 05/12/16 1840 76  Resp 05/12/16 1840 18     Temp 05/12/16 1840 97.5 F (36.4 C)     Temp Source 05/12/16 1840 Oral     SpO2 05/12/16 1840 90 %     Weight 05/12/16 1841 260 lb (117.9 kg)     Height 05/12/16 1841 5' 9.5" (1.765 m)     Head Circumference --      Peak Flow --      Pain Score --      Pain Loc --      Pain Edu? --      Excl. in GC? --     Constitutional: Alert and oriented. Well appearing and in no apparent distress. HEENT:      Head: Normocephalic and atraumatic.         Eyes: Conjunctivae are normal. Sclera is non-icteric. EOMI. PERRL      Mouth/Throat: Mucous membranes are moist.       Neck: Supple with no signs of meningismus. Cardiovascular: Regular rate and rhythm. V/VI systolic murmur loudest at the LUSB. No gallops, or rubs. 2+ symmetrical distal pulses are present in all extremities. No JVD. Respiratory: Normal  respiratory effort. Lungs are clear to auscultation bilaterally with diminished breath sounds on the b/l bases. No wheezes, crackles, or rhonchi.  Gastrointestinal: Soft, non tender, and non distended with positive bowel sounds. No rebound or guarding. Genitourinary: No CVA tenderness. Musculoskeletal: 2+ pitting edema on b/l LE Neurologic: Normal speech and language. Face is symmetric. Moving all extremities. No gross focal neurologic deficits are appreciated. Skin: Skin is warm, dry and intact. No rash noted. Psychiatric: Mood and affect are normal. Speech and behavior are normal.  ____________________________________________   LABS (all labs ordered are listed, but only abnormal results are displayed)  Labs Reviewed  COMPREHENSIVE METABOLIC PANEL - Abnormal; Notable for the following:       Result Value   Sodium 128 (*)    Chloride 92 (*)    Glucose, Bld 499 (*)    BUN 24 (*)    Calcium 8.8 (*)    Albumin 3.2 (*)    AST 43 (*)    Alkaline Phosphatase 246 (*)    All other components within normal limits  CBC - Abnormal; Notable for the following:    RBC 3.96 (*)    Hemoglobin 11.2 (*)    HCT 33.8 (*)    RDW 16.1 (*)    All other components within normal limits  GLUCOSE, CAPILLARY - Abnormal; Notable for the following:    Glucose-Capillary 447 (*)    All other components within normal limits  URINALYSIS, COMPLETE (UACMP) WITH MICROSCOPIC - Abnormal; Notable for the following:    Color, Urine YELLOW (*)    APPearance CLEAR (*)    Glucose, UA >=500 (*)    Protein, ur 30 (*)    All other components within normal limits  BRAIN NATRIURETIC PEPTIDE - Abnormal; Notable for the following:    B Natriuretic Peptide 264.0 (*)    All other components within normal limits  GLUCOSE, CAPILLARY - Abnormal; Notable for the following:    Glucose-Capillary 363 (*)    All other components within normal limits  BASIC METABOLIC PANEL - Abnormal; Notable for the following:    Sodium 133 (*)     Chloride 97 (*)    Glucose, Bld 246 (*)    BUN 21 (*)    Calcium 8.7 (*)    All other components within normal limits  GLUCOSE, CAPILLARY - Abnormal;  Notable for the following:    Glucose-Capillary 239 (*)    All other components within normal limits  TROPONIN I  URINE DRUG SCREEN, QUALITATIVE ([facility] ONLY)  CBG MONITORING, ED   ____________________________________________  EKG  ED ECG REPORT I, Nita Sickle, the attending physician, personally viewed and interpreted this ECG.  Normal sinus rhythm, rate of 71, normal intervals, normal axis, no ST elevations or depressions, nonspecific ST-T wave abnormalities. Unchanged from prior from 7 days ago  ____________________________________________  RADIOLOGY  CXR: No active disease. Chronic interstitial lung markings. Coronary artery stents. ____________________________________________   PROCEDURES  Procedure(s) performed: None Procedures Critical Care performed:  None ____________________________________________   INITIAL IMPRESSION / ASSESSMENT AND PLAN / ED COURSE  71 y.o. male for history of dementia, coronary artery disease, morbidly obesity, hypertension, hyperlipidemia, CHF (EF of 55-60% on 6/17) who presents for evaluation of progressively worsening confusion over the course of the week. Patient is well-appearing, in no distress, has normal vital signs, he does have 2+ pitting edema on bilateral lower extremities but no crackles or oxygen requirement. BG elevated at 445 probably due to medication non compliance in the setting of patient with dementia managing his own medications. Labs , UA pending. Patient is neuro intact with no signs of stroke.  I had a serious conversation with patient's son that the family needs to step up and start managing the patient's medications at home otherwise patient will keep returning to the ER. I also explained that if family is unable to care for patient that they need to work with  his PCP to find placement in a memory care unit or to hire home health nurse to administer patient's meds.   Clinical Course as of May 12 2300  Tue May 12, 2016  2257 Luckily patient is not in DKA. Labs improved after IVF and insulin. UA negative for UTI. Patient remains well appearing and slight confused which seems to be his baseline. He is tolerating PO. Has received his dose of levemir here which was communicated to his son. I again stressed the importance of son being responsible for patient's meds and also explained to him that too much or too little insulin can cause devastating medical problems which could lead to severe morbidity or even mortality for the patient. Explained to the son that an elderly patient with h/o dementia should not be allowed to take his meds by himself. I recommended that he contact patient's PCP in the am to help setup home health nurse or find placement for patient based on family's needs. Son will do that. Patient will be dc home now.  [CV]    Clinical Course User Index [CV] Nita Sickle, MD    Pertinent labs & imaging results that were available during my care of the patient were reviewed by me and considered in my medical decision making (see chart for details).    ____________________________________________   FINAL CLINICAL IMPRESSION(S) / ED DIAGNOSES  Final diagnoses:  Confusion  Type 2 diabetes mellitus with hyperglycemia, with long-term current use of insulin (HCC)      NEW MEDICATIONS STARTED DURING THIS VISIT:  New Prescriptions   No medications on file     Note:  This document was prepared using Dragon voice recognition software and may include unintentional dictation errors.    Nita Sickle, MD 05/12/16 2303

## 2016-05-12 NOTE — ED Notes (Signed)
Pt assisted to BR.

## 2016-05-12 NOTE — Discharge Instructions (Signed)
Please make sure the patient does not self administer any of his medications. This should be done by a family member or a Designer, fashion/clothing. He has already received his dose of insulin this evening and does not need another dose until tomorrow morning. Follow-up with his primary care doctor within the next 2 days. Return to the emergency room if he has chest pain, shortness of breath, fever, abdominal pain, or any new symptoms that are concerning to you.

## 2016-05-12 NOTE — ED Notes (Signed)
Patient's son expressing concerns about being able to provide adequate care for patient at home. Charge nurse made aware and states that she will obtain information and resources for patient and take them in to son. Son made aware and agreeable to plan.

## 2016-05-12 NOTE — ED Notes (Signed)
Jaycob Mcclenton RN called the pharmacy to get the Pt's medication.  

## 2016-05-14 ENCOUNTER — Emergency Department
Admission: EM | Admit: 2016-05-14 | Discharge: 2016-05-14 | Disposition: A | Discharge status: Home / Self Care | Payer: PPO | Attending: Emergency Medicine | Admitting: Emergency Medicine

## 2016-05-14 ENCOUNTER — Encounter: Payer: Self-pay | Admitting: Emergency Medicine

## 2016-05-14 ENCOUNTER — Encounter: Payer: Self-pay | Admitting: *Deleted

## 2016-05-14 ENCOUNTER — Other Ambulatory Visit: Payer: Self-pay | Admitting: *Deleted

## 2016-05-14 ENCOUNTER — Encounter: Payer: Self-pay | Admitting: Family Medicine

## 2016-05-14 ENCOUNTER — Telehealth: Payer: Self-pay

## 2016-05-14 ENCOUNTER — Ambulatory Visit (INDEPENDENT_AMBULATORY_CARE_PROVIDER_SITE_OTHER): Payer: PPO | Admitting: Family Medicine

## 2016-05-14 VITALS — BP 124/72 | HR 64 | Temp 98.5°F | Resp 18 | Wt 270.0 lb

## 2016-05-14 DIAGNOSIS — R569 Unspecified convulsions: Secondary | ICD-10-CM | POA: Diagnosis not present

## 2016-05-14 DIAGNOSIS — G300 Alzheimer's disease with early onset: Secondary | ICD-10-CM

## 2016-05-14 DIAGNOSIS — E1111 Type 2 diabetes mellitus with ketoacidosis with coma: Secondary | ICD-10-CM

## 2016-05-14 DIAGNOSIS — R197 Diarrhea, unspecified: Secondary | ICD-10-CM | POA: Diagnosis not present

## 2016-05-14 DIAGNOSIS — Z794 Long term (current) use of insulin: Secondary | ICD-10-CM

## 2016-05-14 DIAGNOSIS — G40909 Epilepsy, unspecified, not intractable, without status epilepticus: Secondary | ICD-10-CM | POA: Insufficient documentation

## 2016-05-14 DIAGNOSIS — I130 Hypertensive heart and chronic kidney disease with heart failure and stage 1 through stage 4 chronic kidney disease, or unspecified chronic kidney disease: Secondary | ICD-10-CM | POA: Insufficient documentation

## 2016-05-14 DIAGNOSIS — Z79899 Other long term (current) drug therapy: Secondary | ICD-10-CM | POA: Insufficient documentation

## 2016-05-14 DIAGNOSIS — E1165 Type 2 diabetes mellitus with hyperglycemia: Secondary | ICD-10-CM | POA: Insufficient documentation

## 2016-05-14 DIAGNOSIS — Z6841 Body Mass Index (BMI) 40.0 and over, adult: Secondary | ICD-10-CM | POA: Diagnosis not present

## 2016-05-14 DIAGNOSIS — I5023 Acute on chronic systolic (congestive) heart failure: Secondary | ICD-10-CM | POA: Diagnosis not present

## 2016-05-14 DIAGNOSIS — Z7982 Long term (current) use of aspirin: Secondary | ICD-10-CM | POA: Diagnosis not present

## 2016-05-14 DIAGNOSIS — E6609 Other obesity due to excess calories: Secondary | ICD-10-CM

## 2016-05-14 DIAGNOSIS — N184 Chronic kidney disease, stage 4 (severe): Secondary | ICD-10-CM | POA: Diagnosis not present

## 2016-05-14 DIAGNOSIS — IMO0001 Reserved for inherently not codable concepts without codable children: Secondary | ICD-10-CM

## 2016-05-14 DIAGNOSIS — R410 Disorientation, unspecified: Secondary | ICD-10-CM

## 2016-05-14 DIAGNOSIS — F0280 Dementia in other diseases classified elsewhere without behavioral disturbance: Secondary | ICD-10-CM

## 2016-05-14 DIAGNOSIS — I10 Essential (primary) hypertension: Secondary | ICD-10-CM | POA: Diagnosis not present

## 2016-05-14 DIAGNOSIS — E1122 Type 2 diabetes mellitus with diabetic chronic kidney disease: Secondary | ICD-10-CM | POA: Insufficient documentation

## 2016-05-14 DIAGNOSIS — R739 Hyperglycemia, unspecified: Secondary | ICD-10-CM

## 2016-05-14 DIAGNOSIS — I509 Heart failure, unspecified: Secondary | ICD-10-CM

## 2016-05-14 DIAGNOSIS — I2510 Atherosclerotic heart disease of native coronary artery without angina pectoris: Secondary | ICD-10-CM | POA: Diagnosis not present

## 2016-05-14 DIAGNOSIS — Z7984 Long term (current) use of oral hypoglycemic drugs: Secondary | ICD-10-CM | POA: Diagnosis not present

## 2016-05-14 DIAGNOSIS — R112 Nausea with vomiting, unspecified: Secondary | ICD-10-CM | POA: Diagnosis not present

## 2016-05-14 LAB — CBC WITH DIFFERENTIAL/PLATELET
BASOS ABS: 0.1 10*3/uL (ref 0–0.1)
BASOS PCT: 1 %
EOS ABS: 0.1 10*3/uL (ref 0–0.7)
EOS PCT: 1 %
HCT: 32.9 % — ABNORMAL LOW (ref 40.0–52.0)
Hemoglobin: 11.1 g/dL — ABNORMAL LOW (ref 13.0–18.0)
Lymphocytes Relative: 8 %
Lymphs Abs: 0.8 10*3/uL — ABNORMAL LOW (ref 1.0–3.6)
MCH: 28.2 pg (ref 26.0–34.0)
MCHC: 33.8 g/dL (ref 32.0–36.0)
MCV: 83.5 fL (ref 80.0–100.0)
MONO ABS: 0.9 10*3/uL (ref 0.2–1.0)
MONOS PCT: 9 %
Neutro Abs: 7.7 10*3/uL — ABNORMAL HIGH (ref 1.4–6.5)
Neutrophils Relative %: 81 %
PLATELETS: 251 10*3/uL (ref 150–440)
RBC: 3.94 MIL/uL — ABNORMAL LOW (ref 4.40–5.90)
RDW: 15.8 % — AB (ref 11.5–14.5)
WBC: 9.5 10*3/uL (ref 3.8–10.6)

## 2016-05-14 LAB — BLOOD GAS, VENOUS
ACID-BASE EXCESS: 3.9 mmol/L — AB (ref 0.0–2.0)
BICARBONATE: 30.5 mmol/L — AB (ref 20.0–28.0)
O2 Saturation: 82.5 %
PATIENT TEMPERATURE: 37
PH VEN: 7.36 (ref 7.250–7.430)
pCO2, Ven: 54 mmHg (ref 44.0–60.0)
pO2, Ven: 49 mmHg — ABNORMAL HIGH (ref 32.0–45.0)

## 2016-05-14 LAB — URINALYSIS, COMPLETE (UACMP) WITH MICROSCOPIC
Bacteria, UA: NONE SEEN
Bilirubin Urine: NEGATIVE
KETONES UR: NEGATIVE mg/dL
LEUKOCYTES UA: NEGATIVE
NITRITE: NEGATIVE
PH: 6 (ref 5.0–8.0)
Protein, ur: 100 mg/dL — AB
SPECIFIC GRAVITY, URINE: 1.023 (ref 1.005–1.030)
Squamous Epithelial / HPF: NONE SEEN

## 2016-05-14 LAB — BASIC METABOLIC PANEL
Anion gap: 7 (ref 5–15)
BUN: 18 mg/dL (ref 6–20)
CHLORIDE: 92 mmol/L — AB (ref 101–111)
CO2: 29 mmol/L (ref 22–32)
CREATININE: 0.9 mg/dL (ref 0.61–1.24)
Calcium: 8.9 mg/dL (ref 8.9–10.3)
GFR calc non Af Amer: >60 mL/min (ref >60)
GLUCOSE: 441 mg/dL — AB (ref 65–99)
Potassium: 4.3 mmol/L (ref 3.5–5.1)
Sodium: 128 mmol/L — ABNORMAL LOW (ref 135–145)

## 2016-05-14 LAB — GLUCOSE, CAPILLARY: GLUCOSE-CAPILLARY: 334 mg/dL — AB (ref 65–99)

## 2016-05-14 MED ORDER — SODIUM CHLORIDE 0.9 % IV BOLUS (SEPSIS)
1000.0000 mL | Freq: Once | INTRAVENOUS | Status: AC
  Administered 2016-05-14: 1000 mL via INTRAVENOUS

## 2016-05-14 MED ORDER — LACOSAMIDE 50 MG PO TABS
50.0000 mg | ORAL_TABLET | Freq: Two times a day (BID) | ORAL | Status: DC
  Administered 2016-05-14: 50 mg via ORAL
  Filled 2016-05-14: qty 1

## 2016-05-14 MED ORDER — INSULIN ASPART 100 UNIT/ML ~~LOC~~ SOLN
10.0000 [IU] | Freq: Once | SUBCUTANEOUS | Status: AC
  Administered 2016-05-14: 10 [IU] via INTRAVENOUS
  Filled 2016-05-14: qty 10

## 2016-05-14 NOTE — ED Triage Notes (Signed)
Pt comes into the ED via EMS from home c/o hyperglycemia and possible seizure like activity.  Patient was seen by his PCP earlier today where they state he had a 10-20 sec like seizure in the office today.  Patient's family later found him on the commode staring off into space and unable to answer them or follow commands.  Patient is alert and oriented and has even and unlabored respirations.

## 2016-05-14 NOTE — Patient Outreach (Signed)
[facility]) Care Management  05/14/2016  Jay [name] 1946/01/18 [id number]   EMMI-General Discharge RED ON EMMI ALERT DAY#: 4 DATE: 05/13/16 RED ALERT: Know who to call about changes in condition? No    Outreach attempt # 1 to patient. Spoke with patient and he is doing well. Patient denies any new problems/ concerns. He stated, he knows to call Dr. Sullivan Lone for any changes in his medical condition. He stated he or his wife makes his medical appointments and transport him to his medical appointments. Patient stated he or his wife helps with his medication management. He stated his wife is currently in the hospital and he will be visiting her soon. His son assist with patient's care as well. Per patient, he is forgetful at times. Patient voiced no RNCM concerns at this time.     Plan:  RNCM will notify [facility] administrative assistant of case status.  Wynelle Cleveland, RN, BSN, MHA/MSL, [facility] Telephonic Care Manager Coordinator [facility] Direct Phone: [phone] Toll Free: [phone] Fax: [phone]

## 2016-05-14 NOTE — Progress Notes (Signed)
Jay [name]  MRN: [id number] DOB: [birth year]  Subjective:  HPI  Patient is here with his son and daughter in law to discuss the issue with memory. Spoke with son and daughter in law on 05/12/16 and was told patient had to be picked up from.... Because patient did not know where he was and why he was there and was very confused. Patient was trying to go to [employer] store to get his tires changed.  Patient was taking to [facility] on 05/12/16 for confusion. Per notes from doctor at the hospital patient did not recall the events from that day. His glucose was at 445. UA was negative for Urinary tract infection. Per their note patietn was neuro intact at that time with no signs of stroke.  Son was advised that patient should not be managing medications by himself due to memory issue and confusion. Son was advised that they need to look into memory care unit or to hire a home health nurse to help with managing medications.  Patient is worse with his memory in the past 2 days. No wandering out of the house. Not trying to harm himself. Not driving. Patient Active Problem List   Diagnosis Date Noted  . Acute on chronic systolic CHF (congestive heart failure) (HCC) 05/01/2016  . CHF exacerbation (HCC) 05/01/2016  . Congestive heart failure (CHF) (HCC) 03/18/2016  . Bilateral carpal tunnel syndrome 11/20/2015  . Encephalopathy, hepatic (HCC) 07/26/2015  . Hyperammonemia (HCC) 07/25/2015  . Type 2 diabetes mellitus with hyperglycemia (HCC) 07/25/2015  . Seizure (HCC)   . Uncontrolled type 2 diabetes mellitus with ketoacidotic coma, with long-term current use of insulin (HCC)   . Stroke (HCC) 07/22/2015  . Respiratory failure (HCC)   . Acute encephalopathy   . Unstable angina (HCC) 06/24/2015  . Wound infection 08/17/2014  . Acute kidney failure (HCC) 05/23/2014  . Actinic keratosis 05/23/2014  . Absolute anemia 05/23/2014  . CAD in native artery 05/23/2014  . Depression, major,  recurrent (HCC) 05/23/2014  . Diabetic retinopathy (HCC) 05/23/2014  . Abnormal LFTs 05/23/2014  . Fatty infiltration of liver 05/23/2014  . Acid reflux 05/23/2014  . BP (high blood pressure) 05/23/2014  . HLD (hyperlipidemia) 05/23/2014  . Anemia, iron deficiency 05/23/2014  . Metal bone fixation hardware in place 05/23/2014  . Adiposity 05/23/2014  . Obstructive apnea 05/23/2014  . Plaque psoriasis 05/23/2014  . 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 AF (paroxysmal atrial fibrillation) (HCC) 03/01/2014  . Aortic heart valve narrowing 01/05/2014  . Chronic kidney disease (CKD), stage IV (severe) (HCC) 01/05/2014  . Benign essential HTN 01/05/2014    Past Medical History:  Diagnosis Date  . Anemia   . Angina pectoris (HCC)   . Bilateral diabetic retinopathy (HCC)   . CAD (coronary artery disease)   . CHF (congestive heart failure) (HCC)   . Diabetes mellitus without complication (HCC)   . Fatty liver   . Foot deformities, congenital   . GERD (gastroesophageal reflux disease)   . Hyperlipidemia   . Hypertension   . Morbid obesity (HCC)   . Pedal edema   . Sinoatrial node dysfunction (HCC)   . Sleep apnea    not tolerating CPAP    Social History   Social History  . Marital status: Married    Spouse name: N/A  . Number of children: N/A  . Years of education: N/A   Occupational History  . Not on file.   Social History Main Topics  .  Smoking status: Never Smoker  . Smokeless tobacco: Never Used  . Alcohol use 0.0 oz/week     Comment: seldomly- 1 beer  . Drug use: No  . Sexual activity: No   Other Topics Concern  . Not on file   Social History Narrative  . No narrative on file    Outpatient Encounter Prescriptions as of 05/14/2016  Medication Sig  . amLODipine (NORVASC) 5 MG tablet Take 5 mg by mouth daily.  Marland Kitchen aspirin 81 MG tablet Take 1 tablet by mouth daily.  . carbamide peroxide (DEBROX) 6.5 % otic solution Place 5 drops into both ears 3 (three) times  daily.  . carvedilol (COREG) 3.125 MG tablet Take 3.125 mg by mouth 2 (two) times daily.   Marland Kitchen docusate sodium (COLACE) 100 MG capsule Take 1 capsule (100 mg total) by mouth 2 (two) times daily as needed for mild constipation.  Marland Kitchen donepezil (ARICEPT) 5 MG tablet Take 1 tablet (5 mg total) by mouth at bedtime.  . furosemide (LASIX) 40 MG tablet Take 1 tablet (40 mg total) by mouth 2 (two) times daily.  . insulin detemir (LEVEMIR) 100 UNIT/ML injection Inject 0.25 mLs (25 Units total) into the skin 2 (two) times daily.  Marland Kitchen lacosamide (VIMPAT) 50 MG TABS tablet Take 1 tablet (50 mg total) by mouth 2 (two) times daily.  . meclizine (ANTIVERT) 25 MG tablet TAKE 1 TABLET BY MOUTH 3 TIMES DAILY AS NEEDED FOR DIZZINESS  . metolazone (ZAROXOLYN) 2.5 MG tablet Take 2.5 mg by mouth 2 (two) times daily.  . mometasone (ELOCON) 0.1 % cream Apply 1 application topically daily. For 2 weeks  . nitroGLYCERIN (NITROSTAT) 0.4 MG SL tablet Place 0.4 mg under the tongue every 5 (five) minutes x 3 doses as needed.  . ondansetron (ZOFRAN ODT) 8 MG disintegrating tablet Take 1 tablet (8 mg total) by mouth every 8 (eight) hours as needed for nausea or vomiting.  . potassium chloride (K-DUR) 10 MEQ tablet Take 1 tablet (10 mEq total) by mouth 2 (two) times daily.  . sertraline (ZOLOFT) 50 MG tablet TAKE 1 TABLET BY MOUTH ONCE DAILY   No facility-administered encounter medications on file as of 05/14/2016.     Allergies  Allergen Reactions  . Metformin Nausea Only    Review of Systems  Constitutional: Positive for malaise/fatigue.  Eyes: Negative.   Respiratory: Positive for shortness of breath.   Cardiovascular: Negative.   Gastrointestinal: Negative.   Musculoskeletal: Negative.   Skin: Negative.   Neurological: Negative for dizziness.  Endo/Heme/Allergies: Negative.   Psychiatric/Behavioral: Positive for memory loss.       Confusion, emotional, crying a lot    Objective:  BP 124/72   Pulse 64   Temp 98.5 F  (36.9 C)   Resp 18   Wt 270 lb (122.5 kg)   BMI 39.30 kg/m   Physical Exam  Constitutional: He is well-developed, well-nourished, and in no distress.  HENT:  Head: Normocephalic and atraumatic.  Eyes: Conjunctivae are normal.  Neck: Neck supple. No thyromegaly present.  Cardiovascular: Normal rate, regular rhythm and intact distal pulses.   Murmur heard.  Systolic murmur is present with a grade of 2/6  Pulmonary/Chest: Effort normal and breath sounds normal. No respiratory distress. He has no wheezes.  Abdominal: Soft.  Musculoskeletal: He exhibits edema (2+).  Neurological: He is alert. No cranial nerve deficit. Coordination normal.  Patient had seizure in the office that lasted for about 10 seconds, patient was looking/gazing out and up  to the right.  Skin: Skin is warm and dry.    Assessment and Plan :  1. Chronic congestive heart failure, unspecified congestive heart failure type (HCC)  2. 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 Discussed this in details with patient, patient's son and daughter in law. Advised them no more driving. Will place referral to home health and also referral to bariatrics.  3. Confusion Social work consult for placement options. 4. Uncontrolled type 2 diabetes mellitus with ketoacidotic coma, with long-term current use of insulin (HCC)  5. Benign essential HTN  6. CAD in native artery 7. Class 3 obesity due to excess calories with serious comorbidity and body mass index (BMI) of 40.0 to 44.9 in adult (HCC) 8. Seizure seizure was witnessed in the office today. Also advised patient needs to be seen Dr Malvin Johns to follow up. 9.Encephalopathy HPI, Exam and A&P transcribed by Samara Deist, RMA under direction and in the presence of Julieanne Manson, MD. I have done the exam and reviewed the chart and it is accurate to the best of my knowledge. Dentist has been used and  any errors in dictation or transcription are  unintentional. Julieanne Manson M.D. [facility]

## 2016-05-14 NOTE — Discharge Instructions (Signed)
Please seek medical attention for any high fevers, chest pain, shortness of breath, change in behavior, persistent vomiting, bloody stool or any other new or concerning symptoms.  

## 2016-05-14 NOTE — Telephone Encounter (Signed)
PT seeing Dr. Sullivan Lone today will call back when he knows if he should make an appt for his dad as they are trying to decide on SNF placement. States his dad is having trouble with his blood sugar and memory and confusion. So he was not sure if they would need an appointment here.   Clinic explained in length and benefits for the family, will mail informational letter if we do not hear back from the son mid week.   Son agreed to this, and states he will try to call us back if it is decided that they can benefit from coming.

## 2016-05-14 NOTE — ED Notes (Signed)
ED Provider at bedside. 

## 2016-05-14 NOTE — ED Provider Notes (Signed)
[facility] Emergency Department Provider Note   ____________________________________________   I have reviewed the triage vital signs and the nursing notes.   HISTORY  Chief Complaint Hyperglycemia and Seizures   History limited by: Not Limited   HPI Jay [name] is a 71 y.o. male who presents to the emergency department today via EMS because of concerns for seizure-like activity and high blood sugar. The pain is coming from home and they have noticed a couple seizure-like activities today. EMS thinks that the patient was slightly postictal none use having a hard time following commands. No sugars were in the 500s. Patient is unsure what his sugars normally run at. He denies any pain. No headache. No recent chest pain or shortness of breath.   Past Medical History:  Diagnosis Date  . Anemia   . Angina pectoris (HCC)   . Bilateral diabetic retinopathy (HCC)   . CAD (coronary artery disease)   . CHF (congestive heart failure) (HCC)   . Diabetes mellitus without complication (HCC)   . Fatty liver   . Foot deformities, congenital   . GERD (gastroesophageal reflux disease)   . Hyperlipidemia   . Hypertension   . Morbid obesity (HCC)   . Pedal edema   . Sinoatrial node dysfunction (HCC)   . Sleep apnea    not tolerating CPAP    Patient Active Problem List   Diagnosis Date Noted  . Acute on chronic systolic CHF (congestive heart failure) (HCC) 05/01/2016  . CHF exacerbation (HCC) 05/01/2016  . Congestive heart failure (CHF) (HCC) 03/18/2016  . Bilateral carpal tunnel syndrome 11/20/2015  . Encephalopathy, hepatic (HCC) 07/26/2015  . Hyperammonemia (HCC) 07/25/2015  . Type 2 diabetes mellitus with hyperglycemia (HCC) 07/25/2015  . Seizure (HCC)   . Uncontrolled type 2 diabetes mellitus with ketoacidotic coma, with long-term current use of insulin (HCC)   . Stroke (HCC) 07/22/2015  . Respiratory failure (HCC)   . Acute encephalopathy   .  Unstable angina (HCC) 06/24/2015  . Wound infection 08/17/2014  . Acute kidney failure (HCC) 05/23/2014  . Actinic keratosis 05/23/2014  . Absolute anemia 05/23/2014  . CAD in native artery 05/23/2014  . Depression, major, recurrent (HCC) 05/23/2014  . Diabetic retinopathy (HCC) 05/23/2014  . Abnormal LFTs 05/23/2014  . Fatty infiltration of liver 05/23/2014  . Acid reflux 05/23/2014  . BP (high blood pressure) 05/23/2014  . HLD (hyperlipidemia) 05/23/2014  . Anemia, iron deficiency 05/23/2014  . Metal bone fixation hardware in place 05/23/2014  . Adiposity 05/23/2014  . Obstructive apnea 05/23/2014  . Plaque psoriasis 05/23/2014  . 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 AF (paroxysmal atrial fibrillation) (HCC) 03/01/2014  . Aortic heart valve narrowing 01/05/2014  . Chronic kidney disease (CKD), stage IV (severe) (HCC) 01/05/2014  . Benign essential HTN 01/05/2014    Past Surgical History:  Procedure Laterality Date  . CARDIAC CATHETERIZATION Left 07/10/2015   Procedure: Coronary Angiography;  Surgeon: Lamar Blinks, MD;  Location: [facility] INVASIVE CV LAB;  Service: Cardiovascular;  Laterality: Left;  . CORONARY ANGIOPLASTY WITH STENT PLACEMENT  2006 and 2008    Prior to Admission medications   Medication Sig Start Date End Date Taking? Authorizing Provider  amLODipine (NORVASC) 5 MG tablet Take 5 mg by mouth daily.    Historical Provider, MD  aspirin 81 MG tablet Take 1 tablet by mouth daily. 12/01/10   Historical Provider, MD  carbamide peroxide (DEBROX) 6.5 % otic solution Place 5 drops into both ears 3 (  three) times daily. 05/05/16   Katha Hamming, MD  carvedilol (COREG) 3.125 MG tablet Take 3.125 mg by mouth 2 (two) times daily.     Historical Provider, MD  docusate sodium (COLACE) 100 MG capsule Take 1 capsule (100 mg total) by mouth 2 (two) times daily as needed for mild constipation. 05/05/16   Katha Hamming, MD  donepezil (ARICEPT) 5 MG tablet Take 1 tablet (5 mg  total) by mouth at bedtime. 10/08/15   Fynn Hulen Shouts., MD  furosemide (LASIX) 40 MG tablet Take 1 tablet (40 mg total) by mouth 2 (two) times daily. 05/05/16   Katha Hamming, MD  insulin detemir (LEVEMIR) 100 UNIT/ML injection Inject 0.25 mLs (25 Units total) into the skin 2 (two) times daily. 05/05/16   Katha Hamming, MD  lacosamide (VIMPAT) 50 MG TABS tablet Take 1 tablet (50 mg total) by mouth 2 (two) times daily. 08/08/15   Enid Baas, MD  meclizine (ANTIVERT) 25 MG tablet TAKE 1 TABLET BY MOUTH 3 TIMES DAILY AS NEEDED FOR DIZZINESS 08/08/15   Enid Baas, MD  metolazone (ZAROXOLYN) 2.5 MG tablet Take 2.5 mg by mouth 2 (two) times daily.    Historical Provider, MD  mometasone (ELOCON) 0.1 % cream Apply 1 application topically daily. For 2 weeks 03/26/14   Historical Provider, MD  nitroGLYCERIN (NITROSTAT) 0.4 MG SL tablet Place 0.4 mg under the tongue every 5 (five) minutes x 3 doses as needed. 04/23/16   Historical Provider, MD  ondansetron (ZOFRAN ODT) 8 MG disintegrating tablet Take 1 tablet (8 mg total) by mouth every 8 (eight) hours as needed for nausea or vomiting. 10/31/15   Sharman Cheek, MD  potassium chloride (K-DUR) 10 MEQ tablet Take 1 tablet (10 mEq total) by mouth 2 (two) times daily. 04/07/16 07/06/16  Trey Sailors, PA-C  sertraline (ZOLOFT) 50 MG tablet TAKE 1 TABLET BY MOUTH ONCE DAILY 03/24/16   Maple Hudson., MD    Allergies Metformin  Family History  Problem Relation Age of Onset  . Leukemia Mother   . Cancer Father   . Deep vein thrombosis Father   . Psychiatric Illness Brother   . Heart disease Brother     Social History Social History  Substance Use Topics  . Smoking status: Never Smoker  . Smokeless tobacco: Never Used  . Alcohol use 0.0 oz/week     Comment: seldomly- 1 beer    Review of Systems  Constitutional: Negative for fever. Cardiovascular: Negative for chest pain. Respiratory: Negative for shortness of  breath. Gastrointestinal: Negative for abdominal pain, vomiting and diarrhea. Genitourinary: Negative for dysuria. Musculoskeletal: Negative for back pain. Skin: Negative for rash. Neurological: Seizure like activity.   10-point ROS otherwise negative.  ____________________________________________   PHYSICAL EXAM:  VITAL SIGNS: ED Triage Vitals  Enc Vitals Group     BP 05/14/16 2048 (!) 175/75     Pulse Rate 05/14/16 2048 76     Resp 05/14/16 2048 15     Temp 05/14/16 2048 98.2 F (36.8 C)     Temp Source 05/14/16 2048 Oral     SpO2 05/14/16 2048 (!) 78 %     Weight 05/14/16 2049 270 lb (122.5 kg)     Height 05/14/16 2049 5' 9.5" (1.765 m)   Constitutional: Alert and oriented. Well appearing and in no distress. Eyes: Conjunctivae are normal. Normal extraocular movements. ENT   Head: Normocephalic and atraumatic.   Nose: No congestion/rhinnorhea.   Mouth/Throat: Mucous membranes are moist.  Neck: No stridor. Hematological/Lymphatic/Immunilogical: No cervical lymphadenopathy. Cardiovascular: Normal rate, regular rhythm.  No murmurs, rubs, or gallops.  Respiratory: Normal respiratory effort without tachypnea nor retractions. Breath sounds are clear and equal bilaterally. No wheezes/rales/rhonchi. Gastrointestinal: Soft and non tender. No rebound. No guarding.  Genitourinary: Deferred Musculoskeletal: Normal range of motion in all extremities. No lower extremity edema. Neurologic:  Normal speech and language. No gross focal neurologic deficits are appreciated.  Skin:  Skin is warm, dry and intact. No rash noted. Psychiatric: Mood and affect are normal. Speech and behavior are normal. Patient exhibits appropriate insight and judgment.  ____________________________________________    LABS (pertinent positives/negatives)  Labs Reviewed  BLOOD GAS, VENOUS - Abnormal; Notable for the following:       Result Value   pO2, Ven 49.0 (*)    Bicarbonate 30.5 (*)     Acid-Base Excess 3.9 (*)    All other components within normal limits  CBC WITH DIFFERENTIAL/PLATELET - Abnormal; Notable for the following:    RBC 3.94 (*)    Hemoglobin 11.1 (*)    HCT 32.9 (*)    RDW 15.8 (*)    Neutro Abs 7.7 (*)    Lymphs Abs 0.8 (*)    All other components within normal limits  URINALYSIS, COMPLETE (UACMP) WITH MICROSCOPIC - Abnormal; Notable for the following:    Color, Urine YELLOW (*)    APPearance CLEAR (*)    Glucose, UA >=500 (*)    Hgb urine dipstick SMALL (*)    Protein, ur 100 (*)    All other components within normal limits  BASIC METABOLIC PANEL - Abnormal; Notable for the following:    Sodium 128 (*)    Chloride 92 (*)    Glucose, Bld 441 (*)    All other components within normal limits     ____________________________________________   EKG  None  ____________________________________________    RADIOLOGY  None  ____________________________________________   PROCEDURES  Procedures  ____________________________________________   INITIAL IMPRESSION / ASSESSMENT AND PLAN / ED COURSE  Pertinent labs & imaging results that were available during my care of the patient were reviewed by me and considered in my medical decision making (see chart for details).  Patient presented to the emergency room today because of concerns for seizure and high blood sugar. Patient was recently seen in the emergency department for high blood sugar. There is some question about patient taking his medication. Will plan on checking blood work here to make sure patient is not in DKA. ----------------------------------------- 10:59 PM on 05/14/2016 ----------------------------------------- Patient does not appear to be in DKA. Had a discussion with the patient's family. It seems they have yet to start giving the patient his medications. Per documentation from last ER visit there was a discussion about the patient's medications being administered by  family. At this point I think the patient not taking his medication is likely the reason for both the elevated sugars and the seizure noted today. ____________________________________________   FINAL CLINICAL IMPRESSION(S) / ED DIAGNOSES  Final diagnoses:  Seizure disorder (HCC)  Hyperglycemia     Note: This dictation was prepared with Dragon dictation. Any transcriptional errors that result from this process are unintentional     Phineas Semen, MD 05/14/16 2312

## 2016-05-15 ENCOUNTER — Telehealth: Payer: Self-pay | Admitting: Family Medicine

## 2016-05-15 DIAGNOSIS — I10 Essential (primary) hypertension: Secondary | ICD-10-CM | POA: Diagnosis not present

## 2016-05-15 DIAGNOSIS — I5020 Unspecified systolic (congestive) heart failure: Secondary | ICD-10-CM | POA: Diagnosis not present

## 2016-05-15 DIAGNOSIS — G4089 Other seizures: Secondary | ICD-10-CM | POA: Diagnosis not present

## 2016-05-15 DIAGNOSIS — N183 Chronic kidney disease, stage 3 (moderate): Secondary | ICD-10-CM | POA: Diagnosis not present

## 2016-05-15 DIAGNOSIS — E785 Hyperlipidemia, unspecified: Secondary | ICD-10-CM | POA: Diagnosis not present

## 2016-05-15 DIAGNOSIS — I130 Hypertensive heart and chronic kidney disease with heart failure and stage 1 through stage 4 chronic kidney disease, or unspecified chronic kidney disease: Secondary | ICD-10-CM | POA: Diagnosis not present

## 2016-05-15 NOTE — Telephone Encounter (Signed)
Spoke with daughter in law for 20 minutes about pt. She and her husband took pt to the ER again last night because he was completley out of it and pick stuff out of the trash can and spiting in the trash can and saying he was trying to find something. The ER said that it was because of his high blood sugar. Went over medication list with daughter in law and she is going to call Dr. Malvin Johns for an appt today.

## 2016-05-15 NOTE — Telephone Encounter (Signed)
[facility] needs verbal orders for PT and speech Therapy.  The have completed his Home Health Eval.  Their cal back is [phone]  thnak sTeri

## 2016-05-15 NOTE — Telephone Encounter (Signed)
Pt's daughter Victorino Dike would like a nurse to return her call. Victorino Dike stated she brought pt into the office yesterday 05/14/16 and was advised to go home and look over pt's medication and to call back if she had any questions. Victorino Dike stated that she does have a few questions that she would like to go over with a nurse today if possible. Please advise. Thanks TNP

## 2016-05-16 ENCOUNTER — Ambulatory Visit: Payer: Self-pay | Admitting: Family Medicine

## 2016-05-16 NOTE — Telephone Encounter (Signed)
ok 

## 2016-05-18 NOTE — Telephone Encounter (Signed)
Advised  ED 

## 2016-05-19 ENCOUNTER — Telehealth: Payer: Self-pay | Admitting: Family Medicine

## 2016-05-19 DIAGNOSIS — I130 Hypertensive heart and chronic kidney disease with heart failure and stage 1 through stage 4 chronic kidney disease, or unspecified chronic kidney disease: Secondary | ICD-10-CM | POA: Diagnosis not present

## 2016-05-19 DIAGNOSIS — I10 Essential (primary) hypertension: Secondary | ICD-10-CM | POA: Diagnosis not present

## 2016-05-19 DIAGNOSIS — I5020 Unspecified systolic (congestive) heart failure: Secondary | ICD-10-CM | POA: Diagnosis not present

## 2016-05-19 DIAGNOSIS — N183 Chronic kidney disease, stage 3 (moderate): Secondary | ICD-10-CM | POA: Diagnosis not present

## 2016-05-19 DIAGNOSIS — G4089 Other seizures: Secondary | ICD-10-CM | POA: Diagnosis not present

## 2016-05-19 DIAGNOSIS — E785 Hyperlipidemia, unspecified: Secondary | ICD-10-CM | POA: Diagnosis not present

## 2016-05-19 NOTE — Telephone Encounter (Signed)
Pt's daughter in law called wanting to know if he (the pat) is supposes to be seeing a geriatrics dr. Quincy Carnes were under the impression to see a geriatrics dr. Or a neurologist.  Her call back is [phone]  Thanks Barth Kirks

## 2016-05-19 NOTE — Telephone Encounter (Signed)
Neurology first but both might be helpful.

## 2016-05-19 NOTE — Telephone Encounter (Signed)
Patient needs to see both neurologist and geriatric correct?-aa

## 2016-05-20 ENCOUNTER — Ambulatory Visit: Payer: PPO | Admitting: Family Medicine

## 2016-05-21 ENCOUNTER — Telehealth: Payer: Self-pay | Admitting: Family Medicine

## 2016-05-21 DIAGNOSIS — R410 Disorientation, unspecified: Secondary | ICD-10-CM | POA: Diagnosis not present

## 2016-05-21 DIAGNOSIS — R569 Unspecified convulsions: Secondary | ICD-10-CM | POA: Diagnosis not present

## 2016-05-21 DIAGNOSIS — R202 Paresthesia of skin: Secondary | ICD-10-CM | POA: Diagnosis not present

## 2016-05-21 DIAGNOSIS — R200 Anesthesia of skin: Secondary | ICD-10-CM | POA: Diagnosis not present

## 2016-05-21 NOTE — Telephone Encounter (Signed)
Dr Jay [name], Jay [name] from [facility] said that the patient was seen by Dr Jay [name] today.  No changes were made in his medications.  Jay [name] and the family are concerned because of dramatic weight changes and Jay [name] also wanted [place] to be aware of the significant difference in scores of his MMSE .     Weights have been: 05/21/16 243 Dr Jay [name] 05/14/16 270 [name] 05/04/16   300 [name] 05/02/16 306 Hospital 05/01/16 320 Hospital 04/09/16   285 [name] would like to get something to increase his appetite.  She and the family report that he will start to eat and then forget to finish and do something else, most of the time go to sleep.  MMSE  05/21/16 20/30  Dr Jay [name] 05/21/16 10/30 Jay [name] 05/14/16 24/30 [name] reports that his eyes have been glazy and she has yet to get him to tell her anything other than who he is and his personal information.  He can not tell her where he lives.  His blood sugar has been ranging 170-500's

## 2016-05-21 NOTE — Telephone Encounter (Signed)
Cut back on zaroxoly to q am for weight loss. SW consult should be  in place for placement.

## 2016-05-21 NOTE — Telephone Encounter (Signed)
[name] with [facility] called saying she would like a nurse to call her back.  She said the family has reported to her that in 30 days he has lost 50 lbs.  The family wants to place him into a care home.  Decline in memory also.  Jay [name]

## 2016-05-21 NOTE — Telephone Encounter (Signed)
Advised  ED 

## 2016-05-22 ENCOUNTER — Telehealth: Payer: Self-pay | Admitting: Family Medicine

## 2016-05-22 DIAGNOSIS — Z111 Encounter for screening for respiratory tuberculosis: Secondary | ICD-10-CM

## 2016-05-22 NOTE — Telephone Encounter (Signed)
Discussed the changes needed with Melody the memory care coordinator that is assessing the patient for placement-aa FL2 form re filled out-aa

## 2016-05-22 NOTE — Telephone Encounter (Signed)
Dr Sullivan Lone per the note below they wanted to also try something to help with his appetite, please review-aa

## 2016-05-22 NOTE — Telephone Encounter (Signed)
Victorino Dike the pt's daughterin law called saying fl2 form has some corrections that need to be made in order for him to get into memory care.  1st. Diagnosis should be dementia and attendinf phy needs to be completed.  Her call bajk is [phone]  Thanks Barth Kirks

## 2016-05-25 DIAGNOSIS — Z111 Encounter for screening for respiratory tuberculosis: Secondary | ICD-10-CM | POA: Diagnosis not present

## 2016-05-25 NOTE — Addendum Note (Signed)
Addended by: Miachel Roux on: 05/25/2016 10:46 AM   Modules accepted: Orders

## 2016-05-25 NOTE — Telephone Encounter (Signed)
Not presently. Lets see where weight stabilizes.

## 2016-05-26 NOTE — Telephone Encounter (Signed)
Paqtient seen in office since this message.

## 2016-05-27 ENCOUNTER — Telehealth: Payer: Self-pay | Admitting: Family Medicine

## 2016-05-27 LAB — QUANTIFERON IN TUBE
QFT TB AG MINUS NIL VALUE: 0 IU/mL
QUANTIFERON MITOGEN VALUE: 6.91 IU/mL
QUANTIFERON NIL VALUE: 0.02 [IU]/mL
QUANTIFERON TB AG VALUE: 0.02 IU/mL
QUANTIFERON TB GOLD: NEGATIVE

## 2016-05-27 LAB — QUANTIFERON TB GOLD ASSAY (BLOOD)

## 2016-05-27 NOTE — Telephone Encounter (Signed)
Victorino Dike pt daughter in law is requesting a call back.  She states she needs to discuss pt medications.  CB#[phone]/MW

## 2016-05-28 DIAGNOSIS — G4089 Other seizures: Secondary | ICD-10-CM | POA: Diagnosis not present

## 2016-05-28 DIAGNOSIS — E785 Hyperlipidemia, unspecified: Secondary | ICD-10-CM | POA: Diagnosis not present

## 2016-05-28 DIAGNOSIS — I5020 Unspecified systolic (congestive) heart failure: Secondary | ICD-10-CM | POA: Diagnosis not present

## 2016-05-28 DIAGNOSIS — N183 Chronic kidney disease, stage 3 (moderate): Secondary | ICD-10-CM | POA: Diagnosis not present

## 2016-05-28 DIAGNOSIS — I10 Essential (primary) hypertension: Secondary | ICD-10-CM | POA: Diagnosis not present

## 2016-05-28 DIAGNOSIS — I130 Hypertensive heart and chronic kidney disease with heart failure and stage 1 through stage 4 chronic kidney disease, or unspecified chronic kidney disease: Secondary | ICD-10-CM | POA: Diagnosis not present

## 2016-05-28 NOTE — Telephone Encounter (Signed)
Spoke with Jennifer-daughter in Social worker. She states patient saw Dr Malvin Johns the other day but Victorino Dike was not there at that office visit and medication list they printed as a summary does not match what we have on our file. Also Dr Gwen Pounds told them that patient should not be on Carvedilol which is on our list but to take labetalol. I advised jennifer to write down what medications they have for patient at home that he is taking -all of it and then bring that by and the medication list from Dr Malvin Johns and that I would check with Dr Sullivan Lone and see if we can figure this out or if we need to call Dr Gwen Pounds and ask him too. Victorino Dike understood and agreed, waiting for this information to be brought in-aa

## 2016-06-02 DIAGNOSIS — I350 Nonrheumatic aortic (valve) stenosis: Secondary | ICD-10-CM | POA: Diagnosis not present

## 2016-06-02 DIAGNOSIS — G4733 Obstructive sleep apnea (adult) (pediatric): Secondary | ICD-10-CM | POA: Diagnosis not present

## 2016-06-02 DIAGNOSIS — I10 Essential (primary) hypertension: Secondary | ICD-10-CM | POA: Diagnosis not present

## 2016-06-02 DIAGNOSIS — I952 Hypotension due to drugs: Secondary | ICD-10-CM | POA: Diagnosis not present

## 2016-06-02 DIAGNOSIS — E782 Mixed hyperlipidemia: Secondary | ICD-10-CM | POA: Diagnosis not present

## 2016-06-02 DIAGNOSIS — I2510 Atherosclerotic heart disease of native coronary artery without angina pectoris: Secondary | ICD-10-CM | POA: Diagnosis not present

## 2016-06-02 DIAGNOSIS — I5032 Chronic diastolic (congestive) heart failure: Secondary | ICD-10-CM | POA: Diagnosis not present

## 2016-06-02 DIAGNOSIS — R600 Localized edema: Secondary | ICD-10-CM | POA: Diagnosis not present

## 2016-06-02 DIAGNOSIS — I480 Paroxysmal atrial fibrillation: Secondary | ICD-10-CM | POA: Diagnosis not present

## 2016-06-02 DIAGNOSIS — R0602 Shortness of breath: Secondary | ICD-10-CM | POA: Diagnosis not present

## 2016-06-03 ENCOUNTER — Telehealth: Payer: Self-pay | Admitting: Family Medicine

## 2016-06-03 DIAGNOSIS — E785 Hyperlipidemia, unspecified: Secondary | ICD-10-CM | POA: Diagnosis not present

## 2016-06-03 DIAGNOSIS — I130 Hypertensive heart and chronic kidney disease with heart failure and stage 1 through stage 4 chronic kidney disease, or unspecified chronic kidney disease: Secondary | ICD-10-CM | POA: Diagnosis not present

## 2016-06-03 DIAGNOSIS — I5020 Unspecified systolic (congestive) heart failure: Secondary | ICD-10-CM | POA: Diagnosis not present

## 2016-06-03 DIAGNOSIS — I10 Essential (primary) hypertension: Secondary | ICD-10-CM | POA: Diagnosis not present

## 2016-06-03 DIAGNOSIS — N183 Chronic kidney disease, stage 3 (moderate): Secondary | ICD-10-CM | POA: Diagnosis not present

## 2016-06-03 DIAGNOSIS — G4089 Other seizures: Secondary | ICD-10-CM | POA: Diagnosis not present

## 2016-06-03 NOTE — Telephone Encounter (Signed)
Pt daughter in law, Victorino Dike is requesting a copy of pt TB skin test that was done last week.  She states she will need to pick this up tomorrow at lunch.  CB#[phone]/MW

## 2016-06-04 DIAGNOSIS — N183 Chronic kidney disease, stage 3 (moderate): Secondary | ICD-10-CM | POA: Diagnosis not present

## 2016-06-04 DIAGNOSIS — I10 Essential (primary) hypertension: Secondary | ICD-10-CM | POA: Diagnosis not present

## 2016-06-04 DIAGNOSIS — E785 Hyperlipidemia, unspecified: Secondary | ICD-10-CM | POA: Diagnosis not present

## 2016-06-04 DIAGNOSIS — G4089 Other seizures: Secondary | ICD-10-CM | POA: Diagnosis not present

## 2016-06-04 DIAGNOSIS — I5020 Unspecified systolic (congestive) heart failure: Secondary | ICD-10-CM | POA: Diagnosis not present

## 2016-06-04 DIAGNOSIS — I130 Hypertensive heart and chronic kidney disease with heart failure and stage 1 through stage 4 chronic kidney disease, or unspecified chronic kidney disease: Secondary | ICD-10-CM | POA: Diagnosis not present

## 2016-06-04 NOTE — Telephone Encounter (Signed)
Victorino Dike advised, copy placed up front-aa

## 2016-06-09 ENCOUNTER — Telehealth: Payer: Self-pay

## 2016-06-09 NOTE — Telephone Encounter (Signed)
Please review-aa 

## 2016-06-09 NOTE — Telephone Encounter (Signed)
Can we work him in tomorrow to check toe and to update tetanus?

## 2016-06-09 NOTE — Telephone Encounter (Signed)
Spoke with Jay [name] and daughter in Social worker [place]. Jay [name] states bleeding has stopped and she wrapped up the toe. [place] is in [place] and Dr Sullivan Lone advised of that he did not realize that. Patient does not need to be seen and can wait till Thursday 06/11/16 to see the in house doctor about this and to get established with that doctor for them to take care of the patient. If patient can not come in and see [place] for follow up patient needs to see the doctor there. Also advised Shannon and Td needs to be updated. Jay [name] understood and will advise the doctor there.-aa

## 2016-06-09 NOTE — Telephone Encounter (Signed)
Jay [name] from the [facility] had called because at approximately 4:05 PM patient had slept and cutt his big toe while in shower. Ms. Jay [name]  States on the phone that skin did peel back from toe and bled for some time but has now subsided. She states that she has concerns because patient is a diabetic and wasn't sure if there was anything specific that you wanted to be done for patient. I looked through patients chart and last recorded Td I saw was 2002 no Tdap documented, please review chart and advised. KW

## 2016-06-11 DIAGNOSIS — I5022 Chronic systolic (congestive) heart failure: Secondary | ICD-10-CM | POA: Diagnosis not present

## 2016-06-11 DIAGNOSIS — L218 Other seborrheic dermatitis: Secondary | ICD-10-CM | POA: Diagnosis not present

## 2016-06-11 DIAGNOSIS — I10 Essential (primary) hypertension: Secondary | ICD-10-CM | POA: Diagnosis not present

## 2016-06-11 DIAGNOSIS — G300 Alzheimer's disease with early onset: Secondary | ICD-10-CM | POA: Diagnosis not present

## 2016-06-14 DIAGNOSIS — E1165 Type 2 diabetes mellitus with hyperglycemia: Secondary | ICD-10-CM | POA: Diagnosis not present

## 2016-06-14 DIAGNOSIS — N179 Acute kidney failure, unspecified: Secondary | ICD-10-CM | POA: Diagnosis not present

## 2016-06-14 DIAGNOSIS — E871 Hypo-osmolality and hyponatremia: Secondary | ICD-10-CM | POA: Diagnosis not present

## 2016-06-14 DIAGNOSIS — L89152 Pressure ulcer of sacral region, stage 2: Secondary | ICD-10-CM | POA: Diagnosis not present

## 2016-06-14 DIAGNOSIS — J189 Pneumonia, unspecified organism: Secondary | ICD-10-CM | POA: Diagnosis not present

## 2016-06-14 DIAGNOSIS — S098XXA Other specified injuries of head, initial encounter: Secondary | ICD-10-CM | POA: Diagnosis not present

## 2016-06-14 DIAGNOSIS — E876 Hypokalemia: Secondary | ICD-10-CM | POA: Diagnosis not present

## 2016-06-14 DIAGNOSIS — E669 Obesity, unspecified: Secondary | ICD-10-CM | POA: Diagnosis not present

## 2016-06-14 DIAGNOSIS — W19XXXA Unspecified fall, initial encounter: Secondary | ICD-10-CM | POA: Diagnosis not present

## 2016-06-14 DIAGNOSIS — J9601 Acute respiratory failure with hypoxia: Secondary | ICD-10-CM | POA: Diagnosis not present

## 2016-06-14 DIAGNOSIS — S72002A Fracture of unspecified part of neck of left femur, initial encounter for closed fracture: Secondary | ICD-10-CM | POA: Diagnosis not present

## 2016-06-14 DIAGNOSIS — W1839XA Other fall on same level, initial encounter: Secondary | ICD-10-CM | POA: Diagnosis not present

## 2016-06-14 DIAGNOSIS — Y92129 Unspecified place in nursing home as the place of occurrence of the external cause: Secondary | ICD-10-CM | POA: Diagnosis not present

## 2016-06-14 DIAGNOSIS — S72042A Displaced fracture of base of neck of left femur, initial encounter for closed fracture: Secondary | ICD-10-CM | POA: Diagnosis not present

## 2016-06-14 DIAGNOSIS — N189 Chronic kidney disease, unspecified: Secondary | ICD-10-CM | POA: Diagnosis not present

## 2016-06-14 DIAGNOSIS — M25552 Pain in left hip: Secondary | ICD-10-CM | POA: Diagnosis not present

## 2016-06-14 DIAGNOSIS — I130 Hypertensive heart and chronic kidney disease with heart failure and stage 1 through stage 4 chronic kidney disease, or unspecified chronic kidney disease: Secondary | ICD-10-CM | POA: Diagnosis not present

## 2016-06-14 DIAGNOSIS — E872 Acidosis: Secondary | ICD-10-CM | POA: Diagnosis not present

## 2016-06-14 DIAGNOSIS — E1122 Type 2 diabetes mellitus with diabetic chronic kidney disease: Secondary | ICD-10-CM | POA: Diagnosis not present

## 2016-06-14 DIAGNOSIS — I2510 Atherosclerotic heart disease of native coronary artery without angina pectoris: Secondary | ICD-10-CM | POA: Diagnosis not present

## 2016-06-14 DIAGNOSIS — I509 Heart failure, unspecified: Secondary | ICD-10-CM | POA: Diagnosis not present

## 2016-06-14 DIAGNOSIS — A419 Sepsis, unspecified organism: Secondary | ICD-10-CM | POA: Diagnosis not present

## 2016-06-14 DIAGNOSIS — R6520 Severe sepsis without septic shock: Secondary | ICD-10-CM | POA: Diagnosis not present

## 2016-06-14 DIAGNOSIS — F0390 Unspecified dementia without behavioral disturbance: Secondary | ICD-10-CM | POA: Diagnosis not present

## 2016-06-15 DIAGNOSIS — E871 Hypo-osmolality and hyponatremia: Secondary | ICD-10-CM | POA: Diagnosis not present

## 2016-06-15 DIAGNOSIS — N179 Acute kidney failure, unspecified: Secondary | ICD-10-CM | POA: Diagnosis not present

## 2016-06-15 DIAGNOSIS — J9601 Acute respiratory failure with hypoxia: Secondary | ICD-10-CM | POA: Diagnosis not present

## 2016-06-15 DIAGNOSIS — E872 Acidosis: Secondary | ICD-10-CM | POA: Diagnosis not present

## 2016-06-15 DIAGNOSIS — I130 Hypertensive heart and chronic kidney disease with heart failure and stage 1 through stage 4 chronic kidney disease, or unspecified chronic kidney disease: Secondary | ICD-10-CM | POA: Diagnosis not present

## 2016-06-15 DIAGNOSIS — A419 Sepsis, unspecified organism: Secondary | ICD-10-CM | POA: Diagnosis not present

## 2016-06-15 DIAGNOSIS — R6520 Severe sepsis without septic shock: Secondary | ICD-10-CM | POA: Diagnosis not present

## 2016-06-15 DIAGNOSIS — S72002A Fracture of unspecified part of neck of left femur, initial encounter for closed fracture: Secondary | ICD-10-CM | POA: Diagnosis not present

## 2016-06-15 DIAGNOSIS — J189 Pneumonia, unspecified organism: Secondary | ICD-10-CM | POA: Diagnosis not present

## 2016-06-17 DIAGNOSIS — S72002A Fracture of unspecified part of neck of left femur, initial encounter for closed fracture: Secondary | ICD-10-CM | POA: Diagnosis not present

## 2016-06-18 DIAGNOSIS — S72002A Fracture of unspecified part of neck of left femur, initial encounter for closed fracture: Secondary | ICD-10-CM | POA: Diagnosis not present

## 2016-06-18 DIAGNOSIS — J9601 Acute respiratory failure with hypoxia: Secondary | ICD-10-CM | POA: Diagnosis not present

## 2016-06-18 DIAGNOSIS — A419 Sepsis, unspecified organism: Secondary | ICD-10-CM | POA: Diagnosis not present

## 2016-06-18 DIAGNOSIS — E872 Acidosis: Secondary | ICD-10-CM | POA: Diagnosis not present

## 2016-06-18 DIAGNOSIS — N179 Acute kidney failure, unspecified: Secondary | ICD-10-CM | POA: Diagnosis not present

## 2016-06-18 DIAGNOSIS — E871 Hypo-osmolality and hyponatremia: Secondary | ICD-10-CM | POA: Diagnosis not present

## 2016-06-18 DIAGNOSIS — J189 Pneumonia, unspecified organism: Secondary | ICD-10-CM | POA: Diagnosis not present

## 2016-06-18 DIAGNOSIS — I130 Hypertensive heart and chronic kidney disease with heart failure and stage 1 through stage 4 chronic kidney disease, or unspecified chronic kidney disease: Secondary | ICD-10-CM | POA: Diagnosis not present

## 2016-06-18 DIAGNOSIS — R6520 Severe sepsis without septic shock: Secondary | ICD-10-CM | POA: Diagnosis not present

## 2016-06-20 DIAGNOSIS — R6520 Severe sepsis without septic shock: Secondary | ICD-10-CM | POA: Diagnosis not present

## 2016-06-20 DIAGNOSIS — I130 Hypertensive heart and chronic kidney disease with heart failure and stage 1 through stage 4 chronic kidney disease, or unspecified chronic kidney disease: Secondary | ICD-10-CM | POA: Diagnosis not present

## 2016-06-20 DIAGNOSIS — S72002A Fracture of unspecified part of neck of left femur, initial encounter for closed fracture: Secondary | ICD-10-CM | POA: Diagnosis not present

## 2016-06-20 DIAGNOSIS — N179 Acute kidney failure, unspecified: Secondary | ICD-10-CM | POA: Diagnosis not present

## 2016-06-20 DIAGNOSIS — E872 Acidosis: Secondary | ICD-10-CM | POA: Diagnosis not present

## 2016-06-20 DIAGNOSIS — E871 Hypo-osmolality and hyponatremia: Secondary | ICD-10-CM | POA: Diagnosis not present

## 2016-06-20 DIAGNOSIS — J189 Pneumonia, unspecified organism: Secondary | ICD-10-CM | POA: Diagnosis not present

## 2016-06-20 DIAGNOSIS — A419 Sepsis, unspecified organism: Secondary | ICD-10-CM | POA: Diagnosis not present

## 2016-06-20 DIAGNOSIS — J9601 Acute respiratory failure with hypoxia: Secondary | ICD-10-CM | POA: Diagnosis not present

## 2016-06-22 DIAGNOSIS — N189 Chronic kidney disease, unspecified: Secondary | ICD-10-CM | POA: Diagnosis not present

## 2016-06-22 DIAGNOSIS — R279 Unspecified lack of coordination: Secondary | ICD-10-CM | POA: Diagnosis not present

## 2016-06-22 DIAGNOSIS — F0280 Dementia in other diseases classified elsewhere without behavioral disturbance: Secondary | ICD-10-CM | POA: Diagnosis not present

## 2016-06-22 DIAGNOSIS — R41841 Cognitive communication deficit: Secondary | ICD-10-CM | POA: Diagnosis not present

## 2016-06-22 DIAGNOSIS — R2689 Other abnormalities of gait and mobility: Secondary | ICD-10-CM | POA: Diagnosis not present

## 2016-06-22 DIAGNOSIS — R269 Unspecified abnormalities of gait and mobility: Secondary | ICD-10-CM | POA: Diagnosis not present

## 2016-06-22 DIAGNOSIS — S7292XD Unspecified fracture of left femur, subsequent encounter for closed fracture with routine healing: Secondary | ICD-10-CM | POA: Diagnosis not present

## 2016-06-22 DIAGNOSIS — E7220 Disorder of urea cycle metabolism, unspecified: Secondary | ICD-10-CM | POA: Diagnosis not present

## 2016-06-22 DIAGNOSIS — M6281 Muscle weakness (generalized): Secondary | ICD-10-CM | POA: Diagnosis not present

## 2016-06-22 DIAGNOSIS — I10 Essential (primary) hypertension: Secondary | ICD-10-CM | POA: Diagnosis not present

## 2016-06-22 DIAGNOSIS — J069 Acute upper respiratory infection, unspecified: Secondary | ICD-10-CM | POA: Diagnosis not present

## 2016-06-22 DIAGNOSIS — M25552 Pain in left hip: Secondary | ICD-10-CM | POA: Diagnosis not present

## 2016-06-22 DIAGNOSIS — F339 Major depressive disorder, recurrent, unspecified: Secondary | ICD-10-CM | POA: Diagnosis not present

## 2016-06-22 DIAGNOSIS — E0844 Diabetes mellitus due to underlying condition with diabetic amyotrophy: Secondary | ICD-10-CM | POA: Diagnosis not present

## 2016-06-22 DIAGNOSIS — F411 Generalized anxiety disorder: Secondary | ICD-10-CM | POA: Diagnosis not present

## 2016-06-22 DIAGNOSIS — I639 Cerebral infarction, unspecified: Secondary | ICD-10-CM | POA: Diagnosis not present

## 2016-06-22 DIAGNOSIS — R1310 Dysphagia, unspecified: Secondary | ICD-10-CM | POA: Diagnosis not present

## 2016-06-22 DIAGNOSIS — I2510 Atherosclerotic heart disease of native coronary artery without angina pectoris: Secondary | ICD-10-CM | POA: Diagnosis not present

## 2016-06-22 DIAGNOSIS — I5040 Unspecified combined systolic (congestive) and diastolic (congestive) heart failure: Secondary | ICD-10-CM | POA: Diagnosis not present

## 2016-06-22 DIAGNOSIS — J9690 Respiratory failure, unspecified, unspecified whether with hypoxia or hypercapnia: Secondary | ICD-10-CM | POA: Diagnosis not present

## 2016-06-22 DIAGNOSIS — J9600 Acute respiratory failure, unspecified whether with hypoxia or hypercapnia: Secondary | ICD-10-CM | POA: Diagnosis not present

## 2016-06-22 DIAGNOSIS — R911 Solitary pulmonary nodule: Secondary | ICD-10-CM | POA: Diagnosis not present

## 2016-06-23 DIAGNOSIS — F0280 Dementia in other diseases classified elsewhere without behavioral disturbance: Secondary | ICD-10-CM | POA: Diagnosis not present

## 2016-06-23 DIAGNOSIS — S73005D Unspecified dislocation of left hip, subsequent encounter: Secondary | ICD-10-CM | POA: Diagnosis not present

## 2016-06-23 DIAGNOSIS — E0844 Diabetes mellitus due to underlying condition with diabetic amyotrophy: Secondary | ICD-10-CM | POA: Diagnosis not present

## 2016-06-23 DIAGNOSIS — I2510 Atherosclerotic heart disease of native coronary artery without angina pectoris: Secondary | ICD-10-CM | POA: Diagnosis not present

## 2016-06-24 ENCOUNTER — Ambulatory Visit: Payer: PPO | Admitting: Family Medicine

## 2016-06-25 DIAGNOSIS — Z79899 Other long term (current) drug therapy: Secondary | ICD-10-CM | POA: Diagnosis not present

## 2016-06-25 DIAGNOSIS — D518 Other vitamin B12 deficiency anemias: Secondary | ICD-10-CM | POA: Diagnosis not present

## 2016-06-25 DIAGNOSIS — E782 Mixed hyperlipidemia: Secondary | ICD-10-CM | POA: Diagnosis not present

## 2016-06-25 DIAGNOSIS — E119 Type 2 diabetes mellitus without complications: Secondary | ICD-10-CM | POA: Diagnosis not present

## 2016-06-30 DIAGNOSIS — L89150 Pressure ulcer of sacral region, unstageable: Secondary | ICD-10-CM | POA: Diagnosis not present

## 2016-06-30 DIAGNOSIS — E11621 Type 2 diabetes mellitus with foot ulcer: Secondary | ICD-10-CM | POA: Diagnosis not present

## 2016-06-30 DIAGNOSIS — L97519 Non-pressure chronic ulcer of other part of right foot with unspecified severity: Secondary | ICD-10-CM | POA: Diagnosis not present

## 2016-06-30 DIAGNOSIS — L89890 Pressure ulcer of other site, unstageable: Secondary | ICD-10-CM | POA: Diagnosis not present

## 2016-07-01 DIAGNOSIS — E11621 Type 2 diabetes mellitus with foot ulcer: Secondary | ICD-10-CM | POA: Diagnosis not present

## 2016-07-01 DIAGNOSIS — F0280 Dementia in other diseases classified elsewhere without behavioral disturbance: Secondary | ICD-10-CM | POA: Diagnosis not present

## 2016-07-01 DIAGNOSIS — L89150 Pressure ulcer of sacral region, unstageable: Secondary | ICD-10-CM | POA: Diagnosis not present

## 2016-07-01 DIAGNOSIS — E0844 Diabetes mellitus due to underlying condition with diabetic amyotrophy: Secondary | ICD-10-CM | POA: Diagnosis not present

## 2016-07-01 DIAGNOSIS — S73005D Unspecified dislocation of left hip, subsequent encounter: Secondary | ICD-10-CM | POA: Diagnosis not present

## 2016-07-03 DIAGNOSIS — I501 Left ventricular failure: Secondary | ICD-10-CM | POA: Diagnosis not present

## 2016-07-03 DIAGNOSIS — R404 Transient alteration of awareness: Secondary | ICD-10-CM | POA: Diagnosis not present

## 2016-07-03 DIAGNOSIS — I469 Cardiac arrest, cause unspecified: Secondary | ICD-10-CM | POA: Diagnosis not present

## 2016-07-03 DIAGNOSIS — R061 Stridor: Secondary | ICD-10-CM | POA: Diagnosis not present

## 2016-07-31 ENCOUNTER — Telehealth: Payer: Self-pay | Admitting: Family Medicine

## 2016-07-31 NOTE — Telephone Encounter (Signed)
Pt's daughter in law called to say Jay [name] passed away on 06-13-24and [name] his wife passed away on the 23rd of this month.  She said our office has called a couple of times in the last few days.   FYI  Thanks Barth Kirks

## 2016-07-31 NOTE — Telephone Encounter (Signed)
Fyi. I do not see messages from anyone calling unless it was through insurance call.-aa

## 2016-08-02 DEATH — deceased

## 2016-09-10 NOTE — Telephone Encounter (Signed)
Encounter opened by error. KW 

## 2016-10-29 IMAGING — MR MR HEAD W/O CM
6 of 7 series · 35 of 48 positions shown · non-contrast
Comparison: 07/22/2015 head CT and CT angiogram.

CLINICAL DATA: 69-year-old diabetic hypertensive male with altered
mental status. Initial evaluation revealed hyperglycemia. Subsequent
encounter.

EXAM:
MRI HEAD WITHOUT CONTRAST
TECHNIQUE: Multiplanar, multiecho pulse sequences of the brain and surrounding
structures were obtained without intravenous contrast.

[Series 3: DWI · axial · 3.0mm · 1.09mm/px · z∈[-46,+90]mm · 14 of 94 slices shown (1 of 3)]
[im 1/94]
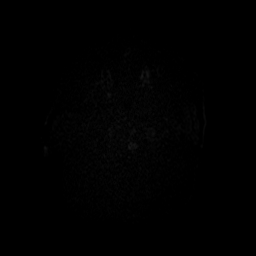
[im 8/94]
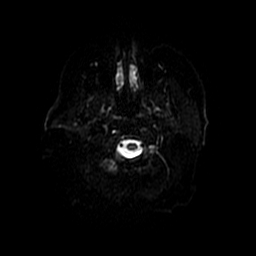
[im 15/94]
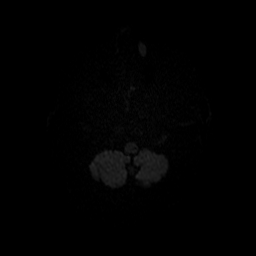
[im 22/94]
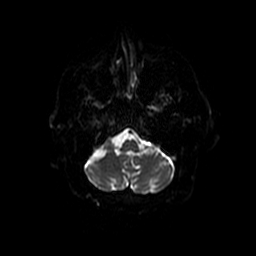
[im 29/94]
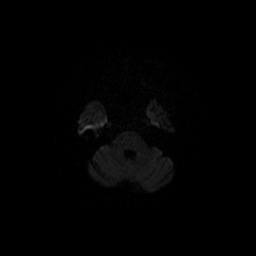
[im 36/94]
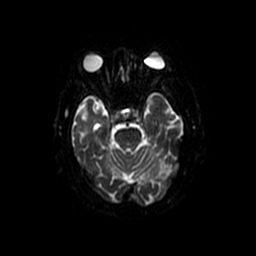
[im 43/94]
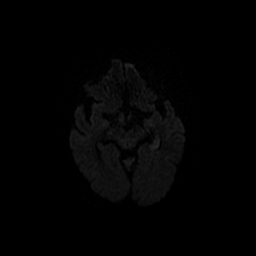
[im 51/94]
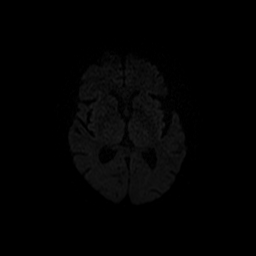
[im 58/94]
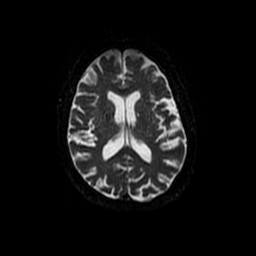
[im 65/94]
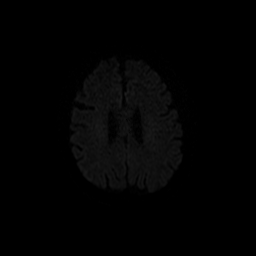
[im 72/94]
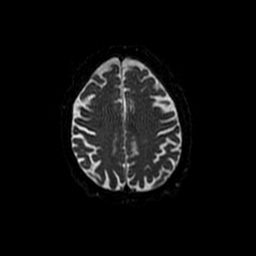
[im 79/94]
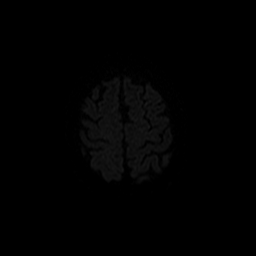
[im 86/94]
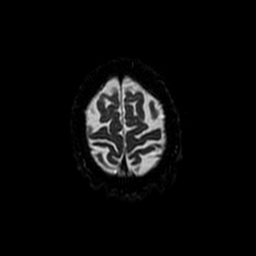
[im 94/94]
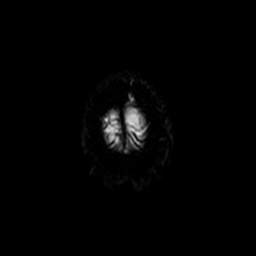

[Series 4: T2 · axial · 5.0mm · 0.43mm/px · z∈[-47,+89]mm · 3 of 24 slices shown]
[im 1/24]
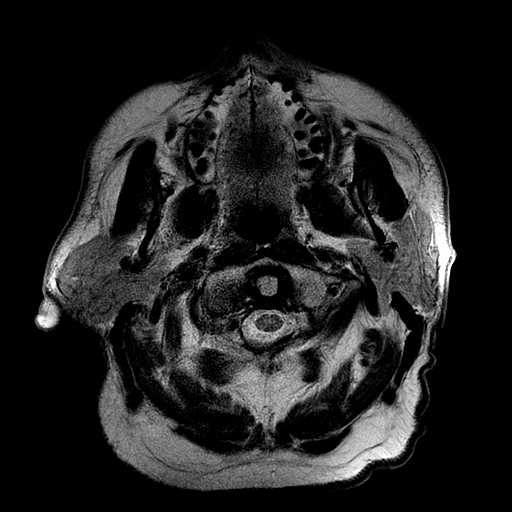
[im 12/24]
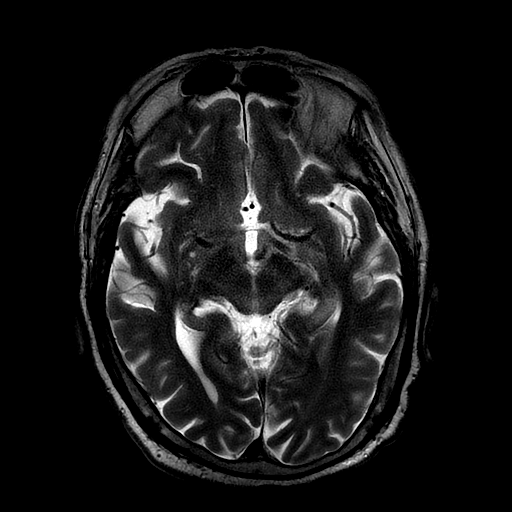
[im 24/24]
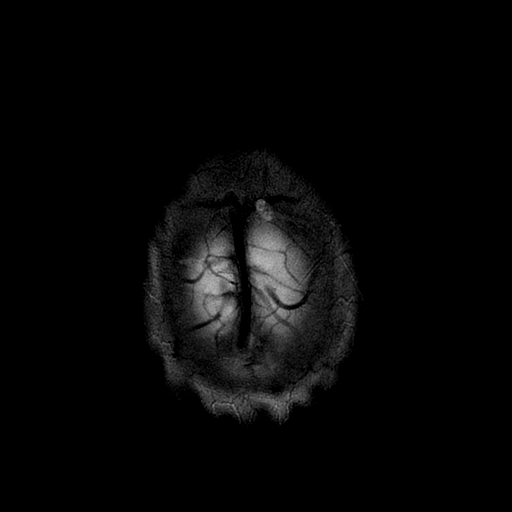

[Series 5: FLAIR · axial · 5.0mm · 0.43mm/px · z∈[-47,+89]mm · 3 of 24 slices shown]
[im 1/24]
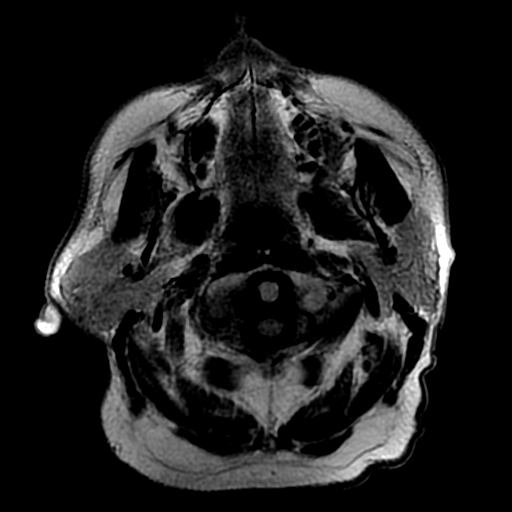
[im 12/24]
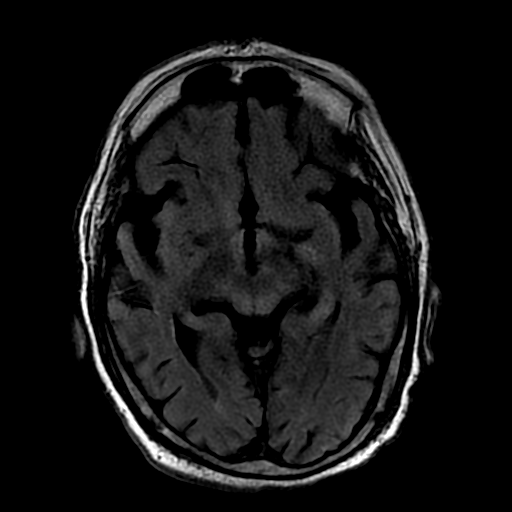
[im 24/24]
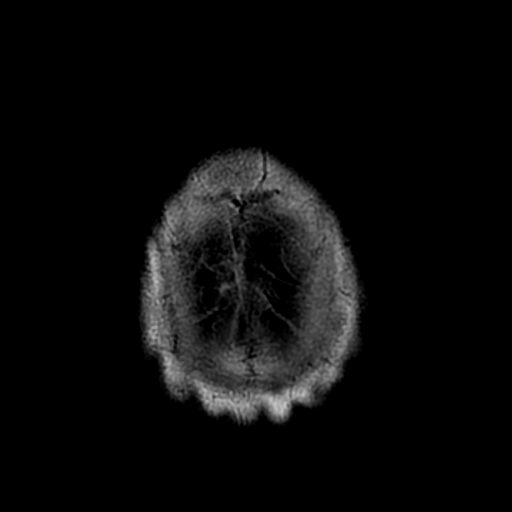

[Series 6: ax mpgr · axial · 5.0mm · 0.43mm/px · z∈[-47,+89]mm · 3 of 24 slices shown]
[im 1/24]
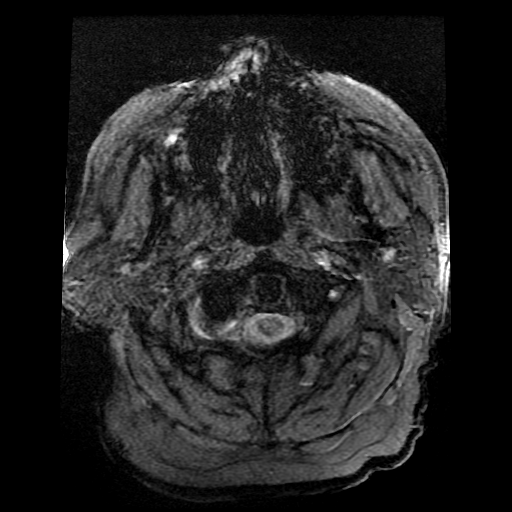
[im 12/24]
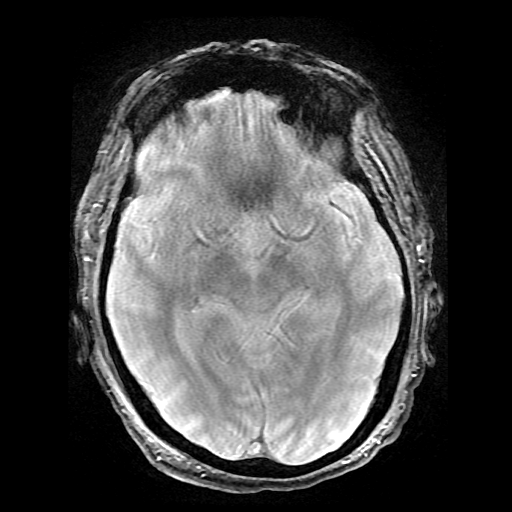
[im 24/24]
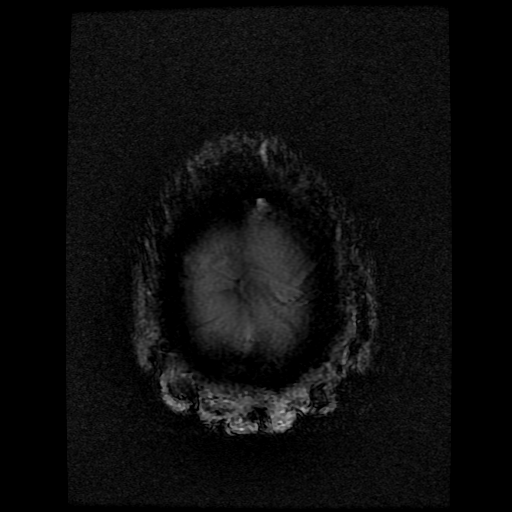

[Series 8: DWI · coronal · 5.0mm · 1.09mm/px · 5 of 33 slices shown (2 of 3)]
[im 1/33]
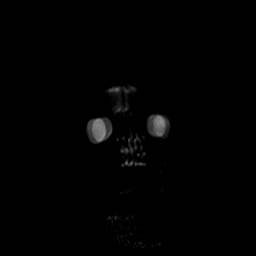
[im 9/33]
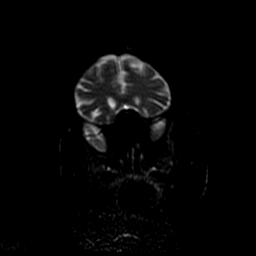
[im 17/33]
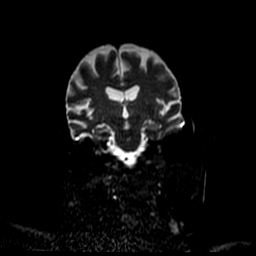
[im 25/33]
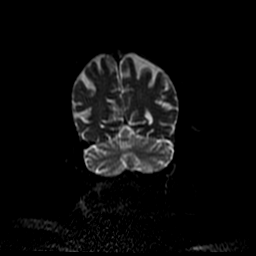
[im 33/33]
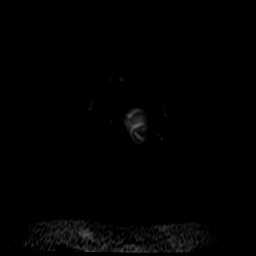

[Series 300: DWI · axial · 3.0mm · 1.09mm/px · z∈[-46,+90]mm · 7 of 47 slices shown (3 of 3)]
[im 1/47]
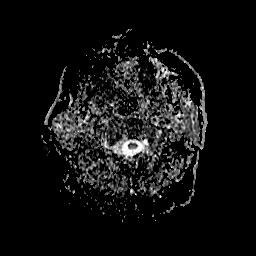
[im 8/47]
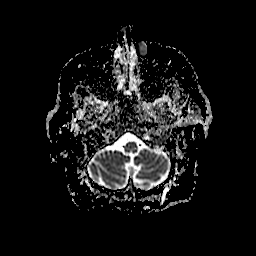
[im 16/47]
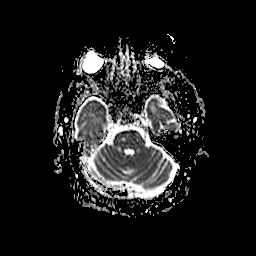
[im 24/47]
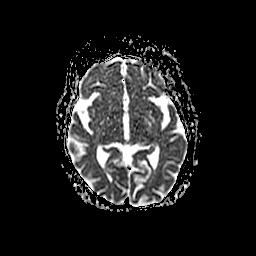
[im 31/47]
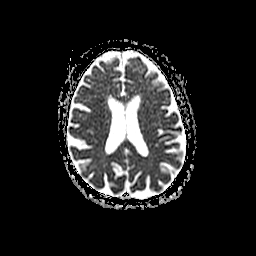
[im 39/47]
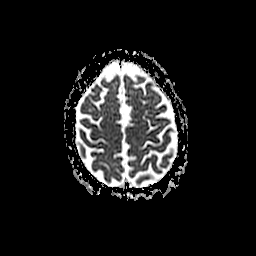
[im 47/47]
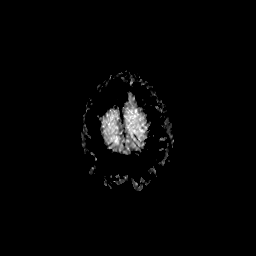

[35 of 48 positions shown; findings below may reference images not displayed]

FINDINGS: Patient was not able to complete the entire examination.

Altered signal intensity of medial aspect of the left temporal lobe
involving hippocampus and amygdala is a nonspecific finding. Herpes
encephalitis needs to be considered and excluded based on clinical
results. Seizure activity can cause a similar appearance. Result of
metabolic abnormality (elevated or depressed glucose levels) also a
consideration. This does not appear be an acute infarct.

Remote very small thalamic infarcts and right paracentral pontine
infarct.

Mild chronic nonspecific white matter changes.

Mild to moderate global atrophy without hydrocephalus.

No intracranial hemorrhage.

No intracranial mass lesion noted on this unenhanced exam.

Major intracranial vascular structures are patent.

Mild mucosal thickening ethmoid sinus air cells.

Post lens replacement without acute abnormality noted.
IMPRESSION: Patient was not able to complete the entire examination.

Altered signal intensity of medial aspect of the left temporal lobe
involving hippocampus and amygdala is a nonspecific finding. Herpes
encephalitis needs to be considered and excluded based on clinical
results. Seizure activity can cause a similar appearance. Result of
metabolic abnormality (elevated or depressed glucose levels) also a
consideration. This does not appear to be an acute infarct.

Remote very small thalamic infarcts and right paracentral pontine
infarct.

Mild to moderate global atrophy without hydrocephalus.

These results were called by telephone at the time of interpretation
on 07/23/2015 at [DATE] to Dr. KUANYSH EKATERINA , who verbally
acknowledged these results.

## 2017-06-26 IMAGING — CR DG CHEST 2V
1 series · 2 of 2 positions shown · non-contrast
Comparison: Chest x-ray of March 18, 2016

CLINICAL DATA: Sensation of bloating and being full of fluid with
increase shortness of breath over the past week. History of coronary
artery disease with stent placement, CHF, diabetes, and pedal edema.

EXAM:
CHEST  2 VIEW

[Series 1: w chest pa · 0.14mm/px · 2 of 2 slices shown]
[im 1/2]
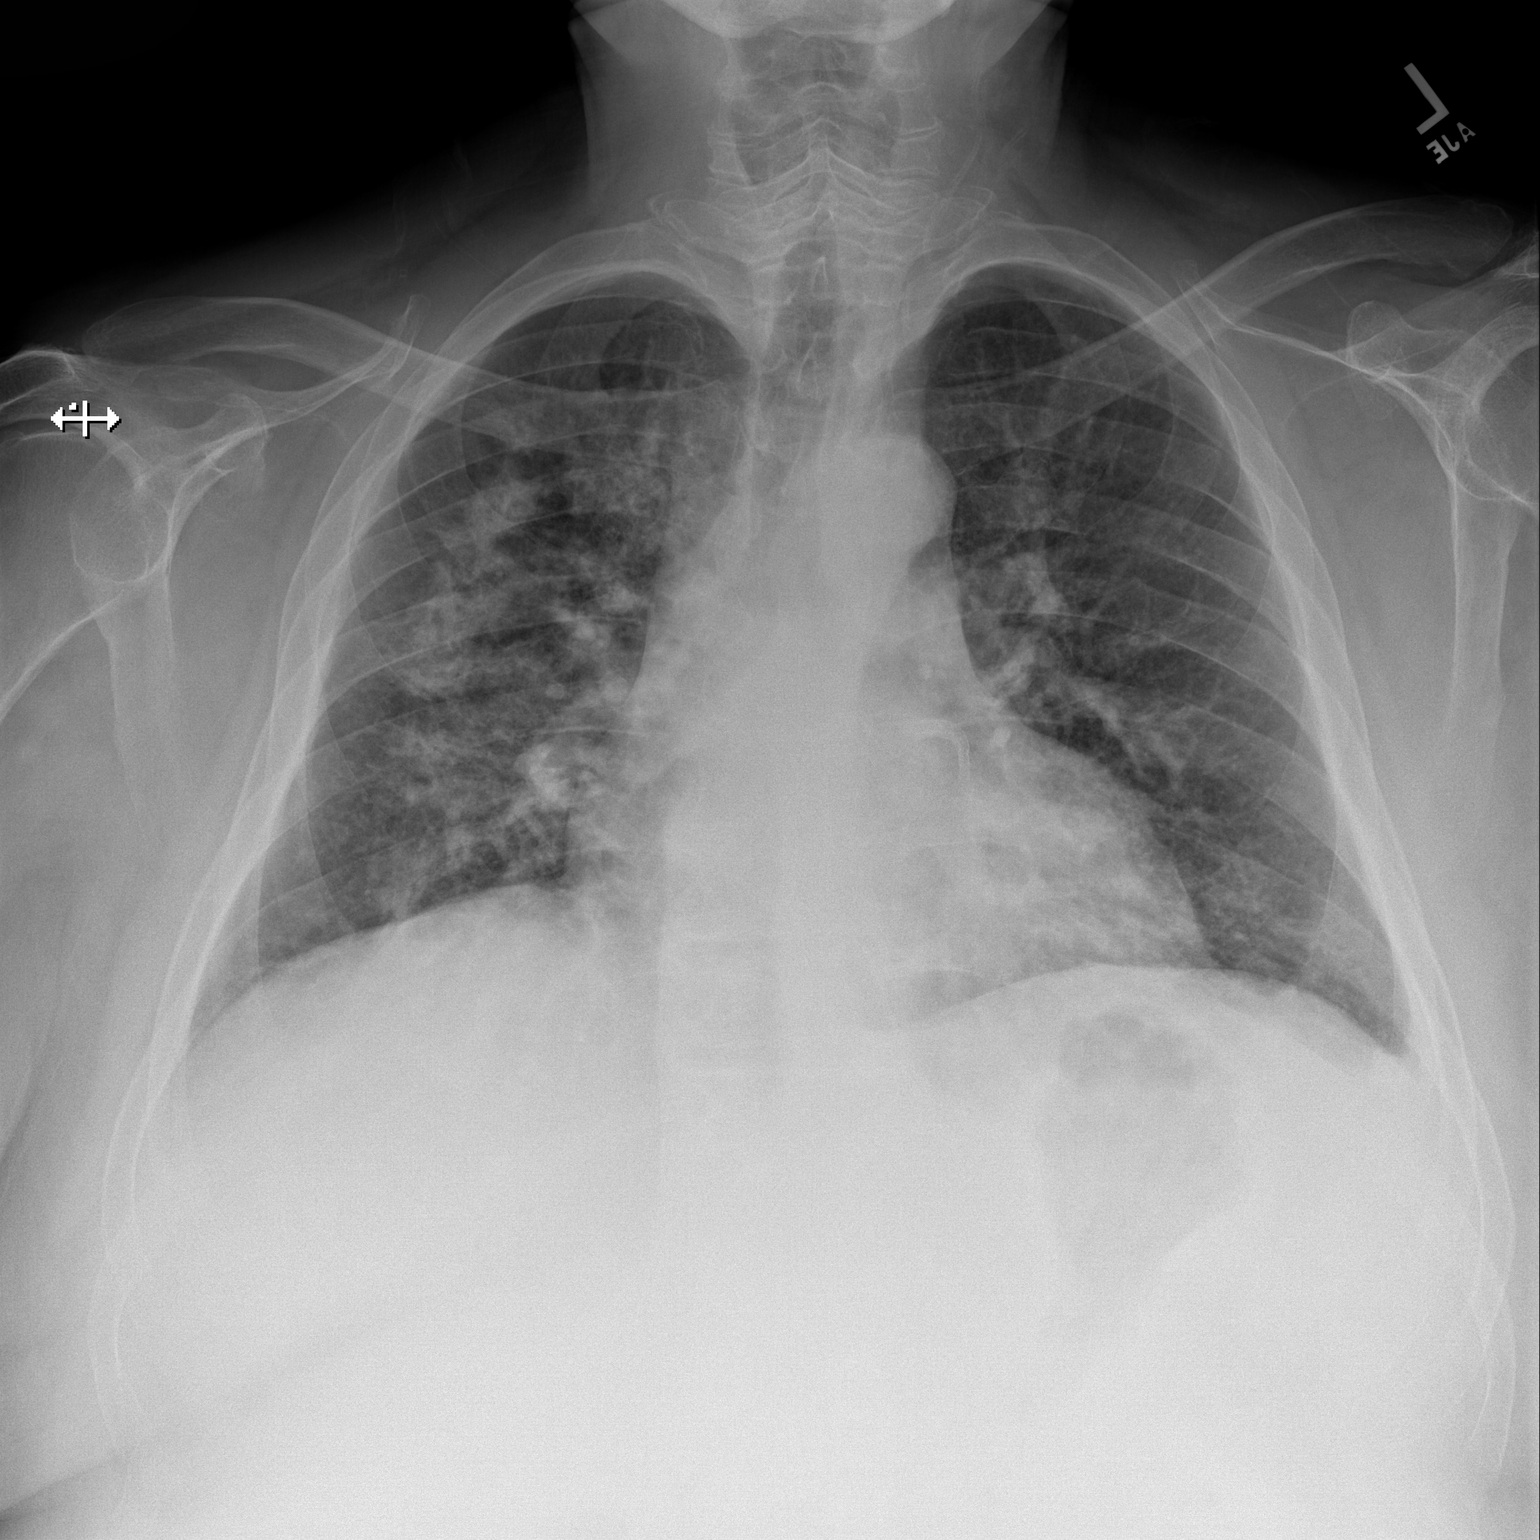
[im 2/2]
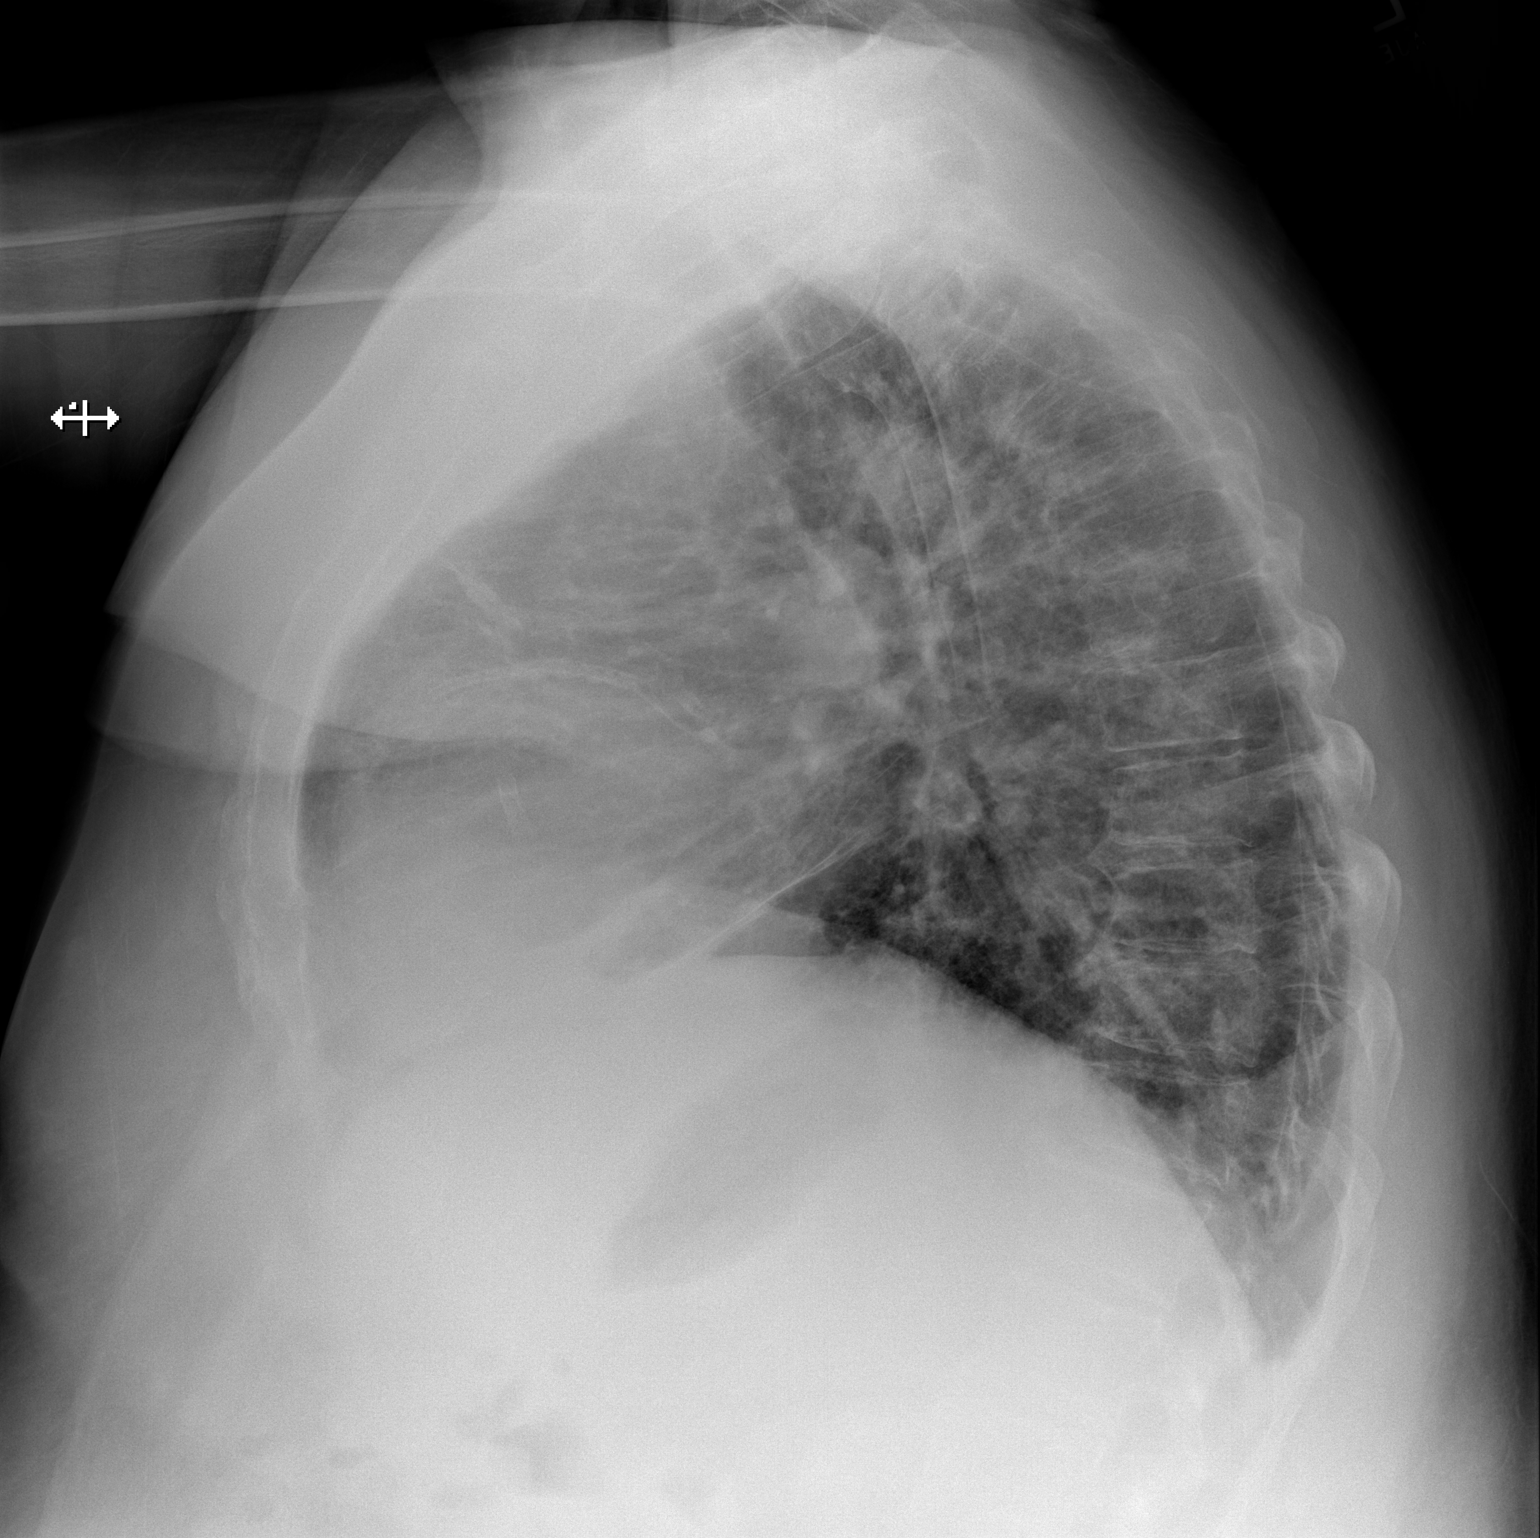

[2 of 2 positions shown; findings below may reference images not displayed]

FINDINGS: Again demonstrated is alveolar opacity in the right lung. The
perihilar lung markings on the left remain increased as well. There
is no pleural effusion. The cardiac silhouette is enlarged. The
central pulmonary vascularity is engorged and there is mild
cephalization. Coronary artery stents are visible. The thoracic
spine exhibits multilevel degenerative disc disease.
IMPRESSION: Findings compatible with CHF with interstitial and alveolar edema.
There has not been significant change since yesterday's study. No
pleural effusion is observed.

## 2017-08-08 IMAGING — CR DG CHEST 2V
1 series · 2 of 2 positions shown · non-contrast
Comparison: 04/07/2016

CLINICAL DATA: Edema, shortness of Breath

EXAM:
CHEST  2 VIEW

[Series 1: dg chest 2 view · 0.14mm/px · 2 of 2 slices shown]
[im 1/2]
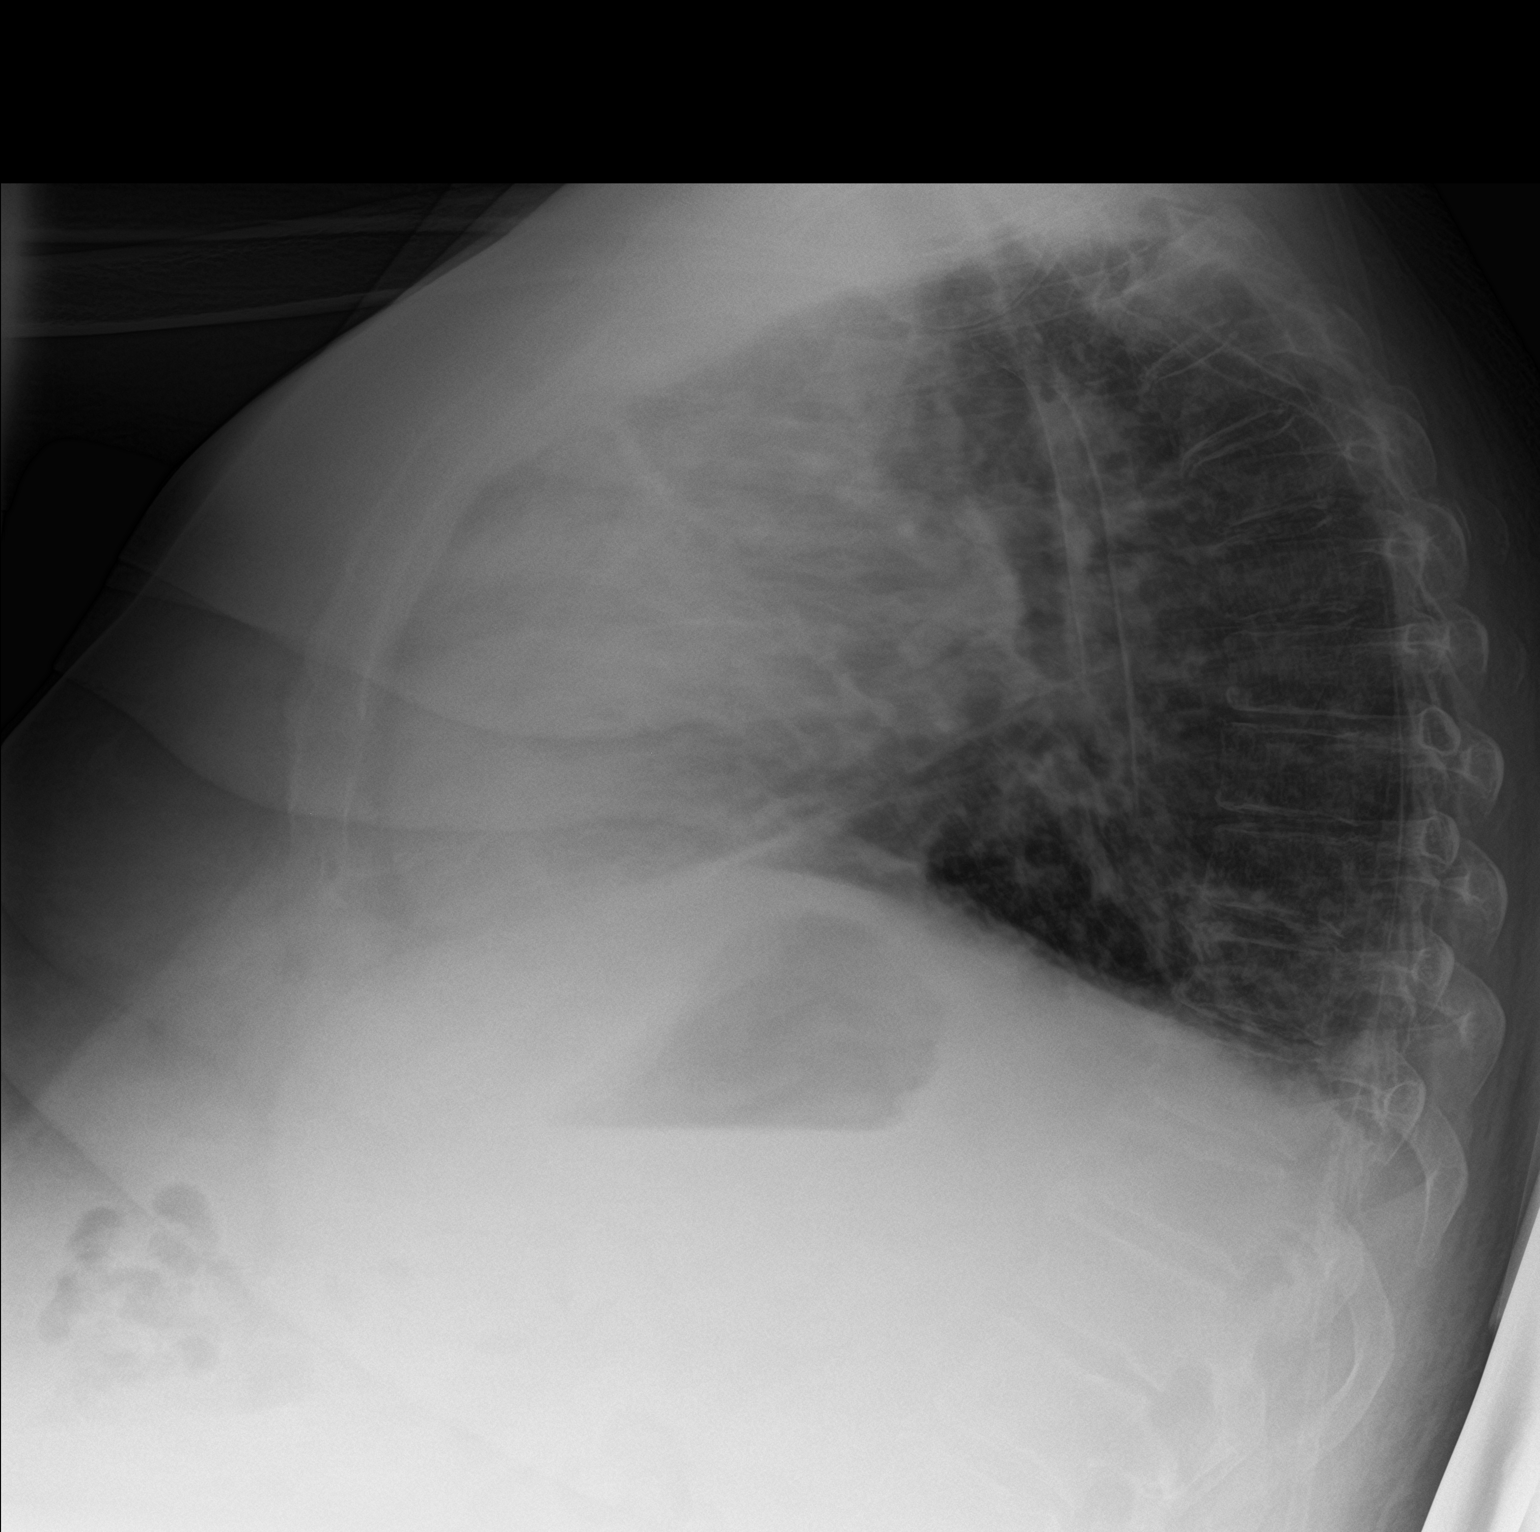
[im 2/2]
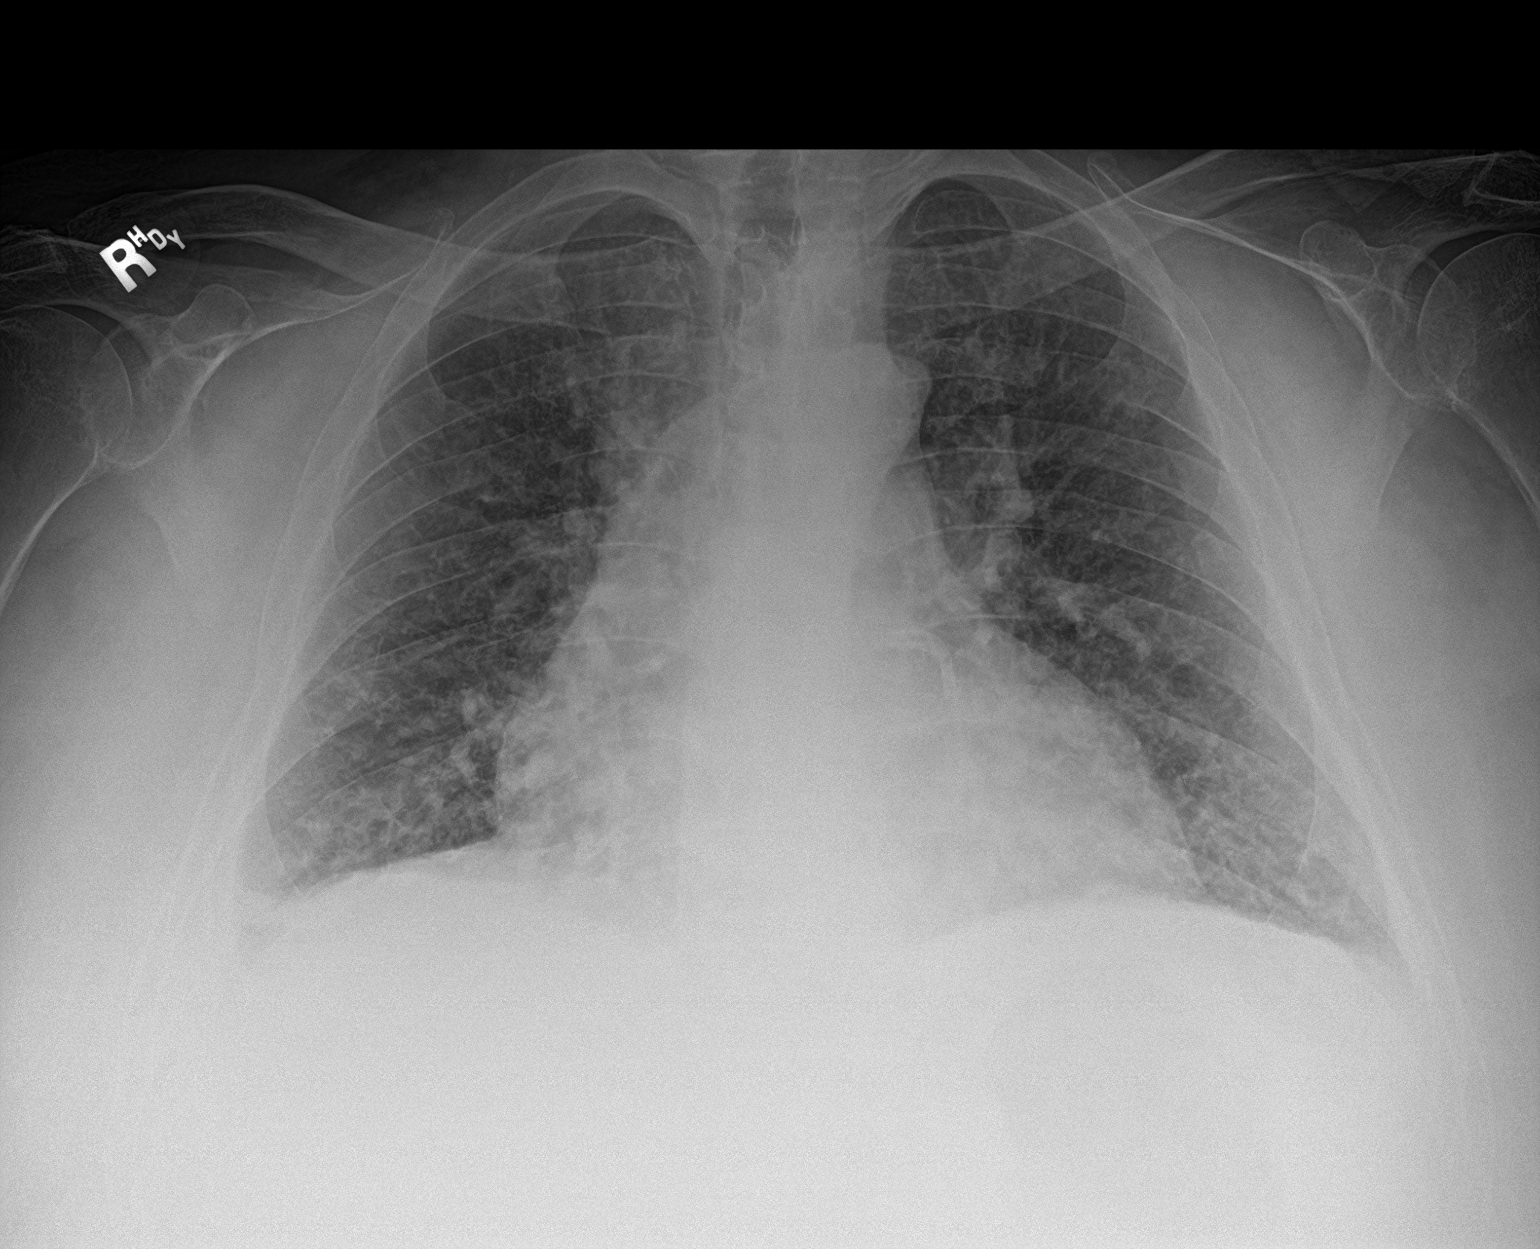

[2 of 2 positions shown; findings below may reference images not displayed]

FINDINGS: Cardiomegaly again noted. There is mild interstitial prominence
bilaterally suspicious for pulmonary edema or pneumonitis. Mild
basilar atelectasis. No segmental infiltrate.
IMPRESSION: Cardiomegaly. Mild interstitial prominence bilaterally suspicious
for interstitial edema or pneumonitis. Mild basilar atelectasis. No
segmental infiltrate.

## 2017-08-09 IMAGING — CR DG CHEST 2V
1 series · 2 of 2 positions shown · non-contrast
Comparison: Yesterday.

CLINICAL DATA: Congestive heart failure.

EXAM:
CHEST  2 VIEW

[Series 1: dg chest 2 view · 0.14mm/px · 2 of 2 slices shown]
[im 1/2]
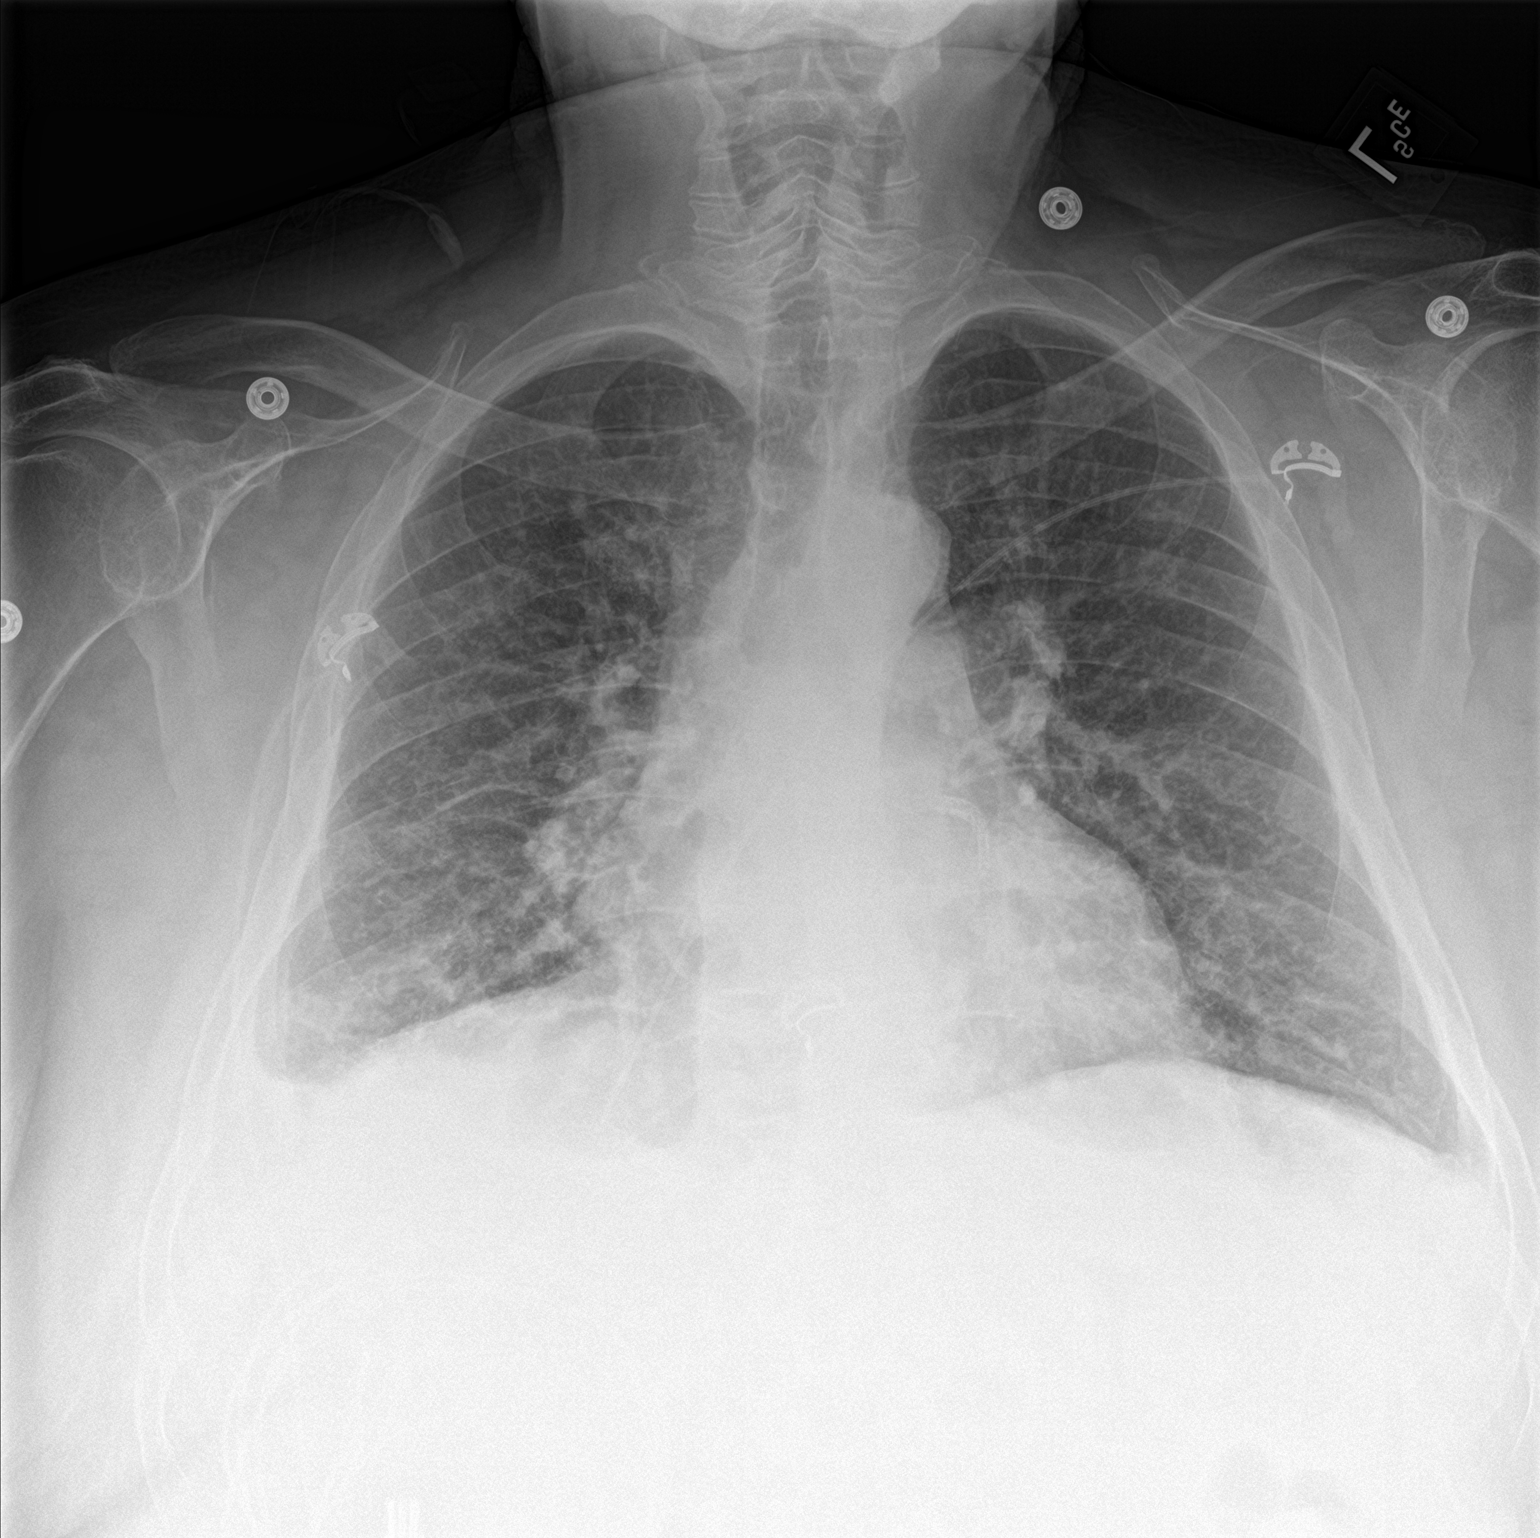
[im 2/2]
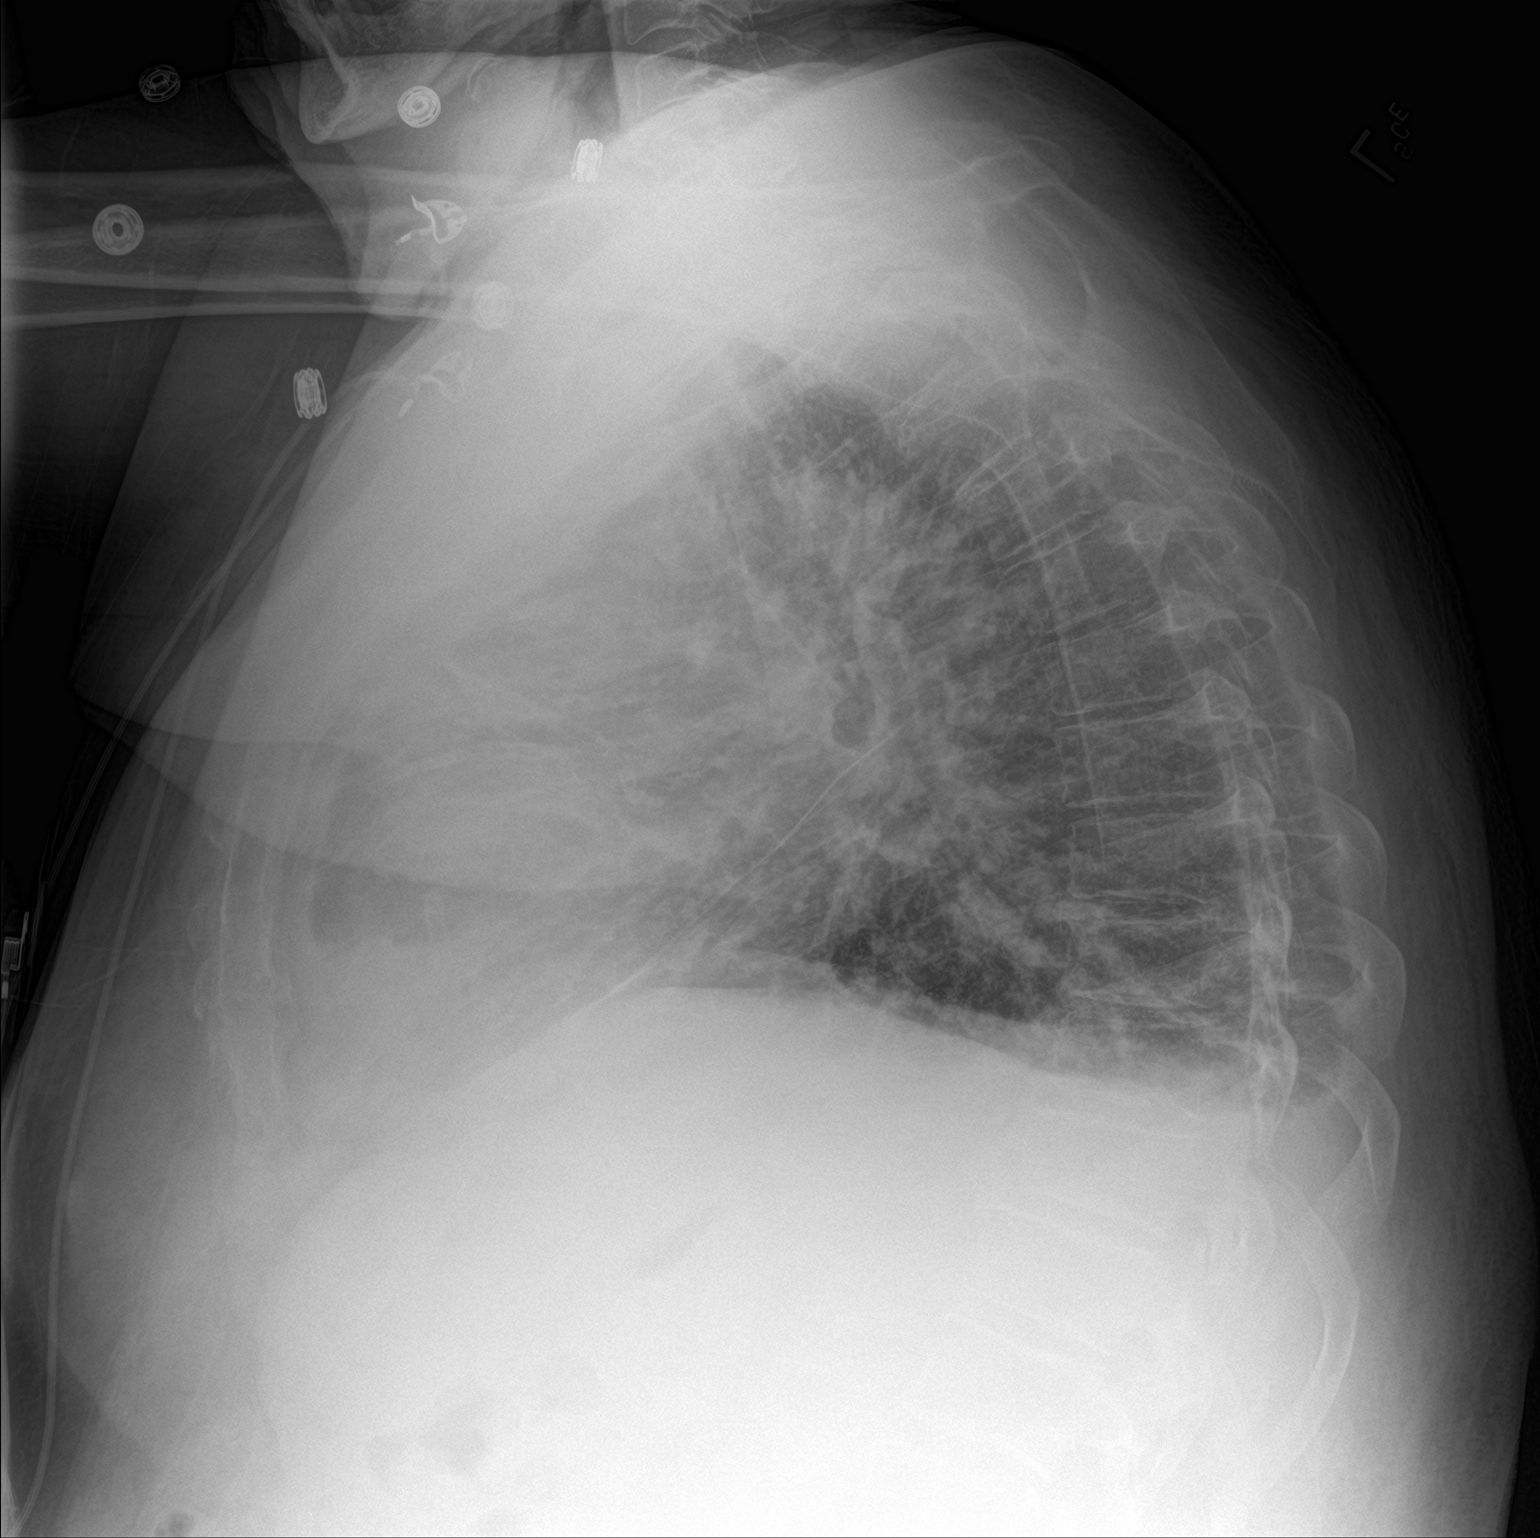

[2 of 2 positions shown; findings below may reference images not displayed]

FINDINGS: Normal sized heart with an interval decrease in size. Coronary
artery stent is noted. Stable prominence of the pulmonary
vasculature and interstitial markings with small bilateral pleural
effusions. Diffuse osteopenia.
IMPRESSION: Improved cardiomegaly with stable changes of congestive heart
failure.
